# Patient Record
Sex: Female | Born: 1945 | Race: White | Hispanic: No | Marital: Married | Smoking: Never smoker
Health system: Southern US, Community
[De-identification: ages and names within clinical notes are randomized; demographics above are authoritative.]

## PROBLEM LIST (undated history)

## (undated) DIAGNOSIS — K5790 Diverticulosis of intestine, part unspecified, without perforation or abscess without bleeding: Secondary | ICD-10-CM

## (undated) DIAGNOSIS — I10 Essential (primary) hypertension: Secondary | ICD-10-CM

## (undated) DIAGNOSIS — M1990 Unspecified osteoarthritis, unspecified site: Secondary | ICD-10-CM

## (undated) DIAGNOSIS — R011 Cardiac murmur, unspecified: Secondary | ICD-10-CM

## (undated) DIAGNOSIS — K219 Gastro-esophageal reflux disease without esophagitis: Secondary | ICD-10-CM

## (undated) HISTORY — PX: KNEE ARTHROSCOPY: SHX127

## (undated) HISTORY — DX: Gastro-esophageal reflux disease without esophagitis: K21.9

## (undated) HISTORY — DX: Unspecified osteoarthritis, unspecified site: M19.90

## (undated) HISTORY — PX: DILATION AND CURETTAGE OF UTERUS: SHX78

## (undated) HISTORY — DX: Cardiac murmur, unspecified: R01.1

## (undated) HISTORY — DX: Essential (primary) hypertension: I10

## (undated) HISTORY — PX: BILATERAL CARPAL TUNNEL RELEASE: SHX6508

## (undated) HISTORY — PX: TONSILLECTOMY: SUR1361

---

## 2004-04-25 HISTORY — PX: ROTATOR CUFF REPAIR: SHX139

## 2004-09-14 ENCOUNTER — Ambulatory Visit: Payer: Self-pay | Admitting: Specialist

## 2004-11-15 ENCOUNTER — Ambulatory Visit: Payer: Self-pay | Admitting: Orthopedic Surgery

## 2004-12-16 ENCOUNTER — Ambulatory Visit: Payer: Self-pay | Admitting: Internal Medicine

## 2005-04-25 HISTORY — PX: FRACTURE SURGERY: SHX138

## 2005-07-15 ENCOUNTER — Emergency Department: Payer: Self-pay | Admitting: Emergency Medicine

## 2005-07-25 ENCOUNTER — Inpatient Hospital Stay: Payer: Self-pay | Admitting: Specialist

## 2005-07-25 ENCOUNTER — Other Ambulatory Visit: Payer: Self-pay

## 2005-10-30 ENCOUNTER — Emergency Department: Payer: Self-pay | Admitting: Emergency Medicine

## 2005-10-30 ENCOUNTER — Other Ambulatory Visit: Payer: Self-pay

## 2005-12-30 ENCOUNTER — Ambulatory Visit: Payer: Self-pay | Admitting: Specialist

## 2006-01-03 ENCOUNTER — Ambulatory Visit: Payer: Self-pay | Admitting: Internal Medicine

## 2006-02-01 ENCOUNTER — Ambulatory Visit: Payer: Self-pay | Admitting: Internal Medicine

## 2006-04-20 ENCOUNTER — Ambulatory Visit: Payer: Self-pay | Admitting: Specialist

## 2006-07-12 ENCOUNTER — Emergency Department: Payer: Self-pay | Admitting: Emergency Medicine

## 2007-02-06 ENCOUNTER — Ambulatory Visit: Payer: Self-pay | Admitting: Internal Medicine

## 2007-03-31 ENCOUNTER — Emergency Department: Payer: Self-pay | Admitting: Emergency Medicine

## 2007-03-31 ENCOUNTER — Other Ambulatory Visit: Payer: Self-pay

## 2007-11-05 ENCOUNTER — Ambulatory Visit: Payer: Self-pay | Admitting: Gastroenterology

## 2008-02-07 ENCOUNTER — Ambulatory Visit: Payer: Self-pay | Admitting: Internal Medicine

## 2008-09-22 ENCOUNTER — Ambulatory Visit: Payer: Self-pay | Admitting: Specialist

## 2008-09-30 ENCOUNTER — Ambulatory Visit: Payer: Self-pay | Admitting: Specialist

## 2008-10-09 ENCOUNTER — Ambulatory Visit: Payer: Self-pay | Admitting: Specialist

## 2009-02-09 ENCOUNTER — Ambulatory Visit: Payer: Self-pay | Admitting: Internal Medicine

## 2010-02-11 ENCOUNTER — Ambulatory Visit: Payer: Self-pay | Admitting: Internal Medicine

## 2010-02-15 ENCOUNTER — Ambulatory Visit: Payer: Self-pay

## 2010-02-17 ENCOUNTER — Ambulatory Visit: Payer: Self-pay | Admitting: Podiatry

## 2011-03-15 ENCOUNTER — Ambulatory Visit: Payer: Self-pay | Admitting: Internal Medicine

## 2012-03-15 ENCOUNTER — Ambulatory Visit: Payer: Self-pay | Admitting: Internal Medicine

## 2012-12-18 ENCOUNTER — Ambulatory Visit: Payer: Self-pay | Admitting: Gastroenterology

## 2013-03-18 ENCOUNTER — Ambulatory Visit: Payer: Self-pay | Admitting: Internal Medicine

## 2013-08-21 ENCOUNTER — Emergency Department: Payer: Self-pay | Admitting: Emergency Medicine

## 2013-08-21 LAB — BASIC METABOLIC PANEL
ANION GAP: 8 (ref 7–16)
BUN: 15 mg/dL (ref 7–18)
CHLORIDE: 103 mmol/L (ref 98–107)
Calcium, Total: 9.6 mg/dL (ref 8.5–10.1)
Co2: 28 mmol/L (ref 21–32)
Creatinine: 0.65 mg/dL (ref 0.60–1.30)
EGFR (Non-African Amer.): >60
GLUCOSE: 114 mg/dL — AB (ref 65–99)
Osmolality: 279 (ref 275–301)
POTASSIUM: 3.4 mmol/L — AB (ref 3.5–5.1)
Sodium: 139 mmol/L (ref 136–145)

## 2013-08-21 LAB — URINALYSIS, COMPLETE
BLOOD: NEGATIVE
Bacteria: NONE SEEN
Bilirubin,UR: NEGATIVE
Glucose,UR: NEGATIVE mg/dL (ref 0–75)
Hyaline Cast: 3
KETONE: NEGATIVE
Leukocyte Esterase: NEGATIVE
NITRITE: NEGATIVE
Ph: 5 (ref 4.5–8.0)
Protein: NEGATIVE
Specific Gravity: 1.015 (ref 1.003–1.030)

## 2013-08-21 LAB — CBC WITH DIFFERENTIAL/PLATELET
Comment - H1-Com1: NORMAL
Comment - H1-Com2: NORMAL
HCT: 42.2 % (ref 35.0–47.0)
HGB: 14.1 g/dL (ref 12.0–16.0)
LYMPHS PCT: 16 %
MCH: 29.4 pg (ref 26.0–34.0)
MCHC: 33.4 g/dL (ref 32.0–36.0)
MCV: 88 fL (ref 80–100)
MONOS PCT: 6 %
PLATELETS: 210 10*3/uL (ref 150–440)
RBC: 4.8 10*6/uL (ref 3.80–5.20)
RDW: 13.4 % (ref 11.5–14.5)
SEGMENTED NEUTROPHILS: 78 %
WBC: 11 10*3/uL (ref 3.6–11.0)

## 2013-08-21 LAB — D-DIMER(ARMC): D-Dimer: 671 ng/ml

## 2013-08-21 LAB — TROPONIN I: Troponin-I: <0.02 ng/mL

## 2014-01-29 ENCOUNTER — Ambulatory Visit: Payer: Self-pay | Admitting: Internal Medicine

## 2014-03-24 ENCOUNTER — Ambulatory Visit: Payer: Self-pay | Admitting: Internal Medicine

## 2014-08-04 DIAGNOSIS — E538 Deficiency of other specified B group vitamins: Secondary | ICD-10-CM | POA: Insufficient documentation

## 2014-08-13 ENCOUNTER — Ambulatory Visit
Admit: 2014-08-13 | Disposition: A | Discharge status: Home / Self Care | Payer: Self-pay | Attending: General Practice | Admitting: General Practice

## 2014-08-13 ENCOUNTER — Ambulatory Visit: Payer: Self-pay

## 2014-08-13 LAB — URINALYSIS, COMPLETE
BILIRUBIN, UR: NEGATIVE
Blood: NEGATIVE
Glucose,UR: NEGATIVE mg/dL (ref 0–75)
Ketone: NEGATIVE
Leukocyte Esterase: NEGATIVE
Nitrite: NEGATIVE
Ph: 5 (ref 4.5–8.0)
Protein: NEGATIVE
Specific Gravity: 1.017 (ref 1.003–1.030)

## 2014-08-13 LAB — BASIC METABOLIC PANEL
Anion Gap: 9 (ref 7–16)
BUN: 14 mg/dL
CHLORIDE: 99 mmol/L — AB
CO2: 30 mmol/L
Calcium, Total: 9.4 mg/dL
Creatinine: 0.69 mg/dL
GLUCOSE: 110 mg/dL — AB
Potassium: 3.8 mmol/L
SODIUM: 138 mmol/L

## 2014-08-13 LAB — SEDIMENTATION RATE: ERYTHROCYTE SED RATE: 49 mm/h — AB (ref 0–30)

## 2014-08-13 LAB — APTT: ACTIVATED PTT: 32 s (ref 23.6–35.9)

## 2014-08-13 LAB — CBC
HCT: 38 % (ref 35.0–47.0)
HGB: 12.8 g/dL (ref 12.0–16.0)
MCH: 28.8 pg (ref 26.0–34.0)
MCHC: 33.7 g/dL (ref 32.0–36.0)
MCV: 85 fL (ref 80–100)
Platelet: 267 10*3/uL (ref 150–440)
RBC: 4.45 10*6/uL (ref 3.80–5.20)
RDW: 13.7 % (ref 11.5–14.5)
WBC: 8.3 10*3/uL (ref 3.6–11.0)

## 2014-08-13 LAB — MRSA PCR SCREENING

## 2014-08-13 LAB — PROTIME-INR
INR: 0.9
Prothrombin Time: 12.8 secs

## 2014-08-14 LAB — URINE CULTURE

## 2014-08-24 ENCOUNTER — Ambulatory Visit: Payer: Self-pay | Admitting: Orthopedic Surgery

## 2014-08-24 HISTORY — PX: JOINT REPLACEMENT: SHX530

## 2014-08-24 MED ORDER — CLINDAMYCIN PHOSPHATE 900 MG/50ML IV SOLN: 900.0000 mg | INTRAVENOUS | Status: DC

## 2014-08-24 MED ORDER — TRANEXAMIC ACID 1000 MG/10ML IV SOLN
1000.0000 mg | INTRAVENOUS | Status: DC
  Filled 2014-08-24: qty 10

## 2014-08-25 ENCOUNTER — Encounter
Admission: RE | Disposition: A | Discharge status: Home / Self Care | Payer: Commercial Managed Care - HMO | Source: Ambulatory Visit | Attending: Orthopedic Surgery

## 2014-08-25 ENCOUNTER — Inpatient Hospital Stay: Payer: Commercial Managed Care - HMO

## 2014-08-25 ENCOUNTER — Ambulatory Visit: Payer: Self-pay | Admitting: Orthopedic Surgery

## 2014-08-25 ENCOUNTER — Inpatient Hospital Stay: Payer: Commercial Managed Care - HMO | Admitting: Anesthesiology

## 2014-08-25 ENCOUNTER — Encounter: Payer: Self-pay | Admitting: *Deleted

## 2014-08-25 ENCOUNTER — Inpatient Hospital Stay
Admission: RE | Admit: 2014-08-25 | Discharge: 2014-08-28 | DRG: 470 | Disposition: A | Discharge status: Home / Self Care | Payer: Commercial Managed Care - HMO | Source: Ambulatory Visit | Attending: Orthopedic Surgery | Admitting: Orthopedic Surgery

## 2014-08-25 DIAGNOSIS — I10 Essential (primary) hypertension: Secondary | ICD-10-CM | POA: Diagnosis present

## 2014-08-25 DIAGNOSIS — Z48812 Encounter for surgical aftercare following surgery on the circulatory system: Secondary | ICD-10-CM

## 2014-08-25 DIAGNOSIS — R011 Cardiac murmur, unspecified: Secondary | ICD-10-CM | POA: Diagnosis present

## 2014-08-25 DIAGNOSIS — Z888 Allergy status to other drugs, medicaments and biological substances status: Secondary | ICD-10-CM

## 2014-08-25 DIAGNOSIS — Z79899 Other long term (current) drug therapy: Secondary | ICD-10-CM

## 2014-08-25 DIAGNOSIS — E785 Hyperlipidemia, unspecified: Secondary | ICD-10-CM | POA: Diagnosis present

## 2014-08-25 DIAGNOSIS — E010 Iodine-deficiency related diffuse (endemic) goiter: Secondary | ICD-10-CM | POA: Diagnosis present

## 2014-08-25 DIAGNOSIS — Z8711 Personal history of peptic ulcer disease: Secondary | ICD-10-CM | POA: Diagnosis not present

## 2014-08-25 DIAGNOSIS — Z96651 Presence of right artificial knee joint: Secondary | ICD-10-CM

## 2014-08-25 DIAGNOSIS — M1711 Unilateral primary osteoarthritis, right knee: Secondary | ICD-10-CM | POA: Diagnosis present

## 2014-08-25 DIAGNOSIS — J3089 Other allergic rhinitis: Secondary | ICD-10-CM | POA: Diagnosis present

## 2014-08-25 DIAGNOSIS — Z7982 Long term (current) use of aspirin: Secondary | ICD-10-CM | POA: Diagnosis not present

## 2014-08-25 DIAGNOSIS — K219 Gastro-esophageal reflux disease without esophagitis: Secondary | ICD-10-CM | POA: Diagnosis present

## 2014-08-25 DIAGNOSIS — J45909 Unspecified asthma, uncomplicated: Secondary | ICD-10-CM | POA: Diagnosis present

## 2014-08-25 DIAGNOSIS — Z880 Allergy status to penicillin: Secondary | ICD-10-CM | POA: Diagnosis not present

## 2014-08-25 DIAGNOSIS — M179 Osteoarthritis of knee, unspecified: Secondary | ICD-10-CM | POA: Diagnosis present

## 2014-08-25 DIAGNOSIS — Z833 Family history of diabetes mellitus: Secondary | ICD-10-CM

## 2014-08-25 DIAGNOSIS — Z780 Asymptomatic menopausal state: Secondary | ICD-10-CM | POA: Diagnosis not present

## 2014-08-25 DIAGNOSIS — Z96659 Presence of unspecified artificial knee joint: Secondary | ICD-10-CM

## 2014-08-25 DIAGNOSIS — R05 Cough: Secondary | ICD-10-CM | POA: Diagnosis present

## 2014-08-25 DIAGNOSIS — Z8249 Family history of ischemic heart disease and other diseases of the circulatory system: Secondary | ICD-10-CM

## 2014-08-25 HISTORY — PX: TOTAL KNEE ARTHROPLASTY: SHX125

## 2014-08-25 SURGERY — ARTHROPLASTY, KNEE, TOTAL
Anesthesia: Spinal | Laterality: Right

## 2014-08-25 MED ORDER — FERROUS SULFATE 325 (65 FE) MG PO TABS
325.0000 mg | ORAL_TABLET | Freq: Two times a day (BID) | ORAL | Status: DC
  Administered 2014-08-26 – 2014-08-28 (×5): 325 mg via ORAL
  Filled 2014-08-25 (×5): qty 1

## 2014-08-25 MED ORDER — MORPHINE SULFATE 2 MG/ML IJ SOLN: 2.0000 mg | INTRAMUSCULAR | Status: DC | PRN

## 2014-08-25 MED ORDER — TRANEXAMIC ACID 1000 MG/10ML IV SOLN: 1000.0000 mg | Freq: Once | INTRAVENOUS | Status: DC

## 2014-08-25 MED ORDER — TETRACAINE HCL 1 % IJ SOLN
INTRAMUSCULAR | Status: DC | PRN
  Administered 2014-08-25: 12 mg via INTRASPINAL

## 2014-08-25 MED ORDER — MAGNESIUM HYDROXIDE 400 MG/5ML PO SUSP
30.0000 mL | Freq: Every day | ORAL | Status: DC | PRN
  Administered 2014-08-26: 30 mL via ORAL
  Filled 2014-08-25: qty 30

## 2014-08-25 MED ORDER — MAGNESIUM HYDROXIDE 400 MG/5ML PO SUSP: 30.0000 mL | Freq: Every day | ORAL | Status: DC | PRN

## 2014-08-25 MED ORDER — LORATADINE 10 MG PO TABS: 10.0000 mg | ORAL_TABLET | Freq: Every day | ORAL | Status: DC

## 2014-08-25 MED ORDER — PHENOL 1.4 % MT LIQD: 1.0000 | OROMUCOSAL | Status: DC | PRN

## 2014-08-25 MED ORDER — ACETAMINOPHEN 325 MG PO TABS
650.0000 mg | ORAL_TABLET | Freq: Four times a day (QID) | ORAL | Status: DC | PRN
  Administered 2014-08-27: 650 mg via ORAL
  Filled 2014-08-25: qty 2

## 2014-08-25 MED ORDER — HYDROMORPHONE HCL 1 MG/ML IJ SOLN
INTRAMUSCULAR | Status: AC
  Administered 2014-08-25: 0.4 mg
  Filled 2014-08-25: qty 1

## 2014-08-25 MED ORDER — PROPOFOL INFUSION 10 MG/ML OPTIME
INTRAVENOUS | Status: DC | PRN
  Administered 2014-08-25: 100 ug/kg/min via INTRAVENOUS

## 2014-08-25 MED ORDER — ENOXAPARIN SODIUM 30 MG/0.3ML ~~LOC~~ SOLN: 30.0000 mg | Freq: Two times a day (BID) | SUBCUTANEOUS | Status: DC

## 2014-08-25 MED ORDER — SODIUM CHLORIDE 0.9 % IV SOLN: 1000.0000 mg | Freq: Once | INTRAVENOUS | Status: DC

## 2014-08-25 MED ORDER — SENNA 8.6 MG PO TABS
1.0000 | ORAL_TABLET | Freq: Two times a day (BID) | ORAL | Status: DC
  Administered 2014-08-25 – 2014-08-28 (×4): 8.6 mg via ORAL
  Filled 2014-08-25 (×6): qty 1

## 2014-08-25 MED ORDER — CLINDAMYCIN PHOSPHATE 900 MG/50ML IV SOLN: 900.0000 mg | INTRAVENOUS | Status: DC

## 2014-08-25 MED ORDER — SODIUM CHLORIDE 0.9 % IV SOLN
INTRAVENOUS | Status: DC | PRN
  Administered 2014-08-25: 60 mL

## 2014-08-25 MED ORDER — SODIUM CHLORIDE 0.9 % IJ SOLN: INTRAMUSCULAR | Status: DC | PRN

## 2014-08-25 MED ORDER — METOCLOPRAMIDE HCL 5 MG/ML IJ SOLN
10.0000 mg | Freq: Three times a day (TID) | INTRAMUSCULAR | Status: DC
  Administered 2014-08-27: 10 mg via INTRAVENOUS
  Filled 2014-08-25 (×2): qty 2

## 2014-08-25 MED ORDER — METOCLOPRAMIDE HCL 10 MG PO TABS
10.0000 mg | ORAL_TABLET | Freq: Three times a day (TID) | ORAL | Status: DC
  Administered 2014-08-25 – 2014-08-28 (×9): 10 mg via ORAL
  Filled 2014-08-25 (×10): qty 1

## 2014-08-25 MED ORDER — CLINDAMYCIN PHOSPHATE 900 MG/50ML IV SOLN
INTRAVENOUS | Status: AC
  Administered 2014-08-25: 900 mg via INTRAVENOUS
  Filled 2014-08-25: qty 50

## 2014-08-25 MED ORDER — CLINDAMYCIN PHOSPHATE 600 MG/50ML IV SOLN: 600.0000 mg | Freq: Four times a day (QID) | INTRAVENOUS | Status: DC

## 2014-08-25 MED ORDER — HYDROMORPHONE HCL 1 MG/ML IJ SOLN
0.2500 mg | INTRAMUSCULAR | Status: DC | PRN
  Administered 2014-08-25 (×4): 0.25 mg via INTRAVENOUS

## 2014-08-25 MED ORDER — TRAMADOL HCL 50 MG PO TABS
50.0000 mg | ORAL_TABLET | ORAL | Status: DC | PRN
  Administered 2014-08-26 – 2014-08-27 (×2): 50 mg via ORAL
  Filled 2014-08-25 (×2): qty 1

## 2014-08-25 MED ORDER — FENTANYL CITRATE (PF) 100 MCG/2ML IJ SOLN
25.0000 ug | INTRAMUSCULAR | Status: DC | PRN
  Administered 2014-08-25 (×4): 25 ug via INTRAVENOUS

## 2014-08-25 MED ORDER — HYDROMORPHONE HCL 1 MG/ML IJ SOLN
0.5000 mg | INTRAMUSCULAR | Status: DC | PRN
  Administered 2014-08-25: 0.5 mg via INTRAVENOUS

## 2014-08-25 MED ORDER — BUPIVACAINE-EPINEPHRINE 0.25% -1:200000 IJ SOLN
INTRAMUSCULAR | Status: DC | PRN
  Administered 2014-08-25: 30 mL

## 2014-08-25 MED ORDER — ACETAMINOPHEN 650 MG RE SUPP: 650.0000 mg | Freq: Four times a day (QID) | RECTAL | Status: DC | PRN

## 2014-08-25 MED ORDER — FERROUS SULFATE 325 (65 FE) MG PO TABS: 325.0000 mg | ORAL_TABLET | Freq: Two times a day (BID) | ORAL | Status: DC

## 2014-08-25 MED ORDER — SODIUM CHLORIDE 0.9 % IJ SOLN
INTRAMUSCULAR | Status: AC
Start: 2014-08-25 — End: 2014-08-25
  Filled 2014-08-25: qty 3

## 2014-08-25 MED ORDER — ONDANSETRON HCL 4 MG/2ML IJ SOLN: 4.0000 mg | Freq: Once | INTRAMUSCULAR | Status: DC | PRN

## 2014-08-25 MED ORDER — SODIUM CHLORIDE 0.9 % IV SOLN: INTRAVENOUS | Status: DC

## 2014-08-25 MED ORDER — ONDANSETRON HCL 4 MG PO TABS: 4.0000 mg | ORAL_TABLET | Freq: Four times a day (QID) | ORAL | Status: DC | PRN

## 2014-08-25 MED ORDER — BISACODYL 10 MG RE SUPP: 10.0000 mg | Freq: Every day | RECTAL | Status: DC | PRN

## 2014-08-25 MED ORDER — LACTATED RINGERS IV SOLN
INTRAVENOUS | Status: DC
  Administered 2014-08-25 (×2): via INTRAVENOUS

## 2014-08-25 MED ORDER — ACETAMINOPHEN 10 MG/ML IV SOLN
INTRAVENOUS | Status: DC | PRN
  Administered 2014-08-25: 1000 mg via INTRAVENOUS

## 2014-08-25 MED ORDER — ENOXAPARIN SODIUM 30 MG/0.3ML ~~LOC~~ SOLN
30.0000 mg | Freq: Two times a day (BID) | SUBCUTANEOUS | Status: DC
  Administered 2014-08-25 – 2014-08-28 (×6): 30 mg via SUBCUTANEOUS
  Filled 2014-08-25 (×6): qty 0.3

## 2014-08-25 MED ORDER — METOCLOPRAMIDE HCL 5 MG/ML IJ SOLN: 10.0000 mg | Freq: Three times a day (TID) | INTRAMUSCULAR | Status: DC

## 2014-08-25 MED ORDER — FLEET ENEMA 7-19 GM/118ML RE ENEM
1.0000 | ENEMA | Freq: Once | RECTAL | Status: DC | PRN
Start: 2014-08-25 — End: 2014-08-25

## 2014-08-25 MED ORDER — ACETAMINOPHEN 325 MG PO TABS: 650.0000 mg | ORAL_TABLET | Freq: Four times a day (QID) | ORAL | Status: DC | PRN

## 2014-08-25 MED ORDER — ACETAMINOPHEN 10 MG/ML IV SOLN: 1000.0000 mg | Freq: Four times a day (QID) | INTRAVENOUS | Status: DC

## 2014-08-25 MED ORDER — SODIUM CHLORIDE 0.9 % IV SOLN
INTRAVENOUS | Status: DC
  Administered 2014-08-25 – 2014-08-26 (×2): via INTRAVENOUS

## 2014-08-25 MED ORDER — LIDOCAINE HCL (PF) 1 % IJ SOLN
INTRAMUSCULAR | Status: AC
  Filled 2014-08-25: qty 2

## 2014-08-25 MED ORDER — PANTOPRAZOLE SODIUM 40 MG PO TBEC
40.0000 mg | DELAYED_RELEASE_TABLET | Freq: Every day | ORAL | Status: DC
  Administered 2014-08-25 – 2014-08-28 (×4): 40 mg via ORAL
  Filled 2014-08-25 (×4): qty 1

## 2014-08-25 MED ORDER — CALCIUM CITRATE-VITAMIN D 315-200 MG-UNIT PO TABS: 1.0000 | ORAL_TABLET | Freq: Three times a day (TID) | ORAL | Status: DC

## 2014-08-25 MED ORDER — LORATADINE 10 MG PO TABS
10.0000 mg | ORAL_TABLET | Freq: Every day | ORAL | Status: DC
  Administered 2014-08-25 – 2014-08-28 (×4): 10 mg via ORAL
  Filled 2014-08-25 (×4): qty 1

## 2014-08-25 MED ORDER — ALUM & MAG HYDROXIDE-SIMETH 200-200-20 MG/5ML PO SUSP: 30.0000 mL | ORAL | Status: DC | PRN

## 2014-08-25 MED ORDER — MENTHOL 3 MG MT LOZG: 1.0000 | LOZENGE | OROMUCOSAL | Status: DC | PRN

## 2014-08-25 MED ORDER — ACETAMINOPHEN 10 MG/ML IV SOLN
1000.0000 mg | Freq: Four times a day (QID) | INTRAVENOUS | Status: AC
  Administered 2014-08-25 – 2014-08-26 (×4): 1000 mg via INTRAVENOUS
  Filled 2014-08-25 (×6): qty 100

## 2014-08-25 MED ORDER — NEOMYCIN-POLYMYXIN B GU 40-200000 IR SOLN
Status: DC | PRN
  Administered 2014-08-25: 16 mL

## 2014-08-25 MED ORDER — TRANEXAMIC ACID 1000 MG/10ML IV SOLN
1000.0000 mg | INTRAVENOUS | Status: DC | PRN
  Administered 2014-08-25: 1000 mg via INTRAVENOUS

## 2014-08-25 MED ORDER — TRANEXAMIC ACID 1000 MG/10ML IV SOLN
1000.0000 mg | Freq: Once | INTRAVENOUS | Status: AC
  Administered 2014-08-25: 1000 mg via INTRAVENOUS
  Filled 2014-08-25: qty 10

## 2014-08-25 MED ORDER — CHLORHEXIDINE GLUCONATE 4 % EX LIQD
60.0000 mL | Freq: Once | CUTANEOUS | Status: DC
  Filled 2014-08-25: qty 60

## 2014-08-25 MED ORDER — CALCIUM CARBONATE-VITAMIN D 500-200 MG-UNIT PO TABS
1.0000 | ORAL_TABLET | Freq: Three times a day (TID) | ORAL | Status: DC
  Administered 2014-08-25 – 2014-08-28 (×8): 1 via ORAL
  Filled 2014-08-25 (×8): qty 1

## 2014-08-25 MED ORDER — METOCLOPRAMIDE HCL 10 MG PO TABS: 10.0000 mg | ORAL_TABLET | Freq: Three times a day (TID) | ORAL | Status: DC

## 2014-08-25 MED ORDER — CHLORHEXIDINE GLUCONATE 4 % EX LIQD: 60.0000 mL | Freq: Once | CUTANEOUS | Status: DC

## 2014-08-25 MED ORDER — OXYCODONE HCL 5 MG PO TABS
5.0000 mg | ORAL_TABLET | ORAL | Status: DC | PRN
Start: 2014-08-25 — End: 2014-08-25

## 2014-08-25 MED ORDER — ONDANSETRON HCL 4 MG/2ML IJ SOLN
4.0000 mg | Freq: Four times a day (QID) | INTRAMUSCULAR | Status: DC | PRN
  Administered 2014-08-25: 4 mg via INTRAVENOUS
  Filled 2014-08-25: qty 2

## 2014-08-25 MED ORDER — OXYCODONE HCL 5 MG PO TABS
5.0000 mg | ORAL_TABLET | ORAL | Status: DC | PRN
  Administered 2014-08-25 – 2014-08-28 (×7): 5 mg via ORAL
  Filled 2014-08-25 (×6): qty 1
  Filled 2014-08-25: qty 2

## 2014-08-25 MED ORDER — HYDROCHLOROTHIAZIDE 25 MG PO TABS
25.0000 mg | ORAL_TABLET | Freq: Every day | ORAL | Status: DC
Start: 2014-08-25 — End: 2014-08-28
  Administered 2014-08-25 – 2014-08-28 (×4): 25 mg via ORAL
  Filled 2014-08-25 (×4): qty 1

## 2014-08-25 MED ORDER — MECLIZINE HCL 25 MG PO TABS: 25.0000 mg | ORAL_TABLET | Freq: Three times a day (TID) | ORAL | Status: DC | PRN

## 2014-08-25 MED ORDER — TRAMADOL HCL 50 MG PO TABS: 50.0000 mg | ORAL_TABLET | Freq: Four times a day (QID) | ORAL | Status: DC | PRN

## 2014-08-25 MED ORDER — METOPROLOL SUCCINATE ER 25 MG PO TB24
25.0000 mg | ORAL_TABLET | Freq: Every day | ORAL | Status: DC
  Administered 2014-08-25 – 2014-08-28 (×4): 25 mg via ORAL
  Filled 2014-08-25 (×4): qty 1

## 2014-08-25 MED ORDER — ACETAMINOPHEN 10 MG/ML IV SOLN
INTRAVENOUS | Status: DC | PRN
Start: 2014-08-25 — End: 2014-08-25
  Administered 2014-08-25: 1000 mg via INTRAVENOUS

## 2014-08-25 MED ORDER — VITAMIN B-12 500 MCG PO TABS
500.0000 ug | ORAL_TABLET | Freq: Two times a day (BID) | ORAL | Status: DC
  Administered 2014-08-25 – 2014-08-28 (×6): 500 ug via ORAL
  Filled 2014-08-25 (×7): qty 1

## 2014-08-25 MED ORDER — FLAX SEED OIL 1000 MG PO CAPS: 1.0000 | ORAL_CAPSULE | Freq: Three times a day (TID) | ORAL | Status: DC

## 2014-08-25 MED ORDER — ONDANSETRON HCL 4 MG/2ML IJ SOLN: 4.0000 mg | Freq: Four times a day (QID) | INTRAMUSCULAR | Status: DC | PRN

## 2014-08-25 MED ORDER — CLINDAMYCIN PHOSPHATE 900 MG/50ML IV SOLN
900.0000 mg | Freq: Three times a day (TID) | INTRAVENOUS | Status: AC
  Administered 2014-08-25 – 2014-08-26 (×3): 900 mg via INTRAVENOUS
  Filled 2014-08-25 (×5): qty 50

## 2014-08-25 MED ORDER — FLEET ENEMA 7-19 GM/118ML RE ENEM: 1.0000 | ENEMA | Freq: Once | RECTAL | Status: AC | PRN

## 2014-08-25 MED ORDER — SENNA 8.6 MG PO TABS: 1.0000 | ORAL_TABLET | Freq: Two times a day (BID) | ORAL | Status: DC

## 2014-08-25 MED ORDER — PHENOL 1.4 % MT LIQD
1.0000 | OROMUCOSAL | Status: DC | PRN
  Filled 2014-08-25: qty 177

## 2014-08-25 SURGICAL SUPPLY — 63 items
AUTOTRANSFUS HAS 1/8 (MISCELLANEOUS) ×3
BATTERY INSTRU NAVIGATION (MISCELLANEOUS) ×12 IMPLANT
BLADE SAW 1 (BLADE) ×3 IMPLANT
BLADE SAW 1/2 (BLADE) ×3 IMPLANT
BTRY SRG DRVR LF (MISCELLANEOUS) ×4
CANISTER SUCT 1200ML W/VALVE (MISCELLANEOUS) ×3 IMPLANT
CANISTER SUCT 3000ML (MISCELLANEOUS) ×6 IMPLANT
CAP KNEE TOTAL 3 SIGMA ×2 IMPLANT
CATH TRAY 16F METER LATEX (MISCELLANEOUS) ×3 IMPLANT
CEMENT HV SMART SET (Cement) ×6 IMPLANT
COOLER POLAR GLACIER W/PUMP (MISCELLANEOUS) ×3 IMPLANT
DRAPE SHEET LG 3/4 BI-LAMINATE (DRAPES) ×5 IMPLANT
DRSG DERMACEA 8X12 NADH (GAUZE/BANDAGES/DRESSINGS) ×3 IMPLANT
DRSG OPSITE POSTOP 4X10 (GAUZE/BANDAGES/DRESSINGS) ×2 IMPLANT
DRSG OPSITE POSTOP 4X12 (GAUZE/BANDAGES/DRESSINGS) ×3 IMPLANT
DRSG OPSITE POSTOP 4X14 (GAUZE/BANDAGES/DRESSINGS) ×3 IMPLANT
DURAPREP 26ML APPLICATOR (WOUND CARE) ×6 IMPLANT
EX-PIN ORTHOLOCK NAV 4X150 (PIN) ×6 IMPLANT
GLOVE BIOGEL M STRL SZ7.5 (GLOVE) ×6 IMPLANT
GLOVE INDICATOR 8.0 STRL GRN (GLOVE) ×3 IMPLANT
GLOVE SURG 9.0 ORTHO LTXF (GLOVE) ×3 IMPLANT
GLOVE SURG ORTHO 9.0 STRL STRW (GLOVE) ×3 IMPLANT
GOWN STRL REUS W/ TWL LRG LVL3 (GOWN DISPOSABLE) ×1 IMPLANT
GOWN STRL REUS W/TWL 2XL LVL3 (GOWN DISPOSABLE) ×3 IMPLANT
GOWN STRL REUS W/TWL LRG LVL3 (GOWN DISPOSABLE) ×3
GOWN STRL REUS W/TWL XL LVL4 (GOWN DISPOSABLE) ×3 IMPLANT
HANDPIECE SUCTION TUBG SURGILV (MISCELLANEOUS) ×3 IMPLANT
HOLDER FOLEY CATH W/STRAP (MISCELLANEOUS) ×3 IMPLANT
HOOD PEEL AWAY FACE SHEILD DIS (HOOD) ×6 IMPLANT
KNIFE SCULPS 14X20 (INSTRUMENTS) ×3 IMPLANT
NDL SAFETY 18GX1.5 (NEEDLE) ×3 IMPLANT
NDL SPNL 18GX3.5 QUINCKE PK (NEEDLE) ×1 IMPLANT
NDL SPNL 20GX3.5 QUINCKE YW (NEEDLE) ×1 IMPLANT
NEEDLE SPNL 18GX3.5 QUINCKE PK (NEEDLE) ×3 IMPLANT
NEEDLE SPNL 20GX3.5 QUINCKE YW (NEEDLE) ×3 IMPLANT
NS IRRIG 500ML POUR BTL (IV SOLUTION) ×3 IMPLANT
PACK TOTAL KNEE (MISCELLANEOUS) ×3 IMPLANT
PAD CAST CTTN 4X4 STRL (SOFTGOODS) IMPLANT
PAD GROUND ADULT SPLIT (MISCELLANEOUS) ×3 IMPLANT
PAD WRAPON POLAR KNEE (MISCELLANEOUS) ×1 IMPLANT
PADDING CAST COTTON 4X4 STRL (SOFTGOODS) ×3
PIN FIXATION 1/8DIA X 3INL (PIN) ×3 IMPLANT
SOL .9 NS 3000ML IRR  AL (IV SOLUTION) ×2
SOL .9 NS 3000ML IRR AL (IV SOLUTION) ×1
SOL .9 NS 3000ML IRR UROMATIC (IV SOLUTION) ×1 IMPLANT
SOL PREP PVP 2OZ (MISCELLANEOUS) ×3
SOLUTION PREP PVP 2OZ (MISCELLANEOUS) ×1 IMPLANT
SPONGE DRAIN TRACH 4X4 STRL 2S (GAUZE/BANDAGES/DRESSINGS) ×3 IMPLANT
STAPLER SKIN PROX 35W (STAPLE) ×3 IMPLANT
STOCKINETTE BIAS CUT 6 980064 (GAUZE/BANDAGES/DRESSINGS) ×2 IMPLANT
STRAP SAFETY BODY (MISCELLANEOUS) ×3 IMPLANT
SUCTION FRAZIER TIP 10 FR DISP (SUCTIONS) ×3 IMPLANT
SUT VIC AB 0 CT1 36 (SUTURE) ×3 IMPLANT
SUT VIC AB 1 CT1 36 (SUTURE) ×6 IMPLANT
SUT VIC AB 2-0 CT2 27 (SUTURE) ×3 IMPLANT
SYR 20CC LL (SYRINGE) ×3 IMPLANT
SYR 30ML LL (SYRINGE) ×3 IMPLANT
SYR 50ML LL SCALE MARK (SYRINGE) ×3 IMPLANT
SYSTEM AUTOTRANSFUS DUAL TROCR (MISCELLANEOUS) ×1 IMPLANT
TOWEL OR 17X26 4PK STRL BLUE (TOWEL DISPOSABLE) ×3 IMPLANT
TOWER CARTRIDGE SMART MIX (DISPOSABLE) ×3 IMPLANT
WATER STERILE IRR 1000ML POUR (IV SOLUTION) ×3 IMPLANT
WRAPON POLAR PAD KNEE (MISCELLANEOUS) ×3

## 2014-08-25 NOTE — Brief Op Note (Signed)
08/25/2014  3:43 PM  PATIENT:  Nancy York  69 y.o. female  PRE-OPERATIVE DIAGNOSIS:  degenerative arthrosis right knee  POST-OPERATIVE DIAGNOSIS:  degenerative arthrosis right knee  PROCEDURE:  Procedure(s): TOTAL KNEE ARTHROPLASTY (Right)  SURGEON:  Surgeon(s) and Role:    * Donato HeinzJames P Leeba Barbe, MD - Primary  PHYSICIAN ASSISTANT: Van ClinesJon Wolfe, PA   ANESTHESIA:   spinal  EBL:  Total I/O In: 2000 [I.V.:2000] Out: 530 [Urine:430; Blood:100]  BLOOD ADMINISTERED:none  DRAINS: 2 drains to reinfusion system   LOCAL MEDICATIONS USED:  MARCAINE 0.25% with epi 30 ml   Exparel 20 ml in 40 ml of NS     SPECIMEN:  No Specimen  DISPOSITION OF SPECIMEN:  N/A  COUNTS:  YES  TOURNIQUET:   Total Tourniquet Time Documented: Thigh (Right) - 93 minutes Total: Thigh (Right) - 93 minutes   DICTATION: .Reubin Milanragon Dictation  PLAN OF CARE: Admit to inpatient   PATIENT DISPOSITION:  PACU - hemodynamically stable.   Delay start of Pharmacological VTE agent (>24hrs) due to surgical blood loss or risk of bleeding: yes

## 2014-08-25 NOTE — Anesthesia Postprocedure Evaluation (Signed)
  Anesthesia Post-op Note  Patient: Nancy BlakeSandra S York  Procedure(s) Performed: Procedure(s): TOTAL KNEE ARTHROPLASTY (Right)  Anesthesia type:Spinal  Patient location: PACU  Post pain: Pain level controlled  Post assessment: Post-op Vital signs reviewed, Patient's Cardiovascular Status Stable, Respiratory Function Stable, Patent Airway and No signs of Nausea or vomiting  Post vital signs: Reviewed and stable  Last Vitals:  Filed Vitals:   08/25/14 1100  BP: 170/85  Pulse: 78  Temp: 36.9 C  Resp: 20    Level of consciousness: awake, alert  and patient cooperative  Complications: No apparent anesthesia complications

## 2014-08-25 NOTE — Anesthesia Postprocedure Evaluation (Signed)
  Anesthesia Post-op Note  Patient: Nancy York  Procedure(s) Performed: Procedure(s): TOTAL KNEE ARTHROPLASTY (Right)  Anesthesia type:Spinal  Patient location: PACU  Post pain: Pain level controlled  Post assessment: Post-op Vital signs reviewed, Patient's Cardiovascular Status Stable, Respiratory Function Stable, Patent Airway and No signs of Nausea or vomiting  Post vital signs: Reviewed and stable  Last Vitals:  Filed Vitals:   08/25/14 1100  BP: 170/85  Pulse: 78  Temp: 36.9 C  Resp: 20    Level of consciousness: awake, alert  and patient cooperative  Complications: No apparent anesthesia complications  

## 2014-08-25 NOTE — OR Nursing (Signed)
EBL 100ml

## 2014-08-25 NOTE — Transfer of Care (Signed)
Immediate Anesthesia Transfer of Care Note  Patient: Nancy York  Procedure(s) Performed: Procedure(s): TOTAL KNEE ARTHROPLASTY (Right)  Patient Location: PACU  Anesthesia Type:Spinal  Level of Consciousness: sedated  Airway & Oxygen Therapy: Patient Spontanous Breathing and Patient connected to face mask oxygen  Post-op Assessment: Report given to RN and Post -op Vital signs reviewed and stable  Post vital signs: Reviewed and stable  Last Vitals:  Filed Vitals:   08/25/14 1100  BP: 170/85  Pulse: 78  Temp: 36.9 C  Resp: 20    Complications: No apparent anesthesia complications

## 2014-08-25 NOTE — H&P (Signed)
The patient has been re-examined, and the chart reviewed, and there have been no interval changes to the documented history and physical.    The risks, benefits, and alternatives have been discussed at length, and the patient is willing to proceed.   

## 2014-08-25 NOTE — Interval H&P Note (Signed)
History and Physical Interval Note:  08/25/2014 11:54 AM  Nancy York  has presented today for surgery, with the diagnosis of oa  The various methods of treatment have been discussed with the patient and family. After consideration of risks, benefits and other options for treatment, the patient has consented to  Procedure(s): TOTAL KNEE ARTHROPLASTY (Right) as a surgical intervention .  The patient's history has been reviewed, patient examined, no change in status, stable for surgery.  I have reviewed the patient's chart and labs.  Questions were answered to the patient's satisfaction.     HOOTEN,JAMES P

## 2014-08-25 NOTE — Anesthesia Procedure Notes (Addendum)
Spinal Patient location during procedure: pre-op Start time: 08/25/2014 12:20 PM End time: 08/25/2014 12:24 PM Staffing Anesthesiologist: Elijio MilesVAN STAVEREN, Elowyn Raupp F Performed by: anesthesiologist  Preanesthetic Checklist Completed: patient identified, surgical consent, pre-op evaluation, timeout performed, IV checked, risks and benefits discussed and monitors and equipment checked Spinal Block Patient position: sitting Prep: Betadine and site prepped and draped Patient monitoring: heart rate, continuous pulse ox and blood pressure Approach: midline Location: L3-4 Injection technique: single-shot Needle Needle type: Quincke  Needle gauge: 25 G Needle length: 9 cm Assessment Sensory level: T6  Anesthesia Procedure Note  Tourniquet up at 1302 R Thigh @300mmhg 

## 2014-08-25 NOTE — Progress Notes (Signed)
Spoke with DR. Poggi two clindaymycin orders. md order to continue with clindaymycin 900mg  iv and d/c clindamycin 600mg . Sheron NightingaleMary Layaan Mott RN

## 2014-08-25 NOTE — Addendum Note (Signed)
Addendum  created 08/25/14 1607 by Lezlie OctaveGijsbertus F Van Staveren, MD   Modules edited: Anesthesia Attestations, Notes Section   Notes Section:  File: [id number]

## 2014-08-25 NOTE — Progress Notes (Signed)
PHARMACIST - PHYSICIAN ORDER COMMUNICATION  CONCERNING: P&T Medication Policy on Herbal Medications  DESCRIPTION:  This patient's order for: flaxseed oil has been noted.  This product(s) is classified as an "herbal" or natural product. Due to a lack of definitive safety studies or FDA approval, nonstandard manufacturing practices, plus the potential risk of unknown drug-drug interactions while on inpatient medications, the Pharmacy and Therapeutics Committee does not permit the use of "herbal" or natural products of this type within [facility].   ACTION TAKEN: The pharmacy department is unable to verify this order at this time and your patient has been informed of this safety policy. Please reevaluate patient's clinical condition at discharge and address if the herbal or natural product(s) should be resumed at that time.  

## 2014-08-25 NOTE — Anesthesia Preprocedure Evaluation (Addendum)
Anesthesia Evaluation  Patient identified by MRN, date of birth, ID band Patient awake    History of Anesthesia Complications Negative for: history of anesthetic complications  Airway Mallampati: II       Dental no notable dental hx.    Pulmonary neg pulmonary ROS,    Pulmonary exam normal       Cardiovascular hypertension, + CABG     Neuro/Psych negative neurological ROS  negative psych ROS   GI/Hepatic Neg liver ROS, GERD-  ,  Endo/Other  negative endocrine ROS  Renal/GU negative Renal ROS  negative genitourinary   Musculoskeletal negative musculoskeletal ROS (+)   Abdominal Normal abdominal exam  (+)   Peds negative pediatric ROS (+)  Hematology negative hematology ROS (+)   Anesthesia Other Findings   Reproductive/Obstetrics negative OB ROS                         Anesthesia Physical Anesthesia Plan  ASA: II  Anesthesia Plan: Spinal   Post-op Pain Management: MAC Combined w/ Regional for Post-op pain   Induction: Intravenous  Airway Management Planned: Natural Airway  Additional Equipment:   Intra-op Plan:   Post-operative Plan:   Informed Consent: I have reviewed the patients History and Physical, chart, labs and discussed the procedure including the risks, benefits and alternatives for the proposed anesthesia with the patient or authorized representative who has indicated his/her understanding and acceptance.     Plan Discussed with: CRNA and Surgeon  Anesthesia Plan Comments:         Anesthesia Quick Evaluation

## 2014-08-25 NOTE — Op Note (Signed)
DATE OF SURGERY:  08/25/2014  PATIENT NAME:  Nancy York   AGE: 69 y.o.  MRN: [id number]  PRE-OPERATIVE DIAGNOSIS: Degenerative arthrosis of the right knee, primary  POST-OPERATIVE DIAGNOSIS:  Same  PROCEDURE:  Right total knee arthroplasty using computer-assisted navigation  SURGEON:  Jena Gauss. M.D.  ASSISTANT:  Van Clines, PA (present and scrubbed throughout the case, critical for assistance with exposure, retraction, instrumentation, and closure)  ANESTHESIA: Spinal  ESTIMATED BLOOD LOSS: 100 mL  FLUIDS REPLACED: 2300 mL of crystalloid  TOURNIQUET TIME: 93 minutes  DRAINS: 2 medium drains to a reinfusion system  SOFT TISSUE RELEASES: Anterior cruciate ligament, posterior cruciate ligament, deep medial collateral ligament, patellofemoral ligament   IMPLANTS UTILIZED: DePuy PFC Sigma size 3 posterior stabilized femoral component (cemented), size 3 MBT tibial component (cemented), 35 mm 3 peg oval dome patella (cemented), and a 10 mm stabilized rotating platform polyethylene insert.  INDICATIONS FOR SURGERY: Nancy York is a 69 y.o. year old female with a long history of progressive knee pain. X-rays demonstrated severe degenerative changes in tricompartmental fashion. He had not seen any significant improvement despite conservative nonsurgical intervention. After discussion of the risks and benefits of surgical intervention, the patient expressed understanding of the risks benefits and agree with plans for total knee arthroplasty.   The risks, benefits, and alternatives were discussed at length including but not limited to the risks of infection, bleeding, nerve injury, stiffness, blood clots, the need for revision surgery, cardiopulmonary complications, among others, and they were willing to proceed.  PROCEDURE IN DETAIL: The patient was brought into the operating room and, after adequate spinal anesthesia was achieved, a tourniquet was placed on the patient's upper  thigh. The patient's knee and leg were cleaned and prepped with alcohol and DuraPrep and draped in the usual sterile fashion. A "timeout" was performed as per usual protocol. The lower extremity was exsanguinated using an Esmarch, and the tourniquet was inflated to 300 mmHg. An anterior longitudinal incision was made followed by a standard mid vastus approach. The deep fibers of the medial collateral ligament were elevated in a subperiosteal fashion off of the medial flare of the tibia so as to maintain a continuous soft tissue sleeve. The patella was subluxed laterally and the patellofemoral ligament was incised. Inspection of the knee demonstrated severe degenerative changes with full-thickness loss of articular cartilage. Osteophytes were debrided using a rongeur. Anterior and posterior cruciate ligaments were excised. Two 4.0 mm Schanz pins were inserted in the femur and into the tibia for attachment of the array of trackers used for computer-assisted navigation. Hip center was identified using a circumduction technique. Distal landmarks were mapped using the computer. The distal femur and proximal tibia were mapped using the computer. The distal femoral cutting guide was positioned using computer-assisted navigation so as to achieve a 5 distal valgus cut. The femur was sized and it was felt that a size 3 femoral component was appropriate. A size 3 femoral cutting guide was positioned and the anterior cut was performed and verified using the computer. This was followed by completion of the posterior and chamfer cuts. Femoral cutting guide for the central box was then positioned in the center box cut was performed.  Attention was then directed to the proximal tibia. Medial and lateral menisci were excised. The extramedullary tibial cutting guide was positioned using computer-assisted navigation so as to achieve a 0 varus-valgus alignment and 0 posterior slope. The cut was performed and verified using the  computer.  The proximal tibia was sized and it was felt that a size 3 tibial tray was appropriate. Tibial and femoral trials were inserted followed by insertion of a 10 mm polyethylene insert. The knee was felt to be tight in both flexion and extension. The trial components were removed and the cutting guide was repositioned so as to resect an additional 2 mm of bone from the proximal tibia. Tibial and femoral trials were reinserted with the 10 mm polyethylene trial.This allowed for excellent mediolateral soft tissue balancing both in flexion and in full extension. Finally, the patella was cut and repaired so as to accommodate a 35 mm 3 peg oval dome patella. A patellar trial was placed and the knee was placed through a range of motion with excellent patellar tracking appreciated. The femoral trial was removed after debridement of posterior osteophytes. The central post-hole for the tibial component was reamed followed by insertion of a keel punch. Tibial trials were then removed. Cut surfaces of bone were irrigated with copious amounts of normal saline with antibiotic solution using pulsatile lavage and then suctioned dry. Polymethylmethacrylate cement was prepared in the usual fashion using a vacuum mixer. Cement was applied to the cut surface of the proximal tibia as well as along the undersurface of a size 3 MBT tibial component. Tibial component was positioned and impacted into place. Excess cement was removed using Personal assistantreer elevators. Cement was then applied to the cut surfaces of the femur as well as along the posterior flanges of the size 3 femoral component the femoral component was positioned and impacted in place. Excess cement was removed using Personal assistantreer elevators. A 10 mm polyethylene trial was inserted and the knee was brought into full extension with steady axial compression applied. Finally, cement was applied to the backside of a 35 mm 3 peg oval dome patella and the patellar component was positioned and  patellar clamp applied. Excess cement was removed using Personal assistantreer elevators. After adequate curing of the cement, the tourniquet was deflated after a total tourniquet time of 93 minutes. Hemostasis was achieved using electrocautery. The knee was irrigated with copious amounts of normal saline with antibiotic solution using pulsatile lavage and then suctioned dry. 20 mL of 1.3% Exparel in 40 mL of normal saline was injected along the posterior capsule, medial and lateral gutters, and along the arthrotomy site. A 10 mm stabilized rotating platform polyethylene insert was inserted and the knee was placed through a range of motion with excellent mediolateral soft tissue balancing appreciated and excellent patellar tracking noted. 2 medium drains were placed in the wound bed and brought out through separate stab incisions to be attached to a reinfusion system. The medial parapatellar portion of the incision was reapproximated using interrupted sutures of #1 Vicryl. Subcutaneous tissue was then injected with a total of 30 cc of 0.25% Marcaine with epinephrine. Subcutaneous tissue was approximated in layers using first #0 Vicryl followed #2-0 Vicryl. The skin was approximated with skin staples. A sterile dressing was applied.  The patient tolerated the procedure well and was transported to the recovery room in stable condition.    Stefannie Defeo P. Angie FavaHooten, Jr., M.D.

## 2014-08-26 LAB — BASIC METABOLIC PANEL
ANION GAP: 6 (ref 5–15)
BUN: 10 mg/dL (ref 6–20)
CO2: 32 mmol/L (ref 22–32)
Calcium: 8.9 mg/dL (ref 8.9–10.3)
Chloride: 101 mmol/L (ref 101–111)
Creatinine, Ser: 0.65 mg/dL (ref 0.44–1.00)
GFR calc Af Amer: >60 mL/min (ref >60)
GLUCOSE: 122 mg/dL — AB (ref 65–99)
Potassium: 4.3 mmol/L (ref 3.5–5.1)
SODIUM: 139 mmol/L (ref 135–145)

## 2014-08-26 LAB — CBC
HCT: 35 % (ref 35.0–47.0)
Hemoglobin: 11.5 g/dL — ABNORMAL LOW (ref 12.0–16.0)
MCH: 28.1 pg (ref 26.0–34.0)
MCHC: 32.7 g/dL (ref 32.0–36.0)
MCV: 85.9 fL (ref 80.0–100.0)
Platelets: 224 10*3/uL (ref 150–440)
RBC: 4.08 MIL/uL (ref 3.80–5.20)
RDW: 13.5 % (ref 11.5–14.5)
WBC: 8.3 10*3/uL (ref 3.6–11.0)

## 2014-08-26 NOTE — Plan of Care (Signed)
Problem: Phase I Progression Outcomes Goal: Pain controlled with appropriate interventions Outcome: Progressing Pain controled with oral pain medication

## 2014-08-26 NOTE — Progress Notes (Addendum)
   Subjective: 1 Day Post-Op Procedure(s) (LRB): TOTAL KNEE ARTHROPLASTY (Right) Patient reports pain as 1 on 0-10 scale.   Patient is well, and has had no acute complaints or problems We will start therapy today.  Plan is to go Home after hospital stay. no nausea and no vomiting pt denies any cp or sob Objective: Vital signs in last 24 hours: Temp:  [98 F (36.7 C)-99.3 F (37.4 C)] 99.3 F (37.4 C) (05/03 0420) Pulse Rate:  [63-88] 77 (05/03 0420) Resp:  [15-20] 15 (05/03 0420) BP: (124-174)/(56-85) 124/62 mmHg (05/03 0420) SpO2:  [92 %-100 %] 92 % (05/03 0420) Weight:  [92.262 kg (203 lb 6.4 oz)] 92.262 kg (203 lb 6.4 oz) (05/02 2201) Patient denies any chest pains or shortness of breath. Patient slept very well on the night and had no crepitations.  Intake/Output from previous day: 05/02 0701 - 05/03 0700 In: 6846 [P.O.:240; I.V.:6206; IV Piggyback:400] Out: 3150 [Urine:3050; Blood:100] Intake/Output this shift:     Recent Labs  08/26/14 0433  HGB 11.5*    Recent Labs  08/26/14 0433  WBC 8.3  RBC 4.08  HCT 35.0  PLT 224    Recent Labs  08/26/14 0433  NA 139  K 4.3  CL 101  CO2 32  BUN 10  CREATININE 0.65  GLUCOSE 122*  CALCIUM 8.9   No results for input(s): LABPT, INR in the last 72 hours.  EXAM General - Patient is Alert, Appropriate and Oriented Extremity - Neurologically intact Neurovascular intact Sensation intact distally Intact pulses distally Dorsiflexion/Plantar flexion intact Dressing - dressing C/D/I Motor Function - intact, moving foot and toes well on exam. Able to do straight leg raises on her own.  Past Medical History  Diagnosis Date  . Hypertension   . Heart murmur   . GERD (gastroesophageal reflux disease)   . Arthritis     Assessment/Plan: 1 Day Post-Op Procedure(s) (LRB): TOTAL KNEE ARTHROPLASTY (Right) Active Problems:   OA (osteoarthritis) of knee   Total knee replacement status  Estimated body mass index  is 37.19 kg/(m^2) as calculated from the following:   Height as of this encounter: _0  (1.575 m).   Weight as of this encounter: 92.262 kg (203 lb 6.4 oz). Advance diet Up with therapy CBC and met B as well as pro time and INR tomorrow morning DVT Prophylaxis - Lovenox, Foot Pumps and TED hose Weight-Bearing as tolerated to right leg D/C O2 and Pulse OX and try on Room [employer] R. [place] 08/26/2014, 7:03 AM

## 2014-08-26 NOTE — Care Management Note (Signed)
[facility] has accepted this patient for home health. Lovenox $111.77

## 2014-08-26 NOTE — Evaluation (Signed)
Physical Therapy Evaluation Patient Details Name: Nancy York MRN: [id number] DOB: [birth year] Today's Date: 08/26/2014   History of Present Illness  Pt is a 69 y.o. female s/p R TKA 08/25/14 d/t degenerative arthrosis of R knee.  Clinical Impression  Currently pt demonstrates impairments with R LE strength, R knee ROM, and limitations with functional mobility.  Prior to admission, pt was independent without AD.  Pt lives with her husband on main level of 2 story home with 2 steps to enter (with B iron posts).  Currently pt is min assist with supine to sit, sit to/from stand with RW, and ambulation 25 feet with RW.  Pt would benefit from skilled PT to address above noted impairments and functional limitations.  Recommend pt discharge to home with HHPT when medically appropriate.    Follow Up Recommendations Home health PT    Equipment Recommendations  None recommended by PT    Recommendations for Other Services       Precautions / Restrictions Precautions Precautions: Fall Restrictions Weight Bearing Restrictions: Yes RLE Weight Bearing: Weight bearing as tolerated      Mobility  Bed Mobility Overal bed mobility: Needs Assistance Bed Mobility: Supine to Sit     Supine to sit: Min assist;HOB elevated     General bed mobility comments: assist for R LE d/t pain  Transfers Overall transfer level: Needs assistance Equipment used: Rolling walker (2 wheeled) Transfers: Sit to/from [employer] Sit to Stand: Min assist Stand pivot transfers: Min assist       General transfer comment: vc's required for hand and feet placement with sit to/from stand  Ambulation/Gait Ambulation/Gait assistance: Min assist Ambulation Distance (Feet): 25 Feet Assistive device: Rolling walker (2 wheeled) Gait Pattern/deviations: Step-to pattern;Decreased weight shift to right     General Gait Details: mild decreased stance time R LE; decreased R heelstrike  Stairs            Wheelchair Mobility    Modified Rankin (Stroke Patients Only)       Balance                                             Pertinent Vitals/Pain Pain Assessment: 0-10 Pain Score:  (1/10 at rest (beginning and end of session); 8/10 with R knee flexion ROM) Pain Location: R knee Pain Intervention(s): Limited activity within patient's tolerance;Monitored during session;Premedicated before session;Repositioned;Ice applied    Home Living Family/patient expects to be discharged to:: Private residence Living Arrangements: Spouse/significant other Available Help at Discharge: Family;Available 24 hours/day Type of Home: House Home Access: Stairs to enter   [employer] of Steps: 2 with B iron posts to hold onto Home Layout: Two level;Able to live on main level with bedroom/bathroom Home Equipment: Dan Humphreys - 2 wheels;Bedside commode      Prior Function Level of Independence: Independent               Hand Dominance        Extremity/Trunk Assessment   Upper Extremity Assessment: Defer to OT evaluation           Lower Extremity Assessment: RLE deficits/detail RLE Deficits / Details: R hip flexion >3+/5; R knee extension at least 3-/5; R knee flexion at least 3-/5; R DF >4/5; R knee extension 18 degrees short of neutral semi supine; R knee flexion 62 degrees in sitting  Communication   Communication: No difficulties  Cognition Arousal/Alertness: Awake/alert Behavior During Therapy: WFL for tasks assessed/performed Overall Cognitive Status: Within Functional Limits for tasks assessed                      General Comments      Exercises Total Joint Exercises Goniometric ROM: R knee extension 18 degrees short of neutral (semi-supine); R knee flexion 62 degrees (sitting)  Treatment: Performed semi-supine B LE therapeutic exercise x 10 reps:  Ankle pumps (AROM B LE's); quad sets x3 second holds (AROM B LE's); SAQ's  (AAROM R; AROM L); heelslides (AAROM R; AROM L), hip abd/adduction (AROM R; AROM L), and SLR (AROM R; AROM L).  Pt required vc's and tactile cues for correct technique with exercises.      Assessment/Plan    PT Assessment Patient needs continued PT services  PT Diagnosis Difficulty walking;Abnormality of gait   PT Problem List Decreased strength;Decreased range of motion;Decreased mobility  PT Treatment Interventions DME instruction;Gait training;Stair training;Functional mobility training;Therapeutic activities;Therapeutic exercise;Patient/family education;Balance training   PT Goals (Current goals can be found in the Care Plan section) Acute Rehab PT Goals Patient Stated Goal: To go home PT Goal Formulation: With patient Time For Goal Achievement: 09/09/14 Potential to Achieve Goals: Good    Frequency BID   Barriers to discharge        Co-evaluation               End of Session Equipment Utilized During Treatment: Gait belt Activity Tolerance: Patient tolerated treatment well (increased pain briefly with R knee flexion ROM) Patient left: in chair;with call bell/phone within reach;with chair alarm set;with SCD's reapplied;with family/visitor present (B AV1's and polar care in place; B heels elevated via towel rolls) Nurse Communication: Mobility status         Time: [id number] PT Time Calculation (min) (ACUTE ONLY): 44 min   Charges:   PT Evaluation $Initial PT Evaluation Tier I: 1 Procedure PT Treatments $Therapeutic Exercise: 8-22 mins   PT G CodesHendricks Limes:        Lemon Sternberg 08/26/2014, 10:40 AM Hendricks LimesEmily Dejanique Ruehl, PT [phone]

## 2014-08-26 NOTE — Progress Notes (Signed)
Pt remaining alert and oriented. minimal pain this shift control with oral and scheduled pain medication. NO drainage in Autovac or Hemovac this shift. Tolerating po fluids. neuro checks wdl. Dressing remaining dry and intact. Foley patent and draining urine. Iv infusing without difficulty.   Sheron NightingaleMary Nekhi Liwanag RN

## 2014-08-26 NOTE — Care Management Note (Signed)
Case Management Note  Patient Details  Name: Nancy BlakeSandra S Rohrig MRN: [id number] Date of Birth: Oct 03, 1945  Subjective/Objective:                  Right total knee arthroplasty 08/25/14. Patient attempting to reach [facility] on [facility]. Patient was attempting to have "a ham biscuit" brought to her this morning for breakfast. Patient would like to return home with her husband at discharge. She has a front-wheeled walker available at home and has been asked to have husband bring that to hospital for PT to evaluate. PT is currently recommending HHPT.  Action/Plan: Home health referral sent to Lonn GeorgiaBarb Taylor RN with [facility] per patient request. [facility]  [facility] [phone]. Expected Discharge Date:                  Expected Discharge Plan:  Home w Home Health Services  In-House Referral:     Discharge planning Services  CM Consult  Post Acute Care Choice:    Choice offered to:  Patient  DME Arranged:   n/a DME Agency:     HH Arranged:  PT HH Agency:  [facility]  Status of Service:  In process, will continue to follow  Medicare Important Message Given:  Yes Date Medicare IM Given:  08/26/14 Medicare IM give by:  Collie SiadAngela Beautiful Pensyl Date Additional Medicare IM Given:    Additional Medicare Important Message give by:     If discussed at Long Length of Stay Meetings, dates discussed:    Additional Comments:  Collie Siadngela Serenna Deroy, RN 08/26/2014, 10:16 AM

## 2014-08-26 NOTE — Progress Notes (Signed)
Physical Therapy Treatment Patient Details Name: Nancy York MRN: [id number] DOB: 02/16/1946 Today's Date: 08/26/2014    History of Present Illness Pt is a 69 y.o. female s/p R TKA 08/25/14 d/t degenerative arthrosis of R knee.    PT Comments    Pt able to progress ambulation distance to 70 feet with RW CGA this afternoon with 3/10 R knee pain.  Pt progressing well with physical therapy.  Anticipate with continued progress, pt will be able to discharge home with RW, HHPT, and support of her family.  Follow Up Recommendations  Home health PT     Equipment Recommendations  None recommended by PT    Recommendations for Other Services       Precautions / Restrictions Precautions Precautions: Fall Restrictions Weight Bearing Restrictions: Yes RLE Weight Bearing: Weight bearing as tolerated    Mobility  Bed Mobility Overal bed mobility: Needs Assistance Bed Mobility: Sit to Supine     Sit to supine: Supervision    Transfers Overall transfer level: Needs assistance Equipment used: Rolling walker (2 wheeled) Transfers: Sit to/from [employer] Sit to Stand: Min assist Stand pivot transfers: Min guard       General transfer comment: vc's required for hand and feet placement with sit to/from stand  Ambulation/Gait Ambulation/Gait assistance: Min guard Ambulation Distance (Feet): 70 Feet Assistive device: Rolling walker (2 wheeled) Gait Pattern/deviations: Step-to pattern;Decreased weight shift to right     General Gait Details: mild decreased stance time R LE; decreased R heelstrike   Stairs            Wheelchair Mobility    Modified Rankin (Stroke Patients Only)       Balance                                    Cognition Arousal/Alertness: Awake/alert Behavior During Therapy: WFL for tasks assessed/performed Overall Cognitive Status: Within Functional Limits for tasks assessed                       Exercises Performed sitting exercises x 10 reps B LE's:  Heel/toe raises (AROM R; AROM L); LAQ's (AAROM R; AROM L); marching/hip flexion (AROM R; AROM L); and R knee self flexion stretches/ROM in sitting (3x15 seconds).  Pt required vc's and tactile cues for correct technique with exercises.     General Comments        Pertinent Vitals/Pain Pain Assessment: 0-10 Pain Score: 3  Pain Location: R knee Pain Descriptors / Indicators: Sore Pain Intervention(s): Limited activity within patient's tolerance;Monitored during session;Repositioned;Ice applied           Vitals pre-activity: HR 83 bpm; O2 93% on room air. Vitals post-activity: HR 93 bpm; O2 94% on room air   Home Living Family/patient expects to be discharged to:: Private residence Living Arrangements: Spouse/significant other Available Help at Discharge: Family;Available 24 hours/day Type of Home: House Home Access: Stairs to enter   Home Layout: Two level;Able to live on main level with bedroom/bathroom Home Equipment: Dan Humphreys - 2 wheels;Bedside commode      Prior Function Level of Independence: Independent          PT Goals (current goals can now be found in the care plan section) Acute Rehab PT Goals Patient Stated Goal: To go home PT Goal Formulation: With patient Time For Goal Achievement: 09/09/14 Potential to Achieve Goals: Good Additional Goals Additional  Goal #1: Pt's R knee ROM 0 degrees to >90 degrees. Progress towards PT goals: Progressing toward goals    Frequency  BID    PT Plan Current plan remains appropriate    Co-evaluation             End of Session Equipment Utilized During Treatment: Gait belt Activity Tolerance: Patient tolerated treatment well Patient left: with call bell/phone within reach;with SCD's reapplied;in bed;with bed alarm set (B AV1's and polar care in place; B heels elevated via towel rolls.)     Time: 1321-1350 PT Time Calculation (min) (ACUTE ONLY): 29  min  Charges:  $Gait Training: 8-22 mins $Therapeutic Exercise: 8-22 mins                    G CodesHendricks Limes:      Nancy York 08/26/2014, 2:03 PM Hendricks LimesEmily Lavonia Eager, PT [phone]

## 2014-08-26 NOTE — Progress Notes (Signed)
Clinical Social Worker (CSW) received SNF consult. PT is recommending home health. RN Case Manager aware of above. Please reconsult if future social work needs arise. CSW signing off.   Shalay Carder Morgan, LCSWA [phone] 

## 2014-08-27 LAB — CBC WITH DIFFERENTIAL/PLATELET
Basophils Absolute: 0 10*3/uL (ref 0–0.1)
Basophils Relative: 0 %
Eosinophils Absolute: 0.1 10*3/uL (ref 0–0.7)
HEMATOCRIT: 36.2 % (ref 35.0–47.0)
Hemoglobin: 12 g/dL (ref 12.0–16.0)
LYMPHS ABS: 1.3 10*3/uL (ref 1.0–3.6)
MCH: 28.4 pg (ref 26.0–34.0)
MCHC: 33.3 g/dL (ref 32.0–36.0)
MCV: 85.3 fL (ref 80.0–100.0)
MONO ABS: 0.6 10*3/uL (ref 0.2–0.9)
Monocytes Relative: 6 %
Neutro Abs: 7.7 10*3/uL — ABNORMAL HIGH (ref 1.4–6.5)
Neutrophils Relative %: 79 %
PLATELETS: 239 10*3/uL (ref 150–440)
RBC: 4.24 MIL/uL (ref 3.80–5.20)
RDW: 13.7 % (ref 11.5–14.5)
WBC: 9.7 10*3/uL (ref 3.6–11.0)

## 2014-08-27 LAB — BASIC METABOLIC PANEL
Anion gap: 9 (ref 5–15)
BUN: 8 mg/dL (ref 6–20)
CALCIUM: 8.7 mg/dL — AB (ref 8.9–10.3)
CO2: 31 mmol/L (ref 22–32)
CREATININE: 0.64 mg/dL (ref 0.44–1.00)
Chloride: 97 mmol/L — ABNORMAL LOW (ref 101–111)
GFR calc Af Amer: >60 mL/min (ref >60)
GLUCOSE: 161 mg/dL — AB (ref 65–99)
Potassium: 3.6 mmol/L (ref 3.5–5.1)
Sodium: 137 mmol/L (ref 135–145)

## 2014-08-27 LAB — PROTIME-INR
INR: 1.1
Prothrombin Time: 14.4 seconds (ref 11.4–15.0)

## 2014-08-27 NOTE — Progress Notes (Signed)
   Subjective: 2 Days Post-Op Procedure(s) (LRB): TOTAL KNEE ARTHROPLASTY (Right) Patient reports pain as 3 on 0-10 scale.   Patient is well, and has had no acute complaints or problems We will start therapy today.  Plan is to go Home after hospital stay. no nausea and no vomiting Patient denies any chest pains or shortness of breath. Objective: Vital signs in last 24 hours: Temp:  [98.5 F (36.9 C)-100.7 F (38.2 C)] 100.2 F (37.9 C) (05/04 0421) Pulse Rate:  [83-101] 101 (05/04 0421) Resp:  [17-18] 17 (05/04 0421) BP: (148-173)/(52-101) 173/61 mmHg (05/04 0421) SpO2:  [91 %-99 %] 92 % (05/04 0421) well approximated incision  Intake/Output from previous day: 05/03 0701 - 05/04 0700 In: 1753.3 [P.O.:240; I.V.:1213.3; IV Piggyback:300] Out: 4925 [Urine:4925] Intake/Output this shift:     Recent Labs  08/26/14 0433 08/27/14 0425  HGB 11.5* 12.0    Recent Labs  08/26/14 0433 08/27/14 0425  WBC 8.3 9.7  RBC 4.08 4.24  HCT 35.0 36.2  PLT 224 239    Recent Labs  08/26/14 0433 08/27/14 0425  NA 139 137  K 4.3 3.6  CL 101 97*  CO2 32 31  BUN 10 8  CREATININE 0.65 0.64  GLUCOSE 122* 161*  CALCIUM 8.9 8.7*    Recent Labs  08/27/14 0425  INR 1.10    EXAM General - Patient is Alert, Appropriate and Oriented Extremity - Neurologically intact Neurovascular intact Sensation intact distally Intact pulses distally Dorsiflexion/Plantar flexion intact Incision: dressing C/D/I Dressing - dressing C/D/I Motor Function - intact, moving foot and toes well on exam. Patient moving leg well no complaints. Heels are elevated off the bed; nontender to touch.  Past Medical History  Diagnosis Date  . Hypertension   . Heart murmur   . GERD (gastroesophageal reflux disease)   . Arthritis     Assessment/Plan: 2 Days Post-Op Procedure(s) (LRB): TOTAL KNEE ARTHROPLASTY (Right) Active Problems:   OA (osteoarthritis) of knee   Total knee replacement  status  Estimated body mass index is 37.19 kg/(m^2) as calculated from the following:   Height as of this encounter: 5\' 2"  (1.575 m).   Weight as of this encounter: 92.262 kg (203 lb 6.4 oz). Advance diet Up with therapy D/C IV fluids Plan for discharge tomorrow Discharge home with home health  Labs: None for tomorrow DVT Prophylaxis - Lovenox, Foot Pumps and TED hose Weight-Bearing as tolerated to right leg D/C O2 and Pulse OX and try on Room Air  Jon R. [facility] PA [facility] 08/27/2014, 7:22 AM

## 2014-08-27 NOTE — Plan of Care (Signed)
Problem: Consults Goal: Diagnosis- Total Joint Replacement Outcome: Progressing Hemiarthroplasty     

## 2014-08-27 NOTE — Progress Notes (Signed)
Physical Therapy Treatment Patient Details Name: Nancy BlakeSandra S York MRN: [id number] DOB: 03-15-1946 Today's Date: York    History of Present Illness Pt is a 69 y.o. female s/p R TKA 08/25/14 d/t degenerative arthrosis of R knee.    PT Comments    Pt continues to progress well with physical therapy and able to ambulate 200 feet with RW CGA with 2/10 R knee pain.  Anticipate with continued progress, pt will be able to discharge home with HHPT and support of family when medically appropriate.  Plan to assess stairs this afternoon.  Follow Up Recommendations  Home health PT     Equipment Recommendations  None recommended by PT    Recommendations for Other Services       Precautions / Restrictions Precautions Precautions: Fall Restrictions Weight Bearing Restrictions: No RLE Weight Bearing: Weight bearing as tolerated    Mobility  Bed Mobility Overal bed mobility: Needs Assistance Bed Mobility: Supine to Sit     Supine to sit: Supervision        Transfers Overall transfer level: Needs assistance Equipment used: Rolling walker (2 wheeled) Transfers: Sit to/from [employer];Stand Pivot Transfers Sit to Stand: Min guard Stand pivot transfers: Min guard       General transfer comment: vc's required for hand and feet placement with sit to/from stand  Ambulation/Gait Ambulation/Gait assistance: Min guard Ambulation Distance (Feet): 200 Feet Assistive device: Rolling walker (2 wheeled)       General Gait Details: mild decreased stance time R LE; decreased R heelstrike   Stairs            Wheelchair Mobility    Modified Rankin (Stroke Patients Only)       Balance                                    Cognition Arousal/Alertness: Awake/alert Behavior During Therapy: WFL for tasks assessed/performed Overall Cognitive Status: Within Functional Limits for tasks assessed                      Exercises Total Joint Exercises Goniometric  ROM: R knee extension 10 degrees short of neutral (semi-supine); R knee flexion 75 degrees (sitting) Performed semi-supine B LE therapeutic exercise x 15 reps:  Ankle pumps (AROM B LE's); quad sets x3 second holds (AROM B LE's); SAQ's (AROM R; AROM L); heelslides (AAROM R; AROM L), hip abd/adduction (AROM R; AROM L), and SLR (AROM R; AROM L).  Pt required occasional vc's and tactile cues for correct technique with exercises.    General Comments General comments (skin integrity, edema, etc.): Increased blood noted to be coming through pt's dressing proximal to R knee; nursing notified and came to assess pt.      Pertinent Vitals/Pain Pain Assessment: 0-10 Pain Score: 2  Pain Location: R knee Pain Descriptors / Indicators: Sore Pain Intervention(s): Premedicated before session;Limited activity within patient's tolerance;Monitored during session    Home Living                      Prior Function            PT Goals (current goals can now be found in the care plan section) Acute Rehab PT Goals Patient Stated Goal: To go home PT Goal Formulation: With patient Time For Goal Achievement: 09/09/14 Potential to Achieve Goals: Good Progress towards PT goals: Progressing toward goals  Frequency  BID    PT Plan Current plan remains appropriate    Co-evaluation             End of Session Equipment Utilized During Treatment: Gait belt Activity Tolerance: Patient tolerated treatment well Patient left: in chair;Other (comment) (nursing present changing pt's dressing)     Time: [id number] PT Time Calculation (min) (ACUTE ONLY): 32 min  Charges:  $Gait Training: 8-22 mins $Therapeutic Exercise: 8-22 mins                    G CodesHendricks Limes:      Nancy York, 10:27 AM Hendricks LimesEmily Nella Botsford, PT [phone]

## 2014-08-27 NOTE — Progress Notes (Signed)
   08/27/14 1537  PT Visit Information  Last PT Received On 08/27/14  Assistance Needed +1  History of Present Illness Pt is a 69 y.o. female s/p R TKA 08/25/14 d/t degenerative arthrosis of R knee.  PT Time Calculation  PT Start Time (ACUTE ONLY) 1338  PT Stop Time (ACUTE ONLY) 1404  PT Time Calculation (min) (ACUTE ONLY) 26 min  Subjective Data  Subjective Pt reporting just taking a nap in bed.  Patient Stated Goal To go home  Precautions  Precautions Fall  Restrictions  Weight Bearing Restrictions Yes  RLE Weight Bearing WBAT  Pain Assessment  Pain Assessment 0-10  Pain Score 1  Pain Location R knee  Pain Descriptors / Indicators Sore  Pain Intervention(s) Limited activity within patient's tolerance;Monitored during session;Repositioned;Ice applied  Cognition  Arousal/Alertness Awake/alert  Behavior During Therapy WFL for tasks assessed/performed  Overall Cognitive Status Within Functional Limits for tasks assessed  Bed Mobility  Overal bed mobility Needs Assistance  Bed Mobility Supine to Sit  Supine to sit Supervision  Sit to supine Supervision  Transfers  Overall transfer level Needs assistance  Equipment used Rolling walker (2 wheeled)  Sit to Stand Supervision  Stand pivot transfers Supervision  Ambulation/Gait  Ambulation/Gait assistance Supervision  Ambulation Distance (Feet) 240 Feet  Assistive device Rolling walker (2 wheeled)  General Gait Details mild decreased stance time R LE; decreased R heelstrike  Stairs Yes  Stairs assistance Min guard  Stair Management Two rails  Number of Stairs 4  General stair comments initial vc's required for stepping technique  PT - End of Session  Equipment Utilized During Treatment Gait belt  Activity Tolerance Patient tolerated treatment well  Patient left Other (comment);in bed;with call bell/phone within reach;with bed alarm set;with SCD's reapplied (Polar care in place; B heels elevated via towel rolls)  PT -  Assessment/Plan  PT Plan Current plan remains appropriate  PT Frequency (ACUTE ONLY) BID  Follow Up Recommendations Home health PT  PT equipment None recommended by PT  PT Goal Progression  Progress towards PT goals Progressing toward goals  Acute Rehab PT Goals  PT Goal Formulation With patient  Time For Goal Achievement 09/09/14  Potential to Achieve Goals Good  PT General Charges  $$ ACUTE PT VISIT 1 Procedure  PT Treatments  $Gait Training 8-22 mins  $Therapeutic Exercise 8-22 mins  Therapeutic Exercise:  Performed sitting exercises x 15 reps B LE's:  Heel/toe raises (AROM R; AROM L); LAQ's (AROM R; AROM L); marching/hip flexion (AROM R; AROM L); and R knee flexion sitting self ROM exercise 3x15 seconds.  Pt required vc's and tactile cues for correct technique with exercises.  Clinical Impression:  Pt continues to progress well with physical therapy.  Plan for discharge home with HHPT and support of family.   Hendricks LimesEmily Michale Emmerich, PT [phone]

## 2014-08-27 NOTE — Progress Notes (Signed)
Occupational Therapy Treatment Patient Details Name: Nancy BlakeSandra S York MRN: [id number] DOB: [birth year] Today's Date: 08/27/2014    History of present illness Pt is a 69 y.o. female s/p R TKA 08/25/14 d/t degenerative arthrosis of R knee.   OT comments  Pt seen in room while sitting up in chair with husband in room with her.  Reviewed use of assistive devices for ADLs and pt is doing better with flexion of knee and less pain today and may not need any AD when going home.  Will re-assess tomorrow to ensure she can safely complete ADLs with no LOB.  Pt and husband aware of other recommendations like shower chair to prevent falls and increase safety at home.  Follow Up Recommendations       Equipment Recommendations       Recommendations for Other Services      Precautions / Restrictions Precautions Precautions: Fall Restrictions Weight Bearing Restrictions: No RLE Weight Bearing: Weight bearing as tolerated       Mobility Bed Mobility Overal bed mobility: Needs Assistance Bed Mobility: Supine to Sit     Supine to sit: Supervision        Transfers Overall transfer level: Needs assistance Equipment used: Rolling walker (2 wheeled) Transfers: Sit to/from [employer];Stand Pivot Transfers Sit to Stand: Min guard Stand pivot transfers: Min guard       General transfer comment: vc's required for hand and feet placement with sit to/from stand    Balance                                   ADL Overall ADL's : Needs assistance/impaired                     Lower Body Dressing: Minimal assistance;With adaptive equipment                 General ADL Comments:  (Pt seen in room with her husband to review AD for LB dressing and completed with min assist/cues)      Vision                     Perception     Praxis      Cognition   Behavior During Therapy: [facility] HospitalWFL for tasks assessed/performed Overall Cognitive Status: Within Functional Limits for  tasks assessed                       Extremity/Trunk Assessment               Exercises    Shoulder Instructions       General Comments      Pertinent Vitals/ Pain       Pain Assessment: 0-10 Pain Score: 2  Pain Location: R knee Pain Descriptors / Indicators: Aching;Sore Pain Intervention(s): Monitored during session;Premedicated before session;Ice applied  Home Living                                          Prior Functioning/Environment              Frequency       Progress Toward Goals  OT Goals(current goals can now be found in the care plan section)  Progress towards OT goals: Progressing toward goals  Acute Rehab OT Goals  Patient Stated Goal: To go home  Plan Discharge plan remains appropriate    Co-evaluation                 End of Session     Activity Tolerance     Patient Left     Nurse Communication          Time:  -     Charges: OT General Charges $OT Visit: 1 Procedure OT Treatments $Self Care/Home Management : 8-22 mins   Susanne BordersSusan York, OTR/L  York,Nancy 08/27/2014, 1:12 PM

## 2014-08-27 NOTE — Progress Notes (Signed)
Pt is alert and oriented, sleeping between care. VSS. Complaining of minimal pain improved with PO pain medication. Foley and hemovac to be removed in am. Resting quietly at this time. Will continue to monitor.

## 2014-08-28 LAB — CBC
HCT: 35.2 % (ref 35.0–47.0)
HEMOGLOBIN: 11.6 g/dL — AB (ref 12.0–16.0)
MCH: 28.2 pg (ref 26.0–34.0)
MCHC: 33 g/dL (ref 32.0–36.0)
MCV: 85.5 fL (ref 80.0–100.0)
Platelets: 251 10*3/uL (ref 150–440)
RBC: 4.12 MIL/uL (ref 3.80–5.20)
RDW: 13.9 % (ref 11.5–14.5)
WBC: 10 10*3/uL (ref 3.6–11.0)

## 2014-08-28 MED ORDER — LACTULOSE 10 GM/15ML PO SOLN: 20.0000 g | Freq: Two times a day (BID) | ORAL | Status: DC | PRN

## 2014-08-28 MED ORDER — ENOXAPARIN SODIUM 30 MG/0.3ML ~~LOC~~ SOLN: 40.0000 mg | SUBCUTANEOUS | Status: DC

## 2014-08-28 MED ORDER — FERROUS SULFATE 325 (65 FE) MG PO TABS: 325.0000 mg | ORAL_TABLET | Freq: Two times a day (BID) | ORAL | Status: DC

## 2014-08-28 MED ORDER — OXYCODONE HCL 5 MG PO TABS: 5.0000 mg | ORAL_TABLET | ORAL | Status: DC | PRN

## 2014-08-28 MED ORDER — TRAMADOL HCL 50 MG PO TABS: 50.0000 mg | ORAL_TABLET | ORAL | Status: DC | PRN

## 2014-08-28 NOTE — Discharge Summary (Signed)
Physician Discharge Summary  Patient ID: Nancy York MRN: [id number] DOB/AGE: 12/08/1945 69 y.o.  Admit date: 08/25/2014 Discharge date: 08/28/2014  Admission Diagnoses:  oa Total knee arthroplasty   Discharge Diagnoses: Patient Active Problem List   Diagnosis Date Noted  . 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 OA (osteoarthritis) of knee 08/24/2014    Past Medical History  Diagnosis Date  . Hypertension   . Heart murmur   . GERD (gastroesophageal reflux disease)   . Arthritis      Transfusion: Autovac transfusions given the first 6 hours postoperatively   Consultants (if any):    Discharged Condition: Improved  Hospital Course: Nancy York is an 69 y.o. female who was admitted 08/25/2014 with a diagnosis of degenerative arthrosis right knee and went to the operating room on 08/25/2014 and underwent the above named procedures.    Surgeries: Procedure(s): TOTAL KNEE ARTHROPLASTY on 08/25/2014 - 08/26/2014 Anesthesia: Spinal Soft tissue release: Anterior cruciate ligament, posterior cruciate ligament, deep medial collateral ligaments as well as the patellofemoral ligament. Patient tolerated the surgery well. Taken to PACU where she was stabilized and then transferred to the orthopedic floor.  Started on Lovenox 30q 12 hrs. Foot pumps applied bilaterally at 80 mm. Heels elevated on bed with rolled towels. No evidence of DVT. Negative Homan. Physical therapy started on day #1 for gait training and transfer. Upon being discharged patient was ambulating greater than 200 feet. Was able to go up 4 steps. Was independent with bed to chair transfers. OT started day #1 for ADL and assisted devices.  Patient's IV , foley and hemovac was d/c on day #2.  Implants:  Depuy size 3 femur, size 3 tibia, 35 3 pegged oval dome patella with a 10 mm spacer  She was given perioperative antibiotics:  Anti-infectives    Start     Dose/Rate Route Frequency Ordered Stop   08/25/14 2200   clindamycin (CLEOCIN) IVPB 900 mg     900 mg 100 mL/hr over 30 Minutes Intravenous 3 times per day 08/25/14 1910 08/26/14 1829   08/25/14 1915  clindamycin (CLEOCIN) IVPB 600 mg  Status:  Discontinued     600 mg 100 mL/hr over 30 Minutes Intravenous Every 6 hours 08/25/14 1909 08/25/14 2012   08/25/14 1141  clindamycin (CLEOCIN) 900 MG/50ML IVPB    Comments:  STIPETICH, JACQUELIN: cabinet override      08/25/14 1141 08/25/14 1231   08/25/14 1115  clindamycin (CLEOCIN) IVPB 900 mg  Status:  Discontinued     900 mg 100 mL/hr over 30 Minutes Intravenous On call to O.R. 08/25/14 1105 08/25/14 1635   08/25/14 0600  clindamycin (CLEOCIN) IVPB 900 mg  Status:  Discontinued     900 mg 100 mL/hr over 30 Minutes Intravenous On call to O.R. 08/24/14 1609 08/24/14 1709    .  She was given sequential compression devices, early ambulation, and Lovenox for DVT prophylaxis.  She benefited maximally from the hospital stay and there were no complications.    Recent vital signs:  Filed Vitals:   08/28/14 0704  BP: 153/66  Pulse: 90  Temp: 98.8 F (37.1 C)  Resp: 18    Recent laboratory studies:  Lab Results  Component Value Date   HGB 11.6* 08/28/2014   HGB 12.0 08/27/2014   HGB 11.5* 08/26/2014   Lab Results  Component Value Date   WBC 10.0 08/28/2014   PLT 251 08/28/2014   Lab Results  Component Value Date   INR  1.10 08/27/2014   Lab Results  Component Value Date   NA 137 08/27/2014   K 3.6 08/27/2014   CL 97* 08/27/2014   CO2 31 08/27/2014   BUN 8 08/27/2014   CREATININE 0.64 08/27/2014   GLUCOSE 161* 08/27/2014    Discharge Medications:     Medication List    TAKE these medications        aspirin EC 81 MG tablet  Take 81 mg by mouth daily.     calcium citrate-vitamin D 315-200 MG-UNIT per tablet  Commonly known as:  CITRACAL+D  Take 1 tablet by mouth 3 (three) times daily.     cetirizine 10 MG tablet  Commonly known as:  ZYRTEC  Take 10 mg by mouth daily.      CHOLESTOFF PO  Take 2 capsules by mouth 2 (two) times daily.     enoxaparin 30 MG/0.3ML injection  Commonly known as:  LOVENOX  Inject 0.4 mLs (40 mg total) into the skin daily.     ferrous sulfate 325 (65 FE) MG tablet  Take 1 tablet (325 mg total) by mouth 2 (two) times daily with a meal.     FIBER CHOICE FRUITY BITES PO  Take 1 Dose by mouth once.     Flax Seed Oil 1000 MG Caps  Take 1 capsule by mouth 3 (three) times daily.     Ginkgo Biloba 120 MG Caps  Take 120 mg by mouth 2 (two) times daily.     hydrochlorothiazide 25 MG tablet  Commonly known as:  HYDRODIURIL  Take 25 mg by mouth daily.     loratadine 10 MG tablet  Commonly known as:  CLARITIN  Take 10 mg by mouth daily.     meclizine 25 MG tablet  Commonly known as:  ANTIVERT  Take 25 mg by mouth 3 (three) times daily as needed for dizziness.     metoprolol succinate 25 MG 24 hr tablet  Commonly known as:  TOPROL-XL  Take 25 mg by mouth daily.     OSTEO BI-FLEX TRIPLE STRENGTH PO  Take 1,200 mg by mouth 2 (two) times daily.     oxyCODONE 5 MG immediate release tablet  Commonly known as:  Oxy IR/ROXICODONE  Take 1-2 tablets (5-10 mg total) by mouth every 4 (four) hours as needed for breakthrough pain.     pantoprazole 40 MG tablet  Commonly known as:  PROTONIX  Take 40 mg by mouth daily.     traMADol 50 MG tablet  Commonly known as:  ULTRAM  Take 1-2 tablets (50-100 mg total) by mouth every 4 (four) hours as needed for moderate pain.     vitamin B-12 500 MCG tablet  Commonly known as:  CYANOCOBALAMIN  Take 500 mcg by mouth 2 (two) times daily.        Diagnostic Studies: Dg Knee 1-2 Views Right  08/25/2014   CLINICAL DATA:  Knee osteoarthritis. Postop from right knee arthroplasty.  EXAM: RIGHT KNEE - 1-2 VIEW  COMPARISON:  None.  FINDINGS: Right knee arthroplasty is seen with all 3 components in expected position. No evidence of fracture or dislocation. Surgical drains and skin staples are seen in  place.  IMPRESSION: Expected postoperative appearance of total right knee arthroplasty. No evidence of fracture or other complication.   Electronically Signed   By: Myles RosenthalJohn  Stahl M.D.   On: 08/25/2014 17:06    Disposition:       Discharge Instructions    Diet - low sodium  heart healthy    Complete by:  As directed      Increase activity slowly    Complete by:  As directed            Follow-up Information    Call to follow up.   Why:  Patient should already have a scheduled appointment. Please call to verify time       Signed: WOLFE,JON R. 08/28/2014, 7:46 AM

## 2014-08-28 NOTE — Progress Notes (Addendum)
Physical Therapy Treatment Patient Details Name: Charlett BlakeSandra S Weissinger MRN: [id number] DOB: 02/15/1946 Today's Date: 08/28/2014    History of Present Illness Pt is a 69 y.o. female s/p R TKA 08/25/14 d/t degenerative arthrosis of R knee.    PT Comments    Pt con't to make progression towards goals, increased ambulation distance to approx 300' with RW and CGA. Cues to increase heelstrike with good carryover. Instructed in con't follow through of HEP specifically for AAROM knee flexion. Will con't to benefit from skilled PT to increase ROM, strength and balance for improved functional mobility.   Follow Up Recommendations  Home health PT     Equipment Recommendations  None recommended by PT    Recommendations for Other Services       Precautions / Restrictions Precautions Precautions: Fall Restrictions Weight Bearing Restrictions: Yes RLE Weight Bearing: Weight bearing as tolerated    Mobility  Bed Mobility                  Transfers Overall transfer level: Needs assistance Equipment used: Rolling walker (2 wheeled) Transfers: Sit to/from Stand Sit to Stand: Supervision            Ambulation/Gait Ambulation/Gait assistance: Supervision Ambulation Distance (Feet): 300 Feet Assistive device: Rolling walker (2 wheeled)       General Gait Details: mild decreased stance time R LE; decreased R heelstrike   Stairs            Wheelchair Mobility    Modified Rankin (Stroke Patients Only)       Balance                                    Cognition Arousal/Alertness: Awake/alert Behavior During Therapy: WFL for tasks assessed/performed Overall Cognitive Status: Within Functional Limits for tasks assessed                      Exercises Total Joint Exercises Ankle Circles/Pumps: AROM;Other reps (comment) (30) Quad Sets: Both;20 reps Heel Slides: Seated;10 reps;Right Long Arc Quad: AROM;Right;20 reps Knee Flexion: AAROM;5  reps;Seated    General Comments        Pertinent Vitals/Pain Pain Assessment: 0-10 Pain Score: 1  Pain Location: R Knee Pain Descriptors / Indicators: Aching;Heaviness Pain Intervention(s): Limited activity within patient's tolerance    Home Living                      Prior Function            PT Goals (current goals can now be found in the care plan section) Acute Rehab PT Goals PT Goal Formulation: With patient Time For Goal Achievement: 09/09/14 Potential to Achieve Goals: Good Progress towards PT goals: Progressing toward goals    Frequency  BID    PT Plan Current plan remains appropriate    Co-evaluation             End of Session Equipment Utilized During Treatment: Gait belt Activity Tolerance: Patient tolerated treatment well Patient left: in chair;with call bell/phone within reach;with chair alarm set;with family/visitor present     Time: [id number] PT Time Calculation (min) (ACUTE ONLY): 30 min  Charges:  $Gait Training: 8-22 mins $Therapeutic Exercise: 8-22 mins                    G Codes:      Talisa Petrak  08/28/2014, 9:57 AM Silvio Sausedo, PTA

## 2014-08-28 NOTE — Outcomes Assessment (Signed)
Temp of 101.6 which was reduced with PRN tylenol, patient is A+O with no signs of distress.  Pain is controlled with current medications.  Voids without difficulty. av1's and teds in place.  Appears to have slept well.

## 2014-08-28 NOTE — Care Management Note (Signed)
Case Management Note  Patient Details  Name: Nancy York MRN: [id number] Date of Birth: 1946/04/23   Patient discharging home today. Lonn GeorgiaBarb Taylor RN with [facility] notified of patient discharge. No further RNCM needs.   Collie SiadAngela Alton Tremblay, RN 08/28/2014, 8:00 AM

## 2014-08-28 NOTE — Discharge Instructions (Signed)
° °TOTAL KNEE REPLACEMENT POSTOPERATIVE DIRECTIONS ° °Knee Rehabilitation, Guidelines Following Surgery  °Results after knee surgery are often greatly improved when you follow the exercise, range of motion and muscle strengthening exercises prescribed by your doctor. Safety measures are also important to protect the knee from further injury. Any time any of these exercises cause you to have increased pain or swelling in your knee joint, decrease the amount until you are comfortable again and slowly increase them. If you have problems or questions, call your caregiver or physical therapist for advice.  ° °HOME CARE INSTRUCTIONS  °Remove items at home which could result in a fall. This includes throw rugs or furniture in walking pathways.  °· Continue to use polar care unit on the knee for pain and swelling from surgery. You may notice swelling that will progress down to the foot and ankle.  This is normal after surgery.  Elevate the leg when you are not up walking on it.   °· Continue to use the breathing machine which will help keep your temperature down.  It is common for your temperature to cycle up and down following surgery, especially at night when you are not up moving around and exerting yourself.  The breathing machine keeps your lungs expanded and your temperature down. °· Do not place pillow under knee, focus on keeping the knee STRAIGHT while resting ° °DIET °You may resume your previous home diet once your are discharged from the hospital. ° °DRESSING / WOUND CARE / SHOWERING °You may start showering once staples have been removed. Change the surgical dressing as needed. ° °ACTIVITY °Walk with your walker as instructed. °Use walker as long as suggested by your caregivers. °Avoid periods of inactivity such as sitting longer than an hour when not asleep. This helps prevent blood clots.  °You may resume a sexual relationship in one month or when given the OK by your doctor.  °You may return to work once  you are cleared by your doctor.  °Do not drive a car for 6 weeks or until released by you surgeon.  °Do not drive while taking narcotics. ° °WEIGHT BEARING °Weight bearing as tolerated with assist device (walker, cane, etc) as directed, use it as long as suggested by your surgeon or therapist, typically at least 4-6 weeks. ° °POSTOPERATIVE CONSTIPATION PROTOCOL °Constipation - defined medically as fewer than three stools per week and severe constipation as less than one stool per week. ° °One of the most common issues patients have following surgery is constipation.  Even if you have a regular bowel pattern at home, your normal regimen is likely to be disrupted due to multiple reasons following surgery.  Combination of anesthesia, postoperative narcotics, change in appetite and fluid intake all can affect your bowels.  In order to avoid complications following surgery, here are some recommendations in order to help you during your recovery period. ° °Colace (docusate) - Pick up an over-the-counter form of Colace or another stool softener and take twice a day as long as you are requiring postoperative pain medications.  Take with a full glass of water daily.  If you experience loose stools or diarrhea, hold the colace until you stool forms back up.  If your symptoms do not get better within 1 week or if they get worse, check with your doctor. ° °Dulcolax (bisacodyl) - Pick up over-the-counter and take as directed by the product packaging as needed to assist with the movement of your bowels.  Take with a   full glass of water.  Use this product as needed if not relieved by Colace only.  ° °MiraLax (polyethylene glycol) - Pick up over-the-counter to have on hand.  MiraLax is a solution that will increase the amount of water in your bowels to assist with bowel movements.  Take as directed and can mix with a glass of water, juice, soda, coffee, or tea.  Take if you go more than two days without a movement. °Do not use  MiraLax more than once per day. Call your doctor if you are still constipated or irregular after using this medication for 7 days in a row. ° °If you continue to have problems with postoperative constipation, please contact the office for further assistance and recommendations.  If you experience "the worst abdominal pain ever" or develop nausea or vomiting, please contact the office immediatly for further recommendations for treatment. ° °ITCHING ° If you experience itching with your medications, try taking only a single pain pill, or even half a pain pill at a time.  You can also use Benadryl over the counter for itching or also to help with sleep.  ° °TED HOSE STOCKINGS °Wear the elastic stockings on both legs for  6 weeks following surgery during the day but you may remove then at night for sleeping. ° °MEDICATIONS °See your medication summary on the “After Visit Summary” that the nursing staff will review with you prior to discharge.  You may have some home medications which will be placed on hold until you complete the course of blood thinner medication.  It is important for you to complete the blood thinner medication as prescribed by your surgeon.  Continue your approved medications as instructed at time of discharge. ° °PRECAUTIONS °If you experience chest pain or shortness of breath - call 911 immediately for transfer to the hospital emergency department.  °If you develop a fever greater that 101 F, purulent drainage from wound, increased redness or drainage from wound, foul odor from the wound/dressing, or calf pain - CONTACT YOUR SURGEON.   °                                                °FOLLOW-UP APPOINTMENTS °Make sure you keep all of your appointments after your operation with your surgeon and caregivers. You should call the office at the above phone number and make an appointment for approximately two weeks after the date of your surgery or on the date instructed by your surgeon outlined in the  "After Visit Summary". ° ° °RANGE OF MOTION AND STRENGTHENING EXERCISES  °Rehabilitation of the knee is important following a knee injury or an operation. After just a few days of immobilization, the muscles of the thigh which control the knee become weakened and shrink (atrophy). Knee exercises are designed to build up the tone and strength of the thigh muscles and to improve knee motion. Often times heat used for twenty to thirty minutes before working out will loosen up your tissues and help with improving the range of motion but do not use heat for the first two weeks following surgery. These exercises can be done on a training (exercise) mat, on the floor, on a table or on a bed. Use what ever works the best and is most comfortable for you Knee exercises include:  °Leg Lifts - While your knee is   still immobilized in a splint or cast, you can do straight leg raises. Lift the leg to 60 degrees, hold for 3 sec, and slowly lower the leg. Repeat 10-20 times 2-3 times daily. Perform this exercise against resistance later as your knee gets better.  °Quad and Hamstring Sets - Tighten up the muscle on the front of the thigh (Quad) and hold for 5-10 sec. Repeat this 10-20 times hourly. Hamstring sets are done by pushing the foot backward against an object and holding for 5-10 sec. Repeat as with quad sets.  °· Leg Slides: Lying on your back, slowly slide your foot toward your buttocks, bending your knee up off the floor (only go as far as is comfortable). Then slowly slide your foot back down until your leg is flat on the floor again. °· Angel Wings: Lying on your back spread your legs to the side as far apart as you can without causing discomfort.  °A rehabilitation program following serious knee injuries can speed recovery and prevent re-injury in the future due to weakened muscles. Contact your doctor or a physical therapist for more information on knee rehabilitation.  ° °IF YOU ARE TRANSFERRED TO A SKILLED REHAB  FACILITY °If the patient is transferred to a skilled rehab facility following release from the hospital, a list of the current medications will be sent to the facility for the patient to continue.  When discharged from the skilled rehab facility, please have the facility set up the patient's Home Health Physical Therapy prior to being released. Also, the skilled facility will be responsible for providing the patient with their medications at time of release from the facility to include their pain medication, the muscle relaxants, and their blood thinner medication. If the patient is still at the rehab facility at time of the two week follow up appointment, the skilled rehab facility will also need to assist the patient in arranging follow up appointment in our office and any transportation needs. ° °MAKE SURE YOU:  °Understand these instructions.  °Get help right away if you are not doing well or get worse.  ° ° ° ° °

## 2014-08-28 NOTE — Progress Notes (Signed)
   Subjective: 3 Days Post-Op Procedure(s) (LRB): TOTAL KNEE ARTHROPLASTY (Right) Patient reports pain as 1 on 0-10 scale.   Patient is well, and has had no acute complaints or problems We will start therapy today.  Plan is to go Home after hospital stay. no nausea and no vomiting Patient denies any chest pains or shortness of breath. Objective: Vital signs in last 24 hours: Temp:  [98.1 F (36.7 C)-101.6 F (38.7 C)] 98.8 F (37.1 C) (05/05 0704) Pulse Rate:  [83-101] 90 (05/05 0704) Resp:  [18] 18 (05/05 0704) BP: (151-178)/(55-66) 153/66 mmHg (05/05 0704) SpO2:  [94 %-97 %] 96 % (05/05 0704) well approximated incision Heels are non tender and elevated off the bed using rolled towels Intake/Output from previous day: 05/04 0701 - 05/05 0700 In: 1177 [P.O.:240; I.V.:937] Out: -  Intake/Output this shift:     Recent Labs  08/26/14 0433 08/27/14 0425 08/28/14 0454  HGB 11.5* 12.0 11.6*    Recent Labs  08/27/14 0425 08/28/14 0454  WBC 9.7 10.0  RBC 4.24 4.12  HCT 36.2 35.2  PLT 239 251    Recent Labs  08/26/14 0433 08/27/14 0425  NA 139 137  K 4.3 3.6  CL 101 97*  CO2 32 31  BUN 10 8  CREATININE 0.65 0.64  GLUCOSE 122* 161*  CALCIUM 8.9 8.7*    Recent Labs  08/27/14 0425  INR 1.10    EXAM General - Patient is Alert, Appropriate and Oriented Extremity - Neurologically intact ABD soft Neurovascular intact Sensation intact distally Intact pulses distally Dorsiflexion/Plantar flexion intact No evidence of any DVTs to lower extremity. Calves are nontender Dressing - scant drainage Motor Function - intact, moving foot and toes well on exam. Patient has good quad tone. Able to do straight leg raises on  Past Medical History  Diagnosis Date  . Hypertension   . Heart murmur   . GERD (gastroesophageal reflux disease)   . Arthritis     Assessment/Plan: 3 Days Post-Op Procedure(s) (LRB): TOTAL KNEE ARTHROPLASTY (Right) Active Problems:   OA  (osteoarthritis) of knee   Total knee replacement status  Estimated body mass index is 37.19 kg/(m^2) as calculated from the following:   Height as of this encounter: 5\' 2"  (1.575 m).   Weight as of this encounter: 92.262 kg (203 lb 6.4 oz). Up with therapy  Labs: None DVT Prophylaxis - Lovenox, Foot Pumps and TED hose Weight-Bearing as tolerated to right leg D/C O2 and Pulse OX and try on Room [employer]  Emmanuela Ghazi R. [facility] Center[facility] PA [facility] 08/28/2014, 7:22 AM

## 2014-08-28 NOTE — Plan of Care (Signed)
Problem: Consults Goal: Diagnosis- Total Joint Replacement Outcome: Progressing Primary Total Knee     

## 2014-09-01 ENCOUNTER — Encounter: Payer: Self-pay | Admitting: Orthopedic Surgery

## 2015-02-10 ENCOUNTER — Other Ambulatory Visit: Payer: Self-pay | Admitting: Internal Medicine

## 2015-02-10 DIAGNOSIS — Z1231 Encounter for screening mammogram for malignant neoplasm of breast: Secondary | ICD-10-CM

## 2015-03-26 ENCOUNTER — Ambulatory Visit
Admission: RE | Admit: 2015-03-26 | Discharge: 2015-03-26 | Disposition: A | Discharge status: Home / Self Care | Payer: Commercial Managed Care - HMO | Source: Ambulatory Visit | Attending: Internal Medicine | Admitting: Internal Medicine

## 2015-03-26 ENCOUNTER — Other Ambulatory Visit: Payer: Self-pay | Admitting: Internal Medicine

## 2015-03-26 DIAGNOSIS — Z1231 Encounter for screening mammogram for malignant neoplasm of breast: Secondary | ICD-10-CM | POA: Insufficient documentation

## 2015-06-18 ENCOUNTER — Encounter
Admission: RE | Admit: 2015-06-18 | Discharge: 2015-06-18 | Disposition: A | Discharge status: Home / Self Care | Payer: Commercial Managed Care - HMO | Source: Ambulatory Visit | Attending: Orthopedic Surgery | Admitting: Orthopedic Surgery

## 2015-06-18 DIAGNOSIS — Z01810 Encounter for preprocedural cardiovascular examination: Secondary | ICD-10-CM | POA: Insufficient documentation

## 2015-06-18 DIAGNOSIS — I10 Essential (primary) hypertension: Secondary | ICD-10-CM | POA: Insufficient documentation

## 2015-06-18 DIAGNOSIS — Z01812 Encounter for preprocedural laboratory examination: Secondary | ICD-10-CM | POA: Insufficient documentation

## 2015-06-18 HISTORY — DX: Diverticulosis of intestine, part unspecified, without perforation or abscess without bleeding: K57.90

## 2015-06-18 LAB — URINALYSIS COMPLETE WITH MICROSCOPIC (ARMC ONLY)
Bilirubin Urine: NEGATIVE
Glucose, UA: NEGATIVE mg/dL
Hgb urine dipstick: NEGATIVE
KETONES UR: NEGATIVE mg/dL
Leukocytes, UA: NEGATIVE
Nitrite: NEGATIVE
PH: 7 (ref 5.0–8.0)
PROTEIN: NEGATIVE mg/dL
Specific Gravity, Urine: 1.015 (ref 1.005–1.030)

## 2015-06-18 LAB — BASIC METABOLIC PANEL
ANION GAP: 12 (ref 5–15)
BUN: 14 mg/dL (ref 6–20)
CALCIUM: 9.7 mg/dL (ref 8.9–10.3)
CO2: 28 mmol/L (ref 22–32)
Chloride: 102 mmol/L (ref 101–111)
Creatinine, Ser: 0.67 mg/dL (ref 0.44–1.00)
GFR calc non Af Amer: >60 mL/min (ref >60)
Glucose, Bld: 117 mg/dL — ABNORMAL HIGH (ref 65–99)
Potassium: 3.4 mmol/L — ABNORMAL LOW (ref 3.5–5.1)
SODIUM: 142 mmol/L (ref 135–145)

## 2015-06-18 LAB — CBC
HCT: 39.8 % (ref 35.0–47.0)
Hemoglobin: 13.4 g/dL (ref 12.0–16.0)
MCH: 28.6 pg (ref 26.0–34.0)
MCHC: 33.8 g/dL (ref 32.0–36.0)
MCV: 84.7 fL (ref 80.0–100.0)
Platelets: 254 10*3/uL (ref 150–440)
RBC: 4.69 MIL/uL (ref 3.80–5.20)
RDW: 14 % (ref 11.5–14.5)
WBC: 8.2 10*3/uL (ref 3.6–11.0)

## 2015-06-18 LAB — TYPE AND SCREEN
ABO/RH(D): A POS
ANTIBODY SCREEN: NEGATIVE

## 2015-06-18 LAB — PROTIME-INR
INR: 0.99
Prothrombin Time: 13.3 seconds (ref 11.4–15.0)

## 2015-06-18 LAB — SEDIMENTATION RATE: Sed Rate: 29 mm/hr (ref 0–30)

## 2015-06-18 LAB — ABO/RH: ABO/RH(D): A POS

## 2015-06-18 LAB — APTT: APTT: 32 s (ref 24–36)

## 2015-06-18 LAB — SURGICAL PCR SCREEN
MRSA, PCR: POSITIVE — AB
STAPHYLOCOCCUS AUREUS: POSITIVE — AB

## 2015-06-18 NOTE — Patient Instructions (Signed)
  Your procedure is scheduled on:June 24, 2015 (Wednesday) Report to Day Surgery.[facility]) Second Floor To find out your arrival time please call [phone] between 1PM - 3PM on June 23, 2015 (Tuesday).  Remember: Instructions that are not followed completely may result in serious medical risk, up to and including death, or upon the discretion of your surgeon and anesthesiologist your surgery may need to be rescheduled.    __x_ 1. Do not eat food or drink liquids after midnight. No gum chewing or hard candies.     __x__ 2. No Alcohol for 24 hours before or after surgery.   ____ 3. Bring all medications with you on the day of surgery if instructed.    __x__ 4. Notify your doctor if there is any change in your medical condition     (cold, fever, infections).     Do not wear jewelry, make-up, hairpins, clips or nail polish.  Do not wear lotions, powders, or perfumes. You may wear deodorant.  Do not shave 48 hours prior to surgery. Men may shave face and neck.  Do not bring valuables to the hospital.    [facility] is not responsible for any belongings or valuables.               Contacts, dentures or bridgework may not be worn into surgery.  Leave your suitcase in the car. After surgery it may be brought to your room.  For patients admitted to the hospital, discharge time is determined by your                treatment team.   Patients discharged the day of surgery will not be allowed to drive home.   Please read over the following fact sheets that you were given:   MRSA Information and Surgical Site Infection Prevention   ____ Take these medicines the morning of surgery with A SIP OF WATER:    1. Metoprolol  2. Pantoprazole (Pantoprazole at bedtime on February 28)  3.   4.  5.  6.  ____ Fleet Enema (as directed)   _x___ Use CHG Soap as directed  ____ Use inhalers on the day of surgery  ____ Stop metformin 2 days prior to surgery    ____ Take 1/2 of usual  insulin dose the night before surgery and none on the morning of surgery.   __x__ Stop Coumadin/Plavix/aspirin on (Stop Aspirin now)  __x__ Stop Anti-inflammatories on (Stop Meloxicam now, NO NSAIDS) Tylenol ok to take for pain if needed)    _x_ Stop supplements until after surgery.  (Stop Flaxseed, Vitamin B-12, Osteo-Bi-Flex, Ginkgo, and cholestoff now)  ____ Bring C-Pap to the hospital.

## 2015-06-18 NOTE — Pre-Procedure Instructions (Signed)
Positive MRSA results faxed to Dr. Ernest Pine office.

## 2015-06-19 NOTE — H&P (Addendum)
Nancy York, Nancy York DOB: Mar 07, 1946  Progress Notes Encounter Date: 06/11/2015   Nancy York., MD  Orthopedic Surgery   Chief Complaint:     Chief Complaint  Patient presents with  . Hip Pain    Left hip pain, discuss tha    Reason for Visit: The patient is a 70 y.o. female who presents today for reevaluation of her left hip.  She underwent open reduction and internal fixation of a left intertrochanteric femur fracture by Dr. Reita Chard in 2007. She reports progressive left hip and groin pain that is aggravated by weightbearing activity. She has appreciated some decrease in her left hip range of motion. She has been ambulating with a significant limp, but does not currently use any ambulatory aids. She has not appreciated any significant improvement despite use of nonsteroidal anti-inflammatory medications, Tylenol, and activity modification. The left hip pain has progressed to the point that it is significantly interfering with her activities of daily living.  Medications:  CurrentMedications         Current Outpatient Prescriptions  Medication Sig Dispense Refill  . acetaminophen (TYLENOL) 500 mg capsule Take 500 mg by mouth every 4 (four) hours as needed for Fever.    Marland Kitchen aspirin 81 MG EC tablet Take 81 mg by mouth once daily.    . calcium citrate-vitamin D3 (CITRACAL+D) 315-200 mg-unit tablet Take 2 tablets by mouth 2 (two) times daily with meals.    . cyanocobalamin (VITAMIN B12) 1000 MCG tablet Take 1,000 mcg by mouth once daily.    Marland Kitchen FIBER CHOICE, INULIN, ORAL Take 1 tablet by mouth once daily.     . flaxseed 1,000 mg Cap Take 1,000 mg by mouth 3 (three) times daily.     Marland Kitchen ginkgo biloba 40 mg Tab Take 40 mg by mouth once daily.     Marland Kitchen GLUCOSAMINE/D3/BOSWELLIA SERRA (OSTEO BI-FLEX, 5-LOXIN, ORAL) Take 1 caplet by mouth 2 (two) times daily.     . hydroCHLOROthiazide (HYDRODIURIL) 25 MG tablet Take 1 tablet (25 mg total) by mouth once daily. 90 tablet 3   . loratadine (CLARITIN) 5 mg chewable tablet Take 5 mg by mouth once daily.    . meloxicam (MOBIC) 7.5 MG tablet Take 1 tablet (7.5 mg total) by mouth 2 (two) times daily. 60 tablet 0  . metoprolol succinate (TOPROL-XL) 25 MG XL tablet Take 1 tablet (25 mg total) by mouth once daily. 90 tablet 3  . pantoprazole (PROTONIX) 40 MG DR tablet Take 1 tablet (40 mg total) by mouth once daily. 90 tablet 3  . PLANT STANOL ESTER (CHOLEST OFF ORAL) Take by mouth.     No current facility-administered medications for this visit.       Allergies:      Allergies  Allergen Reactions  . Penicillin G Rash  . Statins-Hmg-Coa Reductase Inhibitors Other (See Comments)    "leg cramps"  . Adhesive Tape-Silicones Rash    Past Medical History:      Past Medical History  Diagnosis Date  . Asthma without status asthmaticus   . Chronic cough   . Environmental allergies   . Fibroids     uterine  . GERD (gastroesophageal reflux disease)     history of peptic ulcer disease  . History of migraine headaches   . Hyperlipidemia, unspecified   . Hypertension   . Menopausal syndrome   . Osteoarthritis     with knee pain  . Thyromegaly     Past Surgical History:  Past Surgical History  Procedure Laterality Date  . Tonsillectomy    . Incision tendon sheath for trigger finger    . Knee arthroscopy    . Bunion correction Right   . Rotator cuff surgery    . Endoscopic carpal tunnel release Bilateral   . Right total knee arthroplasty using computer-assisted navigation Right 08/25/2014    Dr.Kionna Brier  . Left hip fracture  2007    Dr Myra Rude    Social History:  SocialHistory   Social History        Social History  . Marital status: Married    Spouse name: Merlyn Albert  . Number of children: 1  . Years of education: 65       Occupational History  . Retired         Social History Main Topics  . Smoking status: Never Smoker  .  Smokeless tobacco: Never Used  . Alcohol use No  . Drug use: No  . Sexual activity: Yes    Partners: Male       Other Topics Concern  . Not on file   Social History Narrative      Family History:      Family History  Problem Relation Age of Onset  . Hypertension Mother   . Diabetes type II Father   . Prostate cancer Father   . Diabetes mellitus Father     Review of Systems: A comprehensive 14 point ROS was performed, reviewed, and the pertinent orthopaedic findings are documented in the HPI.  Exam     Visit Vitals  . BP 138/76  . Ht 157.5 cm ( )  . Wt 84.4 kg (186 lb)  . LMP (LMP Unknown)  . BMI 34.02 kg/m2    General:  Well-developed, well-nourished female seen in no acute distress. Antalgic gait with evidence of slight abductor lurch.  HEENT:  Atraumatic, normocephalic. Pupils are equal and reactive to light. Extraocular motion is intact. Sclera are clear. Oropharynx is clear with moist mucosa.  Neck:  Supple, nontender, and with good ROM. No thyromegaly, adenopathy, JVD, or carotid bruits.  Lungs:  Clear to auscultation bilaterally.  Cardiovascular:  Regular rate and rhythm. Normal S1, S2. No murmur . No appreciable gallops or rubs. Peripheral pulses are palpable. No lower extremity edema. Homan`s test is negative.  Abdomen:  Soft, nontender, nondistended. Bowel sounds are present.  Spine: Good range of motion with both flexion and extension. Negative scoliosis. Sacroiliac joints are nontender. Negative paraspinous tenderness. Negative flip test. Negative straight leg raise. Negative Patrick`s test.  Extremities: Good strength, stability, and range of motion of the upper extremities. Good range of motion of the knees and ankles.  Left Hip: Pelvic tilt:  Slight pelvic tilt Limb lengths:  The left lower extremity is approximately 1/2 inch shorter than the right. Soft tissue swelling: Negative Erythema:   Negative Crepitance:  Negative Tenderness:  Greater trochanter is nontender to palpation. Moderate pain is elicited by axial compression or extremes of rotation. Atrophy:  No atrophy. Good hip flexor and abductor strength. Range of Motion: EXT/FLEX: 10/0/110   ADD/ABD: 20/0/20   IR/ER: 20/0/20  Vascular: Peripheral pulses are palpable. Good capillary refill. No gross pretibial or ankle edema. Homans test is negative.  Neurologic:  Awake, alert, and oriented.  Sensory function is intact to pinprick and light touch.  Motor strength is judged to be 5/5.  Motor coordination is grossly within normal limits.  No apparent clonus. No tremor.   X-rays: I ordered  and interpreted AP pelvis, AP and lateral radiographs of the left hip that were obtained in the office today. A dynamic compression screw is in place with angulation of the lag screw suggestive of hardware failure. There is significant narrowing of the cartilage space with bone-on-bone articulation superiorly. Subchondral sclerosis is noted.   Impression: Degenerative arthrosis of the left hip Retained hardware status post open reduction and internal fixation of a left intertrochanteric femur fracture  Plan:   The findings were discussed in detail with the patient. The patient was given informational material on total hip replacement. Conservative treatment options were reviewed with the patient. We discussed the risks and benefits of surgical intervention. The usual perioperative course was also discussed in detail. The patient expressed understanding of the risks and benefits of surgical intervention and would like to proceed with plans for hardware removal and left total hip arthroplasty.  MEDICAL CLEARANCE: Per anesthesiology ACTIVITIES:  As tolerated. WORK STATUS: Not applicable.  THERAPY: Preoperative physical therapy evaluation. MEDICATIONS:    New Prescriptions   No medications on file   FOLLOW-UP: Return for  postoperative follow-up.  Tationa Stech P. Angie York., M.D.  This note was generated in part with voice recognition software and I apologize for any typographical errors that were not detected and corrected.      Electronically signed by Shari Heritage., MD at 06/14/2015 9:00 PM      Office Visit on 06/11/2015      Department  Name Address Phone Fax  [facility] [address] [place] [address] [phone]  Service Location  Name Address      [facility] MEDICINE [facility] [address] [place] [address]

## 2015-06-20 LAB — URINE CULTURE

## 2015-06-20 NOTE — Pre-Procedure Instructions (Signed)
EKG sent to anesthesia for review

## 2015-06-22 NOTE — Pre-Procedure Instructions (Signed)
EKG REVIEWED

## 2015-06-24 ENCOUNTER — Inpatient Hospital Stay
Admission: RE | Admit: 2015-06-24 | Discharge: 2015-06-26 | DRG: 470 | Disposition: A | Discharge status: Home Health | Payer: Commercial Managed Care - HMO | Source: Ambulatory Visit | Attending: Orthopedic Surgery | Admitting: Orthopedic Surgery

## 2015-06-24 ENCOUNTER — Inpatient Hospital Stay: Payer: Commercial Managed Care - HMO

## 2015-06-24 ENCOUNTER — Encounter
Admission: RE | Disposition: A | Discharge status: Home Health | Payer: Self-pay | Source: Ambulatory Visit | Attending: Orthopedic Surgery

## 2015-06-24 ENCOUNTER — Inpatient Hospital Stay: Payer: Commercial Managed Care - HMO | Admitting: Certified Registered Nurse Anesthetist

## 2015-06-24 ENCOUNTER — Encounter: Payer: Self-pay | Admitting: Orthopedic Surgery

## 2015-06-24 DIAGNOSIS — Z79899 Other long term (current) drug therapy: Secondary | ICD-10-CM | POA: Diagnosis not present

## 2015-06-24 DIAGNOSIS — M19019 Primary osteoarthritis, unspecified shoulder: Secondary | ICD-10-CM | POA: Diagnosis present

## 2015-06-24 DIAGNOSIS — J45909 Unspecified asthma, uncomplicated: Secondary | ICD-10-CM | POA: Diagnosis present

## 2015-06-24 DIAGNOSIS — R011 Cardiac murmur, unspecified: Secondary | ICD-10-CM | POA: Diagnosis present

## 2015-06-24 DIAGNOSIS — I10 Essential (primary) hypertension: Secondary | ICD-10-CM | POA: Diagnosis present

## 2015-06-24 DIAGNOSIS — G43909 Migraine, unspecified, not intractable, without status migrainosus: Secondary | ICD-10-CM | POA: Diagnosis present

## 2015-06-24 DIAGNOSIS — M170 Bilateral primary osteoarthritis of knee: Secondary | ICD-10-CM | POA: Diagnosis present

## 2015-06-24 DIAGNOSIS — K219 Gastro-esophageal reflux disease without esophagitis: Secondary | ICD-10-CM | POA: Diagnosis present

## 2015-06-24 DIAGNOSIS — Z888 Allergy status to other drugs, medicaments and biological substances status: Secondary | ICD-10-CM

## 2015-06-24 DIAGNOSIS — N951 Menopausal and female climacteric states: Secondary | ICD-10-CM | POA: Diagnosis present

## 2015-06-24 DIAGNOSIS — M1612 Unilateral primary osteoarthritis, left hip: Secondary | ICD-10-CM | POA: Diagnosis present

## 2015-06-24 DIAGNOSIS — E785 Hyperlipidemia, unspecified: Secondary | ICD-10-CM | POA: Diagnosis present

## 2015-06-24 DIAGNOSIS — Z880 Allergy status to penicillin: Secondary | ICD-10-CM

## 2015-06-24 DIAGNOSIS — G8929 Other chronic pain: Secondary | ICD-10-CM | POA: Diagnosis present

## 2015-06-24 DIAGNOSIS — Z7982 Long term (current) use of aspirin: Secondary | ICD-10-CM | POA: Diagnosis not present

## 2015-06-24 DIAGNOSIS — Z96649 Presence of unspecified artificial hip joint: Secondary | ICD-10-CM

## 2015-06-24 DIAGNOSIS — Z8711 Personal history of peptic ulcer disease: Secondary | ICD-10-CM

## 2015-06-24 DIAGNOSIS — Z96651 Presence of right artificial knee joint: Secondary | ICD-10-CM | POA: Diagnosis present

## 2015-06-24 HISTORY — PX: HARDWARE REMOVAL: SHX979

## 2015-06-24 HISTORY — PX: TOTAL HIP ARTHROPLASTY: SHX124

## 2015-06-24 SURGERY — ARTHROPLASTY, HIP, TOTAL,POSTERIOR APPROACH
Anesthesia: Spinal | Laterality: Left | Wound class: Clean

## 2015-06-24 MED ORDER — FERROUS SULFATE 325 (65 FE) MG PO TABS
325.0000 mg | ORAL_TABLET | Freq: Two times a day (BID) | ORAL | Status: DC
  Administered 2015-06-25 – 2015-06-26 (×3): 325 mg via ORAL
  Filled 2015-06-24 (×3): qty 1

## 2015-06-24 MED ORDER — BUPIVACAINE IN DEXTROSE 0.75-8.25 % IT SOLN
INTRATHECAL | Status: DC | PRN
  Administered 2015-06-24: 1.6 mL via INTRATHECAL

## 2015-06-24 MED ORDER — LORATADINE 10 MG PO TABS: 10.0000 mg | ORAL_TABLET | Freq: Every day | ORAL | Status: DC

## 2015-06-24 MED ORDER — MORPHINE SULFATE (PF) 2 MG/ML IV SOLN
2.0000 mg | INTRAVENOUS | Status: DC | PRN
  Administered 2015-06-24: 2 mg via INTRAVENOUS
  Filled 2015-06-24: qty 1

## 2015-06-24 MED ORDER — SENNOSIDES-DOCUSATE SODIUM 8.6-50 MG PO TABS
1.0000 | ORAL_TABLET | Freq: Two times a day (BID) | ORAL | Status: DC
  Administered 2015-06-24 – 2015-06-26 (×3): 1 via ORAL
  Filled 2015-06-24 (×3): qty 1

## 2015-06-24 MED ORDER — CLINDAMYCIN PHOSPHATE 900 MG/50ML IV SOLN
900.0000 mg | Freq: Once | INTRAVENOUS | Status: AC
  Administered 2015-06-24: 900 mg via INTRAVENOUS

## 2015-06-24 MED ORDER — ONDANSETRON HCL 4 MG PO TABS: 4.0000 mg | ORAL_TABLET | Freq: Four times a day (QID) | ORAL | Status: DC | PRN

## 2015-06-24 MED ORDER — ONDANSETRON HCL 4 MG/2ML IJ SOLN
4.0000 mg | Freq: Once | INTRAMUSCULAR | Status: AC | PRN
  Administered 2015-06-24: 4 mg via INTRAVENOUS

## 2015-06-24 MED ORDER — METOPROLOL SUCCINATE ER 25 MG PO TB24
25.0000 mg | ORAL_TABLET | Freq: Every day | ORAL | Status: DC
  Administered 2015-06-25 – 2015-06-26 (×2): 25 mg via ORAL
  Filled 2015-06-24 (×2): qty 1

## 2015-06-24 MED ORDER — DIPHENHYDRAMINE HCL 12.5 MG/5ML PO ELIX
12.5000 mg | ORAL_SOLUTION | ORAL | Status: DC | PRN
Start: 2015-06-24 — End: 2015-06-26

## 2015-06-24 MED ORDER — MENTHOL 3 MG MT LOZG
1.0000 | LOZENGE | OROMUCOSAL | Status: DC | PRN
  Filled 2015-06-24: qty 9

## 2015-06-24 MED ORDER — MIDAZOLAM HCL 5 MG/5ML IJ SOLN
INTRAMUSCULAR | Status: DC | PRN
  Administered 2015-06-24 (×2): 1 mg via INTRAVENOUS

## 2015-06-24 MED ORDER — PROPOFOL 500 MG/50ML IV EMUL
INTRAVENOUS | Status: DC | PRN
  Administered 2015-06-24: 50 ug/kg/min via INTRAVENOUS

## 2015-06-24 MED ORDER — ACETAMINOPHEN 325 MG PO TABS: 650.0000 mg | ORAL_TABLET | Freq: Four times a day (QID) | ORAL | Status: DC | PRN

## 2015-06-24 MED ORDER — NEOMYCIN-POLYMYXIN B GU 40-200000 IR SOLN
Status: AC
  Filled 2015-06-24: qty 20

## 2015-06-24 MED ORDER — PHENOL 1.4 % MT LIQD
1.0000 | OROMUCOSAL | Status: DC | PRN
  Filled 2015-06-24: qty 177

## 2015-06-24 MED ORDER — ONDANSETRON HCL 4 MG/2ML IJ SOLN: 4.0000 mg | Freq: Four times a day (QID) | INTRAMUSCULAR | Status: DC | PRN

## 2015-06-24 MED ORDER — EPHEDRINE SULFATE 50 MG/ML IJ SOLN
INTRAMUSCULAR | Status: DC | PRN
  Administered 2015-06-24 (×2): 5 mg via INTRAVENOUS

## 2015-06-24 MED ORDER — NEOMYCIN-POLYMYXIN B GU 40-200000 IR SOLN
Status: DC | PRN
  Administered 2015-06-24: 16 mL

## 2015-06-24 MED ORDER — BISACODYL 10 MG RE SUPP: 10.0000 mg | Freq: Every day | RECTAL | Status: DC | PRN

## 2015-06-24 MED ORDER — INULIN 1.5 G PO CHEW: CHEWABLE_TABLET | Freq: Once | ORAL | Status: DC

## 2015-06-24 MED ORDER — CALCIUM CITRATE-VITAMIN D 500-400 MG-UNIT PO CHEW
0.5000 | CHEWABLE_TABLET | Freq: Three times a day (TID) | ORAL | Status: DC
  Administered 2015-06-24 – 2015-06-26 (×5): 0.5 via ORAL
  Filled 2015-06-24 (×6): qty 1

## 2015-06-24 MED ORDER — SODIUM CHLORIDE 0.9 % IV SOLN
INTRAVENOUS | Status: DC
Start: 2015-06-24 — End: 2015-06-25
  Administered 2015-06-24 – 2015-06-25 (×2): via INTRAVENOUS

## 2015-06-24 MED ORDER — SODIUM CHLORIDE 0.9 % IV SOLN
10000.0000 ug | INTRAVENOUS | Status: DC | PRN
  Administered 2015-06-24: 10 ug/min via INTRAVENOUS

## 2015-06-24 MED ORDER — KETAMINE HCL 50 MG/ML IJ SOLN
INTRAMUSCULAR | Status: DC | PRN
  Administered 2015-06-24: 50 mg via INTRAMUSCULAR

## 2015-06-24 MED ORDER — ACETAMINOPHEN 650 MG RE SUPP: 650.0000 mg | Freq: Four times a day (QID) | RECTAL | Status: DC | PRN

## 2015-06-24 MED ORDER — VITAMIN B-12 1000 MCG PO TABS
1000.0000 ug | ORAL_TABLET | Freq: Every day | ORAL | Status: DC
  Administered 2015-06-25 – 2015-06-26 (×2): 1000 ug via ORAL
  Filled 2015-06-24 (×2): qty 1

## 2015-06-24 MED ORDER — ALUM & MAG HYDROXIDE-SIMETH 200-200-20 MG/5ML PO SUSP: 30.0000 mL | ORAL | Status: DC | PRN

## 2015-06-24 MED ORDER — ONDANSETRON HCL 4 MG/2ML IJ SOLN
INTRAMUSCULAR | Status: AC
  Administered 2015-06-24: 4 mg via INTRAVENOUS
  Filled 2015-06-24: qty 2

## 2015-06-24 MED ORDER — PHENYLEPHRINE HCL 10 MG/ML IJ SOLN
INTRAMUSCULAR | Status: DC | PRN
  Administered 2015-06-24 (×6): 100 ug via INTRAVENOUS

## 2015-06-24 MED ORDER — MECLIZINE HCL 25 MG PO TABS: 25.0000 mg | ORAL_TABLET | Freq: Three times a day (TID) | ORAL | Status: DC | PRN

## 2015-06-24 MED ORDER — CELECOXIB 200 MG PO CAPS
200.0000 mg | ORAL_CAPSULE | Freq: Two times a day (BID) | ORAL | Status: DC
  Administered 2015-06-24 – 2015-06-26 (×4): 200 mg via ORAL
  Filled 2015-06-24 (×4): qty 1

## 2015-06-24 MED ORDER — FLEET ENEMA 7-19 GM/118ML RE ENEM: 1.0000 | ENEMA | Freq: Once | RECTAL | Status: DC | PRN

## 2015-06-24 MED ORDER — LIDOCAINE HCL (CARDIAC) 20 MG/ML IV SOLN
INTRAVENOUS | Status: DC | PRN
  Administered 2015-06-24: 60 mg via INTRAVENOUS

## 2015-06-24 MED ORDER — FENTANYL CITRATE (PF) 100 MCG/2ML IJ SOLN
INTRAMUSCULAR | Status: AC
  Administered 2015-06-24: 25 ug via INTRAVENOUS
  Filled 2015-06-24: qty 2

## 2015-06-24 MED ORDER — ENOXAPARIN SODIUM 30 MG/0.3ML ~~LOC~~ SOLN
30.0000 mg | Freq: Two times a day (BID) | SUBCUTANEOUS | Status: DC
Start: 2015-06-25 — End: 2015-06-26
  Administered 2015-06-25 – 2015-06-26 (×3): 30 mg via SUBCUTANEOUS
  Filled 2015-06-24 (×3): qty 0.3

## 2015-06-24 MED ORDER — FLAX SEED OIL 1000 MG PO CAPS: 1.0000 | ORAL_CAPSULE | Freq: Three times a day (TID) | ORAL | Status: DC

## 2015-06-24 MED ORDER — MAGNESIUM HYDROXIDE 400 MG/5ML PO SUSP: 30.0000 mL | Freq: Every day | ORAL | Status: DC | PRN

## 2015-06-24 MED ORDER — VANCOMYCIN HCL IN DEXTROSE 1-5 GM/200ML-% IV SOLN
INTRAVENOUS | Status: AC
  Administered 2015-06-24: 1000 mg via INTRAVENOUS
  Filled 2015-06-24: qty 200

## 2015-06-24 MED ORDER — ACETAMINOPHEN 10 MG/ML IV SOLN
1000.0000 mg | Freq: Four times a day (QID) | INTRAVENOUS | Status: AC
  Administered 2015-06-24 – 2015-06-25 (×4): 1000 mg via INTRAVENOUS
  Filled 2015-06-24 (×4): qty 100

## 2015-06-24 MED ORDER — PROPOFOL 10 MG/ML IV BOLUS
INTRAVENOUS | Status: DC | PRN
  Administered 2015-06-24 (×2): 20 mg via INTRAVENOUS
  Administered 2015-06-24: 30 mg via INTRAVENOUS
  Administered 2015-06-24: 20 mg via INTRAVENOUS

## 2015-06-24 MED ORDER — TRAMADOL HCL 50 MG PO TABS
50.0000 mg | ORAL_TABLET | ORAL | Status: DC | PRN
  Administered 2015-06-24: 50 mg via ORAL
  Filled 2015-06-24: qty 1

## 2015-06-24 MED ORDER — TRANEXAMIC ACID 1000 MG/10ML IV SOLN
1000.0000 mg | INTRAVENOUS | Status: AC
  Administered 2015-06-24: 1000 mg via INTRAVENOUS
  Filled 2015-06-24: qty 10

## 2015-06-24 MED ORDER — FENTANYL CITRATE (PF) 100 MCG/2ML IJ SOLN
25.0000 ug | INTRAMUSCULAR | Status: DC | PRN
  Administered 2015-06-24 (×4): 25 ug via INTRAVENOUS

## 2015-06-24 MED ORDER — CLINDAMYCIN PHOSPHATE 600 MG/50ML IV SOLN
600.0000 mg | Freq: Four times a day (QID) | INTRAVENOUS | Status: AC
  Administered 2015-06-24 – 2015-06-25 (×4): 600 mg via INTRAVENOUS
  Filled 2015-06-24 (×4): qty 50

## 2015-06-24 MED ORDER — CALCIUM CITRATE-VITAMIN D 315-200 MG-UNIT PO TABS: 1.0000 | ORAL_TABLET | Freq: Three times a day (TID) | ORAL | Status: DC

## 2015-06-24 MED ORDER — OXYCODONE HCL 5 MG PO TABS
5.0000 mg | ORAL_TABLET | ORAL | Status: DC | PRN
  Administered 2015-06-24 – 2015-06-26 (×4): 5 mg via ORAL
  Filled 2015-06-24 (×3): qty 1

## 2015-06-24 MED ORDER — METOCLOPRAMIDE HCL 10 MG PO TABS
10.0000 mg | ORAL_TABLET | Freq: Three times a day (TID) | ORAL | Status: DC
  Administered 2015-06-24 – 2015-06-26 (×6): 10 mg via ORAL
  Filled 2015-06-24 (×7): qty 1

## 2015-06-24 MED ORDER — CLINDAMYCIN PHOSPHATE 900 MG/50ML IV SOLN
INTRAVENOUS | Status: AC
  Filled 2015-06-24: qty 50

## 2015-06-24 MED ORDER — ACETAMINOPHEN 10 MG/ML IV SOLN
INTRAVENOUS | Status: AC
  Filled 2015-06-24: qty 100

## 2015-06-24 MED ORDER — LACTATED RINGERS IV SOLN
INTRAVENOUS | Status: DC
  Administered 2015-06-24: 50 mL/h via INTRAVENOUS
  Administered 2015-06-24: 16:00:00 via INTRAVENOUS

## 2015-06-24 MED ORDER — LORATADINE 10 MG PO TABS
10.0000 mg | ORAL_TABLET | Freq: Every day | ORAL | Status: DC
  Administered 2015-06-25 – 2015-06-26 (×2): 10 mg via ORAL
  Filled 2015-06-24 (×2): qty 1

## 2015-06-24 MED ORDER — VANCOMYCIN HCL IN DEXTROSE 1-5 GM/200ML-% IV SOLN
1000.0000 mg | Freq: Once | INTRAVENOUS | Status: AC
  Administered 2015-06-24: 1000 mg via INTRAVENOUS

## 2015-06-24 MED ORDER — FENTANYL CITRATE (PF) 100 MCG/2ML IJ SOLN
INTRAMUSCULAR | Status: DC | PRN
  Administered 2015-06-24 (×3): 50 ug via INTRAVENOUS

## 2015-06-24 MED ORDER — PLANT STEROLS AND STANOLS 450 MG PO TABS: ORAL_TABLET | Freq: Two times a day (BID) | ORAL | Status: DC

## 2015-06-24 MED ORDER — TRANEXAMIC ACID 1000 MG/10ML IV SOLN
1000.0000 mg | Freq: Once | INTRAVENOUS | Status: AC
  Administered 2015-06-24: 1000 mg via INTRAVENOUS
  Filled 2015-06-24: qty 10

## 2015-06-24 MED ORDER — HYDROCHLOROTHIAZIDE 25 MG PO TABS
25.0000 mg | ORAL_TABLET | Freq: Every day | ORAL | Status: DC
  Administered 2015-06-26: 25 mg via ORAL
  Filled 2015-06-24 (×2): qty 1

## 2015-06-24 SURGICAL SUPPLY — 54 items
BLADE DRUM FLTD (BLADE) ×3 IMPLANT
BLADE SAW 1 (BLADE) ×3 IMPLANT
BLADE SAW 1/2 (BLADE) ×2 IMPLANT
CANISTER SUCT 1200ML W/VALVE (MISCELLANEOUS) ×3 IMPLANT
CANISTER SUCT 3000ML (MISCELLANEOUS) ×6 IMPLANT
CAPT HIP TOTAL 2 ×2 IMPLANT
CARTRIDGE OIL MAESTRO DRILL (MISCELLANEOUS) ×1 IMPLANT
CATH FOL LEG HOLDER (MISCELLANEOUS) ×3 IMPLANT
CATH TRAY METER 16FR LF (MISCELLANEOUS) ×3 IMPLANT
DIFFUSER MAESTRO (MISCELLANEOUS) ×3 IMPLANT
DRAPE INCISE IOBAN 66X60 STRL (DRAPES) ×3 IMPLANT
DRAPE SHEET LG 3/4 BI-LAMINATE (DRAPES) ×3 IMPLANT
DRAPE TABLE BACK 80X90 (DRAPES) ×3 IMPLANT
DRSG DERMACEA 8X12 NADH (GAUZE/BANDAGES/DRESSINGS) ×3 IMPLANT
DRSG OPSITE POSTOP 3X4 (GAUZE/BANDAGES/DRESSINGS) ×3 IMPLANT
DRSG OPSITE POSTOP 4X12 (GAUZE/BANDAGES/DRESSINGS) ×3 IMPLANT
DRSG OPSITE POSTOP 4X14 (GAUZE/BANDAGES/DRESSINGS) ×3 IMPLANT
DRSG TEGADERM 4X4.75 (GAUZE/BANDAGES/DRESSINGS) ×3 IMPLANT
DURAPREP 26ML APPLICATOR (WOUND CARE) ×5 IMPLANT
ELECT BLADE 6.5 EXT (BLADE) ×3 IMPLANT
ELECT CAUTERY BLADE 6.4 (BLADE) ×3 IMPLANT
GLOVE BIOGEL M STRL SZ7.5 (GLOVE) ×3 IMPLANT
GLOVE INDICATOR 8.0 STRL GRN (GLOVE) ×3 IMPLANT
GLOVE SURG 9.0 ORTHO LTXF (GLOVE) ×3 IMPLANT
GLOVE SURG ORTHO 9.0 STRL STRW (GLOVE) ×3 IMPLANT
GOWN STRL REUS W/ TWL LRG LVL3 (GOWN DISPOSABLE) ×2 IMPLANT
GOWN STRL REUS W/TWL 2XL LVL3 (GOWN DISPOSABLE) ×3 IMPLANT
GOWN STRL REUS W/TWL LRG LVL3 (GOWN DISPOSABLE) ×6
HANDPIECE SUCTION TUBG SURGILV (MISCELLANEOUS) ×3 IMPLANT
HEMOVAC 400CC 10FR (MISCELLANEOUS) ×3 IMPLANT
HOOD PEEL AWAY FLYTE STAYCOOL (MISCELLANEOUS) ×6 IMPLANT
KIT RM TURNOVER STRD PROC AR (KITS) ×3 IMPLANT
NDL SAFETY 18GX1.5 (NEEDLE) ×3 IMPLANT
NS IRRIG 1000ML POUR BTL (IV SOLUTION) ×3 IMPLANT
OIL CARTRIDGE MAESTRO DRILL (MISCELLANEOUS) ×3
PACK HIP PROSTHESIS (MISCELLANEOUS) ×3 IMPLANT
PIN STEIN THRED 5/32 (Pin) ×3 IMPLANT
SOL .9 NS 3000ML IRR  AL (IV SOLUTION) ×2
SOL .9 NS 3000ML IRR AL (IV SOLUTION) ×1
SOL .9 NS 3000ML IRR UROMATIC (IV SOLUTION) ×1 IMPLANT
SOL PREP PVP 2OZ (MISCELLANEOUS) ×3
SOLUTION PREP PVP 2OZ (MISCELLANEOUS) ×1 IMPLANT
SPONGE DRAIN TRACH 4X4 STRL 2S (GAUZE/BANDAGES/DRESSINGS) ×3 IMPLANT
STAPLER SKIN PROX 35W (STAPLE) ×3 IMPLANT
SUT ETHIBOND #5 BRAIDED 30INL (SUTURE) ×3 IMPLANT
SUT TICRON 2-0 30IN 311381 (SUTURE) ×2 IMPLANT
SUT VIC AB 0 CT1 36 (SUTURE) ×3 IMPLANT
SUT VIC AB 1 CT1 36 (SUTURE) ×6 IMPLANT
SUT VIC AB 2-0 CT1 27 (SUTURE) ×3
SUT VIC AB 2-0 CT1 TAPERPNT 27 (SUTURE) ×1 IMPLANT
SYR 20CC LL (SYRINGE) ×3 IMPLANT
TAPE ADH 3 LX (MISCELLANEOUS) ×3 IMPLANT
TAPE TRANSPORE STRL 2 31045 (GAUZE/BANDAGES/DRESSINGS) ×3 IMPLANT
WATER STERILE IRR 1000ML POUR (IV SOLUTION) ×6 IMPLANT

## 2015-06-24 NOTE — Op Note (Signed)
OPERATIVE NOTE  DATE OF SURGERY:  06/24/2015  PATIENT NAME:  Nancy York   DOB: 1945-09-06  MRN: [id number]  PRE-OPERATIVE DIAGNOSIS: Degenerative arthrosis of the left hip, primary Retained hardware status post open reduction and internal fixation of a left intertrochanteric femur fracture  POST-OPERATIVE DIAGNOSIS:  Same  PROCEDURE:  Removal of retained hardware from the left hip and conversion to a left total hip arthroplasty  SURGEON:  Jena Gauss. M.D.  ASSISTANT:  Van Clines, PA (present and scrubbed throughout the case, critical for assistance with exposure, retraction, instrumentation, and closure)  ANESTHESIA: epidural and spinal  ESTIMATED BLOOD LOSS: 500 mL  FLUIDS REPLACED: 2200 mL of crystalloid  DRAINS: 2 medium drains to a Hemovac reservoir  IMPLANTS UTILIZED: DePuy 13.5 mm large stature AML femoral stem, 52 mm OD Pinnacle 100 acetabular component, neutral Pinnacle Altrx polyethylene insert, and a 38 mm M-SPEC -2 mm hip ball  INDICATIONS FOR SURGERY: Nancy York is a 70 y.o. year old female with a remote history of open reduction and internal fixation of a left intertrochanteric femur fracture. She has had progressive hip and groin  pain. X-rays demonstrated severe degenerative changes. The patient had not seen any significant improvement despite conservative nonsurgical intervention. After discussion of the risks and benefits of surgical intervention, the patient expressed understanding of the risks benefits and agree with plans for total hip arthroplasty.   The risks, benefits, and alternatives were discussed at length including but not limited to the risks of infection, bleeding, nerve injury, stiffness, blood clots, the need for revision surgery, limb length inequality, dislocation, cardiopulmonary complications, among others, and they were willing to proceed.  PROCEDURE IN DETAIL: The patient was brought into the operating room and, after adequate spinal  and general anesthesia was achieved, the patient was placed in a right lateral decubitus position. Axillary roll was placed and all bony prominences were well-padded. The patient's left hip was cleaned and prepped with alcohol and DuraPrep and draped in the usual sterile fashion. A "timeout" was performed as per usual protocol. A lateral curvilinear incision was made gently curving towards the posterior superior iliac spine. The IT band was incised in line with the skin incision and the fibers of the gluteus maximus were split in line. The fascia of the vastus lateralis was incised revealing a 4 hole dynamic hip compression plate secured to the femur with 4 screws. The screws were removed without difficulty. The large lag screw was broken and the lateral fragment was removed. Sideplate was removed without difficulty. The piriformis tendon was identified, skeletonized, and incised at its insertion to the proximal femur and reflected posteriorly. A T type posterior capsulotomy was performed. Prior to dislocation of the femoral head, a threaded Steinmann pin was inserted through a separate stab incision into the pelvis superior to the acetabulum and bent in the form of a stylus so as to assess limb length and hip offset throughout the procedure. The femoral head was then dislocated posteriorly. Inspection of the femoral head demonstrated severe degenerative changes with full-thickness loss of articular cartilage. The femoral neck cut was performed using an oscillating saw. The remaining medial fragment of the broken lag screw was removed by splitting the femoral head. The anterior capsule was elevated off of the femoral neck using a periosteal elevator. Attention was then directed to the acetabulum. The remnant of the labrum was excised using electrocautery. Inspection of the acetabulum also demonstrated significant degenerative changes. The acetabulum was reamed in sequential  fashion up to a 51 mm diameter. Good  punctate bleeding bone was encountered. A 52 mm Pinnacle 100 acetabular component was positioned and impacted into place. Good scratch fit was appreciated. A neutral polyethylene trial was inserted.  Attention was then directed to the proximal femur. A hole for reaming of the proximal femoral canal was created using a high-speed burr. The femoral canal was reamed in sequential fashion up to a 13.5 mm diameter. This allowed for approximately 6 cm of scratch fit. Serial broaches were inserted up to a 13.5 mm large stature femoral broach. Calcar region was planed and a trial reduction was performed using a 36 mm hip ball with a -2 mm neck length. Good equalization of limb lengths and hip offset was appreciated and excellent stability was noted both anteriorly and posteriorly. Trial components were removed. The acetabular shell was irrigated with copious amounts of normal saline with antibiotic solution and suctioned dry. A neutral Pinnacle Marathon polyethylene insert was positioned and impacted into place. Next, a 13.5 mm large stature AML femoral stem was positioned and impacted into place. Excellent scratch fit was appreciated. A trial reduction was again performed with a 36 mm hip ball with a -2 mm neck length. Again, good equalization of limb lengths was appreciated and excellent stability appreciated both anteriorly and posteriorly. The hip was then dislocated and the trial hip ball was removed. The Morse taper was cleaned and dried. A 36 mm M-SPEC hip ball with a -2 mm neck length was placed on the trunnion and impacted into place. The hip was then reduced and placed through range of motion. Excellent stability was appreciated both anteriorly and posteriorly.  The wound was irrigated with copious amounts of normal saline with antibiotic solution and suctioned dry. Good hemostasis was appreciated. The posterior capsulotomy was repaired using #5 Ethibond. Piriformis tendon was reapproximated to the  undersurface of the gluteus medius tendon using #5 Ethibond. Two medium drains were placed in the wound bed and brought out through separate stab incisions to be attached to a Hemovac reservoir. The IT band was reapproximated using interrupted sutures of #1 Vicryl. Subcutaneous tissue was approximated using first #0 Vicryl followed by #2-0 Vicryl. The skin was closed with skin staples.  The patient tolerated the procedure well and was transported to the recovery room in stable condition.   Jena Gauss., M.D.

## 2015-06-24 NOTE — Brief Op Note (Signed)
06/24/2015  4:28 PM  PATIENT:  Nancy York  70 y.o. female  PRE-OPERATIVE DIAGNOSIS:  Degenerative arthrosis of the left hip; retained hardware status post ORIF of left intertrochanteric femur fracture  POST-OPERATIVE DIAGNOSIS:  Same  PROCEDURE:  Procedure(s): TOTAL HIP ARTHROPLASTY (Left) HARDWARE REMOVAL/ DHS (Left)  SURGEON:  Surgeon(s) and Role:    * Donato Heinz, MD - Primary  ASSISTANTS: Van Clines, PA   ANESTHESIA:   spinal and general  EBL:  Total I/O In: 1000 [I.V.:1000] Out: 700 [Urine:200; Blood:500]  BLOOD ADMINISTERED:none  DRAINS: 2 medium Hemovac drains   LOCAL MEDICATIONS USED:  NONE  SPECIMEN:  Source of Specimen:  Left femoral head  DISPOSITION OF SPECIMEN:  PATHOLOGY  COUNTS:  YES  TOURNIQUET:  * No tourniquets in log *  DICTATION: .Dragon Dictation  PLAN OF CARE: Admit to inpatient   PATIENT DISPOSITION:  PACU - hemodynamically stable.   Delay start of Pharmacological VTE agent (>24hrs) due to surgical blood loss or risk of bleeding: yes

## 2015-06-24 NOTE — Transfer of Care (Signed)
Immediate Anesthesia Transfer of Care Note  Patient: Nancy York  Procedure(s) Performed: Procedure(s): TOTAL HIP ARTHROPLASTY (Left) HARDWARE REMOVAL/ DHS (Left)  Patient Location: PACU  Anesthesia Type:General  Level of Consciousness: sedated  Airway & Oxygen Therapy: Patient Spontanous Breathing and Patient connected to face mask oxygen  Post-op Assessment: Report given to RN and Post -op Vital signs reviewed and stable  Post vital signs: Reviewed and stable  Last Vitals:  Filed Vitals:   06/24/15 1048  BP: 155/76  Pulse: 86  Temp: 36.9 C  Resp: 16    Complications: No apparent anesthesia complications

## 2015-06-24 NOTE — Progress Notes (Signed)
PHARMACIST - PHYSICIAN ORDER COMMUNICATION  CONCERNING: P&T Medication Policy on Herbal Medications  DESCRIPTION:  This patient's order for:  Flax seed oil capsules, plant sterols, and inulin chews  has been noted.  This product(s) is classified as an "herbal" or natural product. Due to a lack of definitive safety studies or FDA approval, nonstandard manufacturing practices, plus the potential risk of unknown drug-drug interactions while on inpatient medications, the Pharmacy and Therapeutics Committee does not permit the use of "herbal" or natural products of this type within [facility].   ACTION TAKEN: The pharmacy department is unable to verify this order at this time and your patient has been informed of this safety policy. Please reevaluate patient's clinical condition at discharge and address if the herbal or natural product(s) should be resumed at that time.  Cindi Carbon, PharmD Clinical Pharmacist 7:18 PM 06/24/15

## 2015-06-24 NOTE — Anesthesia Preprocedure Evaluation (Addendum)
Anesthesia Evaluation  Patient identified by MRN, date of birth, ID band Patient awake    Reviewed: Allergy & Precautions, NPO status , Patient's Chart, lab work & pertinent test results, reviewed documented beta blocker date and time   Airway Mallampati: II  TM Distance: >3 FB     Dental  (+) Chipped, Partial Upper   Pulmonary           Cardiovascular hypertension, Pt. on medications and Pt. on home beta blockers      Neuro/Psych    GI/Hepatic GERD  ,  Endo/Other    Renal/GU      Musculoskeletal  (+) Arthritis ,   Abdominal   Peds  Hematology   Anesthesia Other Findings Hx of PVCs and q wavws in the past. New EKG does not show either of those.  Reproductive/Obstetrics                           Anesthesia Physical Anesthesia Plan  ASA: III  Anesthesia Plan: Spinal   Post-op Pain Management:    Induction:   Airway Management Planned:   Additional Equipment:   Intra-op Plan:   Post-operative Plan:   Informed Consent: I have reviewed the patients History and Physical, chart, labs and discussed the procedure including the risks, benefits and alternatives for the proposed anesthesia with the patient or authorized representative who has indicated his/her understanding and acceptance.     Plan Discussed with: CRNA  Anesthesia Plan Comments:         Anesthesia Quick Evaluation

## 2015-06-24 NOTE — H&P (Signed)
The patient has been re-examined, and the chart reviewed, and there have been no interval changes to the documented history and physical.    The risks, benefits, and alternatives have been discussed at length. The patient expressed understanding of the risks benefits and agreed with plans for surgical intervention.  James P. Hooten, Jr. M.D.    

## 2015-06-24 NOTE — Anesthesia Procedure Notes (Addendum)
Spinal Patient location during procedure: OR Staffing Anesthesiologist: Berdine Addison Performed by: anesthesiologist  Preanesthetic Checklist Completed: patient identified, site marked, surgical consent, pre-op evaluation, timeout performed, IV checked and risks and benefits discussed Spinal Block Patient position: sitting Prep: Betadine Patient monitoring: heart rate, cardiac monitor, continuous pulse ox and blood pressure Approach: midline Location: L3-4 Injection technique: single-shot Needle Needle type: Pencil-Tip  Needle gauge: 25 G Needle length: 9 cm Assessment Sensory level: T10 Additional Notes 1200 Marcaine 12.0 mmg  Date/Time: 06/24/2015 12:10 PM Performed by: Ginger Carne Pre-anesthesia Checklist: Patient identified, Emergency Drugs available, Suction available, Patient being monitored and Timeout performed Patient Re-evaluated:Patient Re-evaluated prior to inductionOxygen Delivery Method: Simple face mask    Procedure Name: LMA Insertion Date/Time: 06/24/2015 2:31 PM Performed by: Ginger Carne Oxygen Delivery Method: Circle system utilized LMA: LMA inserted LMA Size: 3.5 Number of attempts: 1 Placement Confirmation: positive ETCO2 Tube secured with: Tape Dental Injury: Teeth and Oropharynx as per pre-operative assessment

## 2015-06-25 ENCOUNTER — Encounter: Payer: Self-pay | Admitting: Orthopedic Surgery

## 2015-06-25 LAB — BASIC METABOLIC PANEL
Anion gap: 7 (ref 5–15)
BUN: 14 mg/dL (ref 6–20)
CALCIUM: 8.2 mg/dL — AB (ref 8.9–10.3)
CO2: 27 mmol/L (ref 22–32)
CREATININE: 0.64 mg/dL (ref 0.44–1.00)
Chloride: 103 mmol/L (ref 101–111)
GFR calc Af Amer: >60 mL/min (ref >60)
GFR calc non Af Amer: >60 mL/min (ref >60)
GLUCOSE: 115 mg/dL — AB (ref 65–99)
Potassium: 4.1 mmol/L (ref 3.5–5.1)
Sodium: 137 mmol/L (ref 135–145)

## 2015-06-25 LAB — CBC
HEMATOCRIT: 27 % — AB (ref 35.0–47.0)
Hemoglobin: 9.3 g/dL — ABNORMAL LOW (ref 12.0–16.0)
MCH: 29.4 pg (ref 26.0–34.0)
MCHC: 34.4 g/dL (ref 32.0–36.0)
MCV: 85.4 fL (ref 80.0–100.0)
Platelets: 216 10*3/uL (ref 150–440)
RBC: 3.16 MIL/uL — ABNORMAL LOW (ref 3.80–5.20)
RDW: 14.1 % (ref 11.5–14.5)
WBC: 8.6 10*3/uL (ref 3.6–11.0)

## 2015-06-25 MED ORDER — ENOXAPARIN SODIUM 40 MG/0.4ML ~~LOC~~ SOLN: 40.0000 mg | SUBCUTANEOUS | Status: DC

## 2015-06-25 MED ORDER — TRAMADOL HCL 50 MG PO TABS: 50.0000 mg | ORAL_TABLET | ORAL | Status: DC | PRN

## 2015-06-25 MED ORDER — OXYCODONE HCL 5 MG PO TABS: 5.0000 mg | ORAL_TABLET | ORAL | Status: DC | PRN

## 2015-06-25 NOTE — Evaluation (Signed)
Occupational Therapy Evaluation Patient Details Name: Nancy York MRN: [id number] DOB: [birth year] Today's Date: 06/25/2015    History of Present Illness Pt underwent L hip hardware removal and posterior approach THR without reported post-op complications. She had her right knee replaced a few years ago.   Clinical Impression   This patient is a 70 year old female who came to [facility] for a left total hip replacement (posterior approach).  Patient lives with her husband in a story home with 2 steps to enter with bilateral rails.  She had been independent with ADL and functional mobility. She now shows deficits with pain, mobility and activities of daily living and would benefit from Occupational Therapy for ADL/functional mobility training while .staying within hip precautions (posterior approach).      Follow Up Recommendations       Equipment Recommendations       Recommendations for Other Services       Precautions / Restrictions Precautions Precautions: Posterior Hip;Fall Precaution Booklet Issued: Yes (comment) (per PT) Restrictions Weight Bearing Restrictions: Yes LLE Weight Bearing: Weight bearing as tolerated      Mobility Bed Mobility  Transfers            Balance                                  ADL                                         General ADL Comments: Had been independent.  Today practiced techniques for lower body dressing using hip kit to stay within hip precautions (posterior approach). Patient named 2 of 3 precautions. Practiced Donned/doffed socks and pants to knees (drain still in place). Patient needed set up and minimal assist with verbal cues for technique and safety.     Vision     Perception     Praxis      Pertinent Vitals/Pain Pain Assessment: No/denies pain (at rest) Pain Score: 0-No pain Pain Location: L hip; denies pain at rest but increases with activity.      Hand Dominance Right   Extremity/Trunk Assessment Upper Extremity Assessment Upper Extremity Assessment: Overall WFL for tasks assessed   Lower Extremity Assessment Lower Extremity Assessment: Defer to PT evaluation        Communication Communication Communication: No difficulties   Cognition Arousal/Alertness: Awake/alert Behavior During Therapy: WFL for tasks assessed/performed Overall Cognitive Status: Within Functional Limits for tasks assessed                     General Comments       Exercises      Shoulder Instructions      Home Living Family/patient expects to be discharged to:: Private residence Living Arrangements: Spouse/significant other Available Help at Discharge: Family Type of Home: House Home Access: Stairs to enter [employer] of Steps: 2 Entrance Stairs-Rails: Right;Left Home Layout: Two level;Able to live on main level with bedroom/bathroom     Bathroom Shower/Tub: Tub/shower unit   Bathroom Toilet: Handicapped height     Home Equipment: Environmental consultant - 2 wheels;Bedside commode;Cane - single point          Prior Functioning/Environment Level of Independence: Independent        Comments: with out assistive devices  OT Diagnosis: Acute pain   OT Problem List:     OT Treatment/Interventions: Self-care/ADL training    OT Goals(Current goals can be found in the care plan section) Acute Rehab OT Goals Patient Stated Goal: to go home OT Goal Formulation: With patient Time For Goal Achievement: 07/09/15 Potential to Achieve Goals: Good  OT Frequency: Min 1X/week   Barriers to D/C:            Co-evaluation              End of Session Equipment Utilized During Treatment:  (hip kit)  Activity Tolerance:   Patient left: in chair;with call bell/phone within reach;with chair alarm set   Time: 4401-0272 OT Time Calculation (min): 24 min Charges:  OT General Charges $OT Visit: 1 Procedure OT  Evaluation $OT Eval Low Complexity: 1 Procedure OT Treatments $Self Care/Home Management : 8-22 mins G-Codes:    Myrene Galas, MS/OTR/L  06/25/2015, 11:33 AM

## 2015-06-25 NOTE — Progress Notes (Signed)
Patient tolerating po intake well- IV fluids stop per protocol.

## 2015-06-25 NOTE — Anesthesia Postprocedure Evaluation (Signed)
Anesthesia Post Note  Patient: Nancy York  Procedure(s) Performed: Procedure(s) (LRB): TOTAL HIP ARTHROPLASTY (Left) HARDWARE REMOVAL/ DHS (Left)  Patient location during evaluation: Other Anesthesia Type: Spinal Level of consciousness: oriented and awake and alert Pain management: pain level controlled Vital Signs Assessment: post-procedure vital signs reviewed and stable Respiratory status: spontaneous breathing, respiratory function stable and patient connected to nasal cannula oxygen Cardiovascular status: blood pressure returned to baseline and stable Postop Assessment: no headache and no backache Anesthetic complications: no    Last Vitals:  Filed Vitals:   06/24/15 2339 06/25/15 0347  BP: 138/50 142/52  Pulse: 83 86  Temp: 36.5 C 36.8 C  Resp: 18 18    Last Pain:  Filed Vitals:   06/25/15 0742  PainSc: 0-No pain                 Starling Manns

## 2015-06-25 NOTE — Care Management Note (Addendum)
Case Management Note  Patient Details  Name: Nancy York MRN: [id number] Date of Birth: 1946/03/22  Subjective/Objective:                  Met with patient to discuss discharge planning. She plans to return home with her husband and would like to use [facility]. She has a rolling walker and denies any other DME need. She uses [facility] for Rx  [phone].  Action/Plan: List of home health agencies provided. Referral sent to [facility]. Lovenox 9m #14 called in to [facility] for price/authorization if needed. RNCM will continue to follow.   Expected Discharge Date:  06/27/15               Expected Discharge Plan:     In-House Referral:     Discharge planning Services  CM Consult  Post Acute Care Choice:  Home Health Choice offered to:  Patient  DME Arranged:    DME Agency:     HH Arranged:  PT [facility] Status of Service:  In process, will continue to follow  Medicare Important Message Given:    Date Medicare IM Given:    Medicare IM give by:    Date Additional Medicare IM Given:    Additional Medicare Important Message give by:     If discussed at [place] Meet[place], dates discussed:    Additional Comments: Lovenox $111.77.   AMarshell Garfinkel RN 06/25/2015, 9:44 AM

## 2015-06-25 NOTE — Progress Notes (Signed)
Physical Therapy Treatment Patient Details Name: Nancy York MRN: [id number] DOB: [birth year] Today's Date: 06/25/2015    History of Present Illness Pt underwent L hip hardware removal and posterior approach THR without reported post-op complications. PT evaluation performed on POD#1. No reported falls in the last 12 months    PT Comments    Pt demonstrating excellent progress with physical therapy. She transfers with increased ease and stability. Pt able to recall 3/3 hip precautions upon questioning. Pt able to increase ambulation distance and performs full lap around RN station. She is able to progress to partial step-through pattern. Pt completes all seated and supine exercises as instructed with improving L hip flexion strength. Will continue to progress ambulation distance and attempt stair training tomorrow. May be appropriate to discharge POD#2 depending on medical goals and input from MD regarding status. Pt will benefit from skilled PT services to address deficits in strength, balance, and mobility in order to return to full function at home.    Follow Up Recommendations  Home health PT     Equipment Recommendations  None recommended by PT    Recommendations for Other Services       Precautions / Restrictions Precautions Precautions: Posterior Hip;Fall Precaution Booklet Issued: Yes (comment) (per PT) Restrictions Weight Bearing Restrictions: Yes LLE Weight Bearing: Weight bearing as tolerated    Mobility  Bed Mobility Overal bed mobility: Needs Assistance Bed Mobility: Sit to Supine       Sit to supine: Min assist   General bed mobility comments: Pt requires minA+1 to provide assist for LLE when returning to bed. Otherwise pt demonstrates good L hip flexion  Transfers Overall transfer level: Needs assistance Equipment used: Rolling walker (2 wheeled) Transfers: Sit to/from Stand Sit to Stand: Min guard         General transfer comment: Pt demonstrates  improving transfers with good hand placement and increased weight shift to LLE. Stable in standing with UE support  Ambulation/Gait Ambulation/Gait assistance: Min guard Ambulation Distance (Feet): 220 Feet Assistive device: Rolling walker (2 wheeled) Gait Pattern/deviations: Step-to pattern Gait velocity: Decreased but functional for household mobility   General Gait Details: Pt demonstrates step-to gait pattern but with cues is able to increase her step length to partial reciprocal pattern. Pt provided cues for decreased UE support and improved upright posture. Pt able to complete full lap around RN station. Denies DOE   Information systems manager Rankin (Stroke Patients Only)       Balance Overall balance assessment: Needs assistance Sitting-balance support: No upper extremity supported Sitting balance-Leahy Scale: Good     Standing balance support: No upper extremity supported Standing balance-Leahy Scale: Fair                      Cognition Arousal/Alertness: Awake/alert Behavior During Therapy: WFL for tasks assessed/performed Overall Cognitive Status: Within Functional Limits for tasks assessed                      Exercises Total Joint Exercises Ankle Circles/Pumps: Strengthening;Both;10 reps;Supine Quad Sets: Strengthening;Both;10 reps;Supine Towel Squeeze: Strengthening;Both;10 reps;Seated Hip ABduction/ADduction: Strengthening;10 reps;Seated;Both Long Arc Quad: Strengthening;Left;10 reps;Seated Knee Flexion: Strengthening;Left;10 reps;Seated    General Comments        Pertinent Vitals/Pain Pain Assessment: No/denies pain Pain Score: 0-No pain Pain Location: Reports some L hip "soreness" but denies pain at rest. Pain increases with ambulation  to 8/10. Pain Intervention(s): Monitored during session    Home Living Family/patient expects to be discharged to:: Private residence Living Arrangements:  Spouse/significant other Available Help at Discharge: Family Type of Home: House Home Access: Stairs to enter Entrance Stairs-Rails: Right;Left Home Layout: Two level;Able to live on main level with bedroom/bathroom Home Equipment: Dan Humphreys - 2 wheels;Bedside commode;Cane - single point      Prior Function Level of Independence: Independent      Comments: with out assistive devices   PT Goals (current goals can now be found in the care plan section) Acute Rehab PT Goals Patient Stated Goal: to go home PT Goal Formulation: With patient Time For Goal Achievement: 07/09/15 Potential to Achieve Goals: Good Progress towards PT goals: Progressing toward goals    Frequency  BID    PT Plan Current plan remains appropriate    Co-evaluation             End of Session Equipment Utilized During Treatment: Gait belt Activity Tolerance: Patient tolerated treatment well Patient left: with SCD's reapplied;in bed;with bed alarm set;with call bell/phone within reach (Abduction pillow between legs)     Time: 1610-9604 PT Time Calculation (min) (ACUTE ONLY): 28 min  Charges:  $Gait Training: 8-22 mins $Therapeutic Exercise: 8-22 mins                    G Codes:      Sharalyn Ink Huprich PT, DPT   Huprich,Jason 06/25/2015, 2:10 PM

## 2015-06-25 NOTE — Progress Notes (Signed)
Patient's up and ambulating in hallway with PT.  

## 2015-06-25 NOTE — Discharge Instructions (Signed)

## 2015-06-25 NOTE — Evaluation (Signed)
Physical Therapy Evaluation Patient Details Name: Nancy York MRN: [id number] DOB: 07/15/1945 Today's Date: 06/25/2015   History of Present Illness  Pt underwent L hip hardware removal and posterior approach THR without reported post-op complications. PT evaluation performed on POD#1. No reported falls in the last 12 months  Clinical Impression  Pt requires minA for bed mobility but CGA only for transfers and limited ambulation. She is initially dizzy upon sitting and standing. Orthostatic vitals positive for hypotension with change from sitting to standing. Pt able to ambulate to door and back to recliner with minimal increase in pain. Will progress ambulation distance this afternoon toward goal of discharge home with [facility] PT. Pt will benefit from skilled PT services to address deficits in strength, balance, and mobility in order to return to full function at home.     Follow Up Recommendations Home health PT    Equipment Recommendations  None recommended by PT    Recommendations for Other Services       Precautions / Restrictions Precautions Precautions: Posterior Hip;Fall Precaution Booklet Issued: Yes (comment) Restrictions Weight Bearing Restrictions: Yes LLE Weight Bearing: Weight bearing as tolerated      Mobility  Bed Mobility Overal bed mobility: Needs Assistance Bed Mobility: Supine to Sit     Supine to sit: Min assist     General bed mobility comments: Pt requires RUE assist on bedrail. LUE assist from therapist for trunk righting. Cues when exiting bed to R to avoid hip precautions. Pt demonstrates good LLE adduction strength. Able to scoot toward EOB with bedrail but no assist required by therapist  Transfers Overall transfer level: Needs assistance Equipment used: Rolling walker (2 wheeled) Transfers: Sit to/from Stand Sit to Stand: Min guard         General transfer comment: Pt demonstrates safe hand palcement and good speed and sequencing with  transfers. Decreased weight shift to LLE. Mild increase in soreness with transfer. While sitting and with tranfers pt reports feeling a little "whoozy." Orthostatic vitals obtained which were positive for orthostatic hypotension (see vital signs section). RN notified  Ambulation/Gait Ambulation/Gait assistance: Min guard Ambulation Distance (Feet): 30 Feet Assistive device: Rolling walker (2 wheeled) Gait Pattern/deviations: Step-to pattern;Decreased step length - right;Decreased stance time - left;Decreased weight shift to left;Antalgic Gait velocity: Decreased Gait velocity interpretation: <1.8 ft/sec, indicative of risk for recurrent falls General Gait Details: Pt provided cues for proper sequencing with rolling walker. Pt demonstrates good safety and stability with assistive device. Heavy use of UE for support. Pt reports mild increase in "soreness" with ambulation. Dizziness decreases with ambulation  Stairs            Wheelchair Mobility    Modified Rankin (Stroke Patients Only)       Balance Overall balance assessment: Needs assistance Sitting-balance support: No upper extremity supported Sitting balance-Leahy Scale: Good     Standing balance support: No upper extremity supported Standing balance-Leahy Scale: Fair                               Pertinent Vitals/Pain Pain Assessment: 0-10 Pain Score: 0-No pain Pain Location: L hip; denies pain at rest but increases with activity. Reports "soreness" but denies pain Pain Intervention(s): Limited activity within patient's tolerance;Monitored during session;Premedicated before session;Ice applied    Home Living Family/patient expects to be discharged to:: Private residence Living Arrangements: Spouse/significant other Available Help at Discharge: Family Type of Home: House Home Access:  Stairs to enter Entrance Stairs-Rails: Right;Left (Bilateral iron posts to hold onto) Secretary/administrator of Steps:  2 Home Layout: Two level;Able to live on main level with bedroom/bathroom Home Equipment: Dan Humphreys - 2 wheels;Bedside commode;Cane - single point (no grab bars, no wheelchair, no hosp bed, no shower chair)      Prior Function Level of Independence: Independent         Comments: Indpendent full community ambulator without assistive device. Independent with ADL/IADLs     Hand Dominance   Dominant Hand: Right    Extremity/Trunk Assessment   Upper Extremity Assessment: Overall WFL for tasks assessed           Lower Extremity Assessment: LLE deficits/detail   LLE Deficits / Details: RLE grossly WFL. LLE: intact light touch sensation. Full DF/PF. SLR and SAQ without assistance     Communication   Communication: No difficulties  Cognition Arousal/Alertness: Awake/alert Behavior During Therapy: WFL for tasks assessed/performed Overall Cognitive Status: Within Functional Limits for tasks assessed                      General Comments      Exercises Total Joint Exercises Ankle Circles/Pumps: Strengthening;Both;10 reps;Supine Quad Sets: Strengthening;Both;10 reps;Supine Gluteal Sets: Strengthening;Both;10 reps;Supine Short Arc Quad: Strengthening;10 reps;Supine;Left Heel Slides: Strengthening;Left;10 reps;Supine Hip ABduction/ADduction: Strengthening;10 reps;Left;Supine Straight Leg Raises: Strengthening;Left;10 reps;Supine      Assessment/Plan    PT Assessment Patient needs continued PT services  PT Diagnosis Difficulty walking;Abnormality of gait;Generalized weakness;Acute pain   PT Problem List Decreased strength;Decreased range of motion;Decreased balance;Decreased mobility;Decreased knowledge of use of DME;Decreased knowledge of precautions;Pain;Obesity  PT Treatment Interventions DME instruction;Stair training;Gait training;Therapeutic activities;Therapeutic exercise;Balance training;Neuromuscular re-education;Patient/family education;Manual techniques    PT Goals (Current goals can be found in the Care Plan section) Acute Rehab PT Goals Patient Stated Goal: Improve function at home with less pain PT Goal Formulation: With patient Time For Goal Achievement: 07/09/15 Potential to Achieve Goals: Good    Frequency BID   Barriers to discharge   3 steps to enter home    Co-evaluation               End of Session Equipment Utilized During Treatment: Gait belt Activity Tolerance: Patient tolerated treatment well Patient left: with call bell/phone within reach;in chair;with chair alarm set;with nursing/sitter in room;with SCD's reapplied (Ice pack on hip, abduction pillow between legs) Nurse Communication: Mobility status;Other (comment) (Orthostasis)         Time: 1610-9604 PT Time Calculation (min) (ACUTE ONLY): 40 min   Charges:   PT Evaluation $PT Eval Low Complexity: 1 Procedure PT Treatments $Therapeutic Exercise: 8-22 mins   PT G Codes:       Sharalyn Ink Huprich PT, DPT   Huprich,Jason 06/25/2015, 9:52 AM

## 2015-06-25 NOTE — Progress Notes (Signed)
Clinical Social Worker (CSW) received SNF consult. PT is recommending home health. RN Case Manager is aware of above. Please reconsult if future social work needs arise. CSW signing off.   Ladina Shutters Morgan, LCSW [phone] 

## 2015-06-25 NOTE — Progress Notes (Signed)
   Subjective: 1 Day Post-Op Procedure(s) (LRB): TOTAL HIP ARTHROPLASTY (Left) HARDWARE REMOVAL/ DHS (Left) Patient reports pain as mild.   Patient is well, and has had no acute complaints or problems We will start therapy today.  Plan is to go Home after hospital stay. no nausea or vomiting Patient denies any chest pains or shortness of breath. Slept well during the night  Objective: Vital signs in last 24 hours: Temp:  [96.2 F (35.7 C)-98.8 F (37.1 C)] 98.3 F (36.8 C) (03/02 0347) Pulse Rate:  [73-86] 86 (03/02 0347) Resp:  [10-18] 18 (03/02 0347) BP: (118-155)/(50-97) 142/52 mmHg (03/02 0347) SpO2:  [95 %-100 %] 98 % (03/02 0347) Weight:  [83.462 kg (184 lb)] 83.462 kg (184 lb) (03/01 1048) well approximated incision Heels are non tender and elevated off the bed using rolled towels Intake/Output from previous day: 03/01 0701 - 03/02 0700 In: 4391.7 [P.O.:720; I.V.:3371.7; IV Piggyback:300] Out: 1305 [Urine:675; Drains:130; Blood:500] Intake/Output this shift:     Recent Labs  06/25/15 0613  HGB 9.3*    Recent Labs  06/25/15 0613  WBC 8.6  RBC 3.16*  HCT 27.0*  PLT 216    Recent Labs  06/25/15 0613  NA 137  K 4.1  CL 103  CO2 27  BUN 14  CREATININE 0.64  GLUCOSE 115*  CALCIUM 8.2*   No results for input(s): LABPT, INR in the last 72 hours.  EXAM General - Patient is Alert, Appropriate and Oriented Extremity - Neurologically intact Neurovascular intact Sensation intact distally Intact pulses distally Dressing - dressing C/D/I Motor Function - intact, moving foot and toes well on exam.    Past Medical History  Diagnosis Date  . Hypertension   . GERD (gastroesophageal reflux disease)   . Diverticulosis   . Heart murmur     no doctor follows her for this  . Arthritis     joints, shoulder and knees    Assessment/Plan: 1 Day Post-Op Procedure(s) (LRB): TOTAL HIP ARTHROPLASTY (Left) HARDWARE REMOVAL/ DHS (Left) Active Problems:  S/P total hip arthroplasty  Estimated body mass index is 33.65 kg/(m^2) as calculated from the following:   Height as of this encounter:  (1.575 m).   Weight as of this encounter: 83.462 kg (184 lb). Advance diet Up with therapy D/C IV fluids Plan for discharge tomorrow Discharge home with home health  Labs: reviewed DVT Prophylaxis - Lovenox, Foot Pumps and TED hose Weight-Bearing as tolerated to left leg D/C O2 and Pulse OX and try on Room Air Begin working on a bowel movement Labs in am  [employer]. [facility] PA [facility] 06/25/2015, 7:03 AM

## 2015-06-26 LAB — SURGICAL PATHOLOGY

## 2015-06-26 LAB — BASIC METABOLIC PANEL
Anion gap: 7 (ref 5–15)
BUN: 10 mg/dL (ref 6–20)
CO2: 27 mmol/L (ref 22–32)
CREATININE: 0.62 mg/dL (ref 0.44–1.00)
Calcium: 8.2 mg/dL — ABNORMAL LOW (ref 8.9–10.3)
Chloride: 107 mmol/L (ref 101–111)
Glucose, Bld: 119 mg/dL — ABNORMAL HIGH (ref 65–99)
Potassium: 3.9 mmol/L (ref 3.5–5.1)
SODIUM: 141 mmol/L (ref 135–145)

## 2015-06-26 LAB — CBC
HCT: 24.9 % — ABNORMAL LOW (ref 35.0–47.0)
Hemoglobin: 8.6 g/dL — ABNORMAL LOW (ref 12.0–16.0)
MCH: 29.2 pg (ref 26.0–34.0)
MCHC: 34.3 g/dL (ref 32.0–36.0)
MCV: 85.2 fL (ref 80.0–100.0)
PLATELETS: 197 10*3/uL (ref 150–440)
RBC: 2.93 MIL/uL — AB (ref 3.80–5.20)
RDW: 14.1 % (ref 11.5–14.5)
WBC: 8.3 10*3/uL (ref 3.6–11.0)

## 2015-06-26 NOTE — Discharge Summary (Signed)
Physician Discharge Summary  Patient ID: Nancy York MRN: [id number] DOB/AGE: June 16, 1945 70 y.o.  Admit date: 06/24/2015 Discharge date: 06/26/2015  Admission Diagnoses:  CHRONIC HIP PAIN   Discharge Diagnoses: Patient Active Problem List   Diagnosis Date Noted  . 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 OA (osteoarthritis) of knee 08/24/2014    Past Medical History  Diagnosis Date  . Hypertension   . GERD (gastroesophageal reflux disease)   . Diverticulosis   . Heart murmur     no doctor follows her for this  . Arthritis     joints, shoulder and knees     Transfusion: No transfusions given doing this admission   Consultants (if any):   case management for home health assistance  Discharged Condition: Improved  Hospital Course: Nancy York is an 70 y.o. female who was admitted 06/24/2015 with a diagnosis of degenerative arthrosis left hip and went to the operating room on 06/24/2015 and underwent the above named procedures.    Surgeries:Procedure(s): TOTAL HIP ARTHROPLASTY HARDWARE REMOVAL/ [facility] on 06/24/2015  PRE-OPERATIVE DIAGNOSIS: Degenerative arthrosis of the left hip, primary Retained hardware status post open reduction and internal fixation of a left intertrochanteric femur fracture  POST-OPERATIVE DIAGNOSIS: Same  PROCEDURE: Removal of retained hardware from the left hip and conversion to a left total hip arthroplasty  SURGEON: Jena Gauss. M.D.  ASSISTANT: Van Clines, PA (present and scrubbed throughout the case, critical for assistance with exposure, retraction, instrumentation, and closure)  ANESTHESIA: epidural and spinal  ESTIMATED BLOOD LOSS: 500 mL  FLUIDS REPLACED: 2200 mL of crystalloid  DRAINS: 2 medium drains to a Hemovac reservoir  IMPLANTS UTILIZED: DePuy 13.5 mm large stature AML femoral stem, 52 mm OD Pinnacle 100 acetabular component, neutral Pinnacle Altrx polyethylene insert, and  a 38 mm M-SPEC -2 mm hip ball  INDICATIONS FOR SURGERY: MANDEE York is a 70 y.o. year old female with a remote history of open reduction and internal fixation of a left intertrochanteric femur fracture. She has had progressive hip and groin pain. X-rays demonstrated severe degenerative changes. The patient had not seen any significant improvement despite conservative nonsurgical intervention. After discussion of the risks and benefits of surgical intervention, the patient expressed understanding of the risks benefits and agree with plans for total hip arthroplasty.   The risks, benefits, and alternatives were discussed at length including but not limited to the risks of infection, bleeding, nerve injury, stiffness, blood clots, the need for revision surgery, limb length inequality, dislocation, cardiopulmonary complications, among others, and they were willing to proceed. for revision surgery, cardiopulmonary complications, among others, and they were willing to proceed. Patient tolerated the surgery well. No complications .Patient was taken to PACU where she was stabilized and then transferred to the orthopedic floor.  Patient started on Lovenox 30 q 12 hrs. Foot pumps applied bilaterally at 80 mm hg. Heels elevated off bed with rolled towels. No evidence of DVT. Calves non tender. Negative Homan. Physical therapy started on day #1 for gait training and transfer with OT starting on  day #1 for ADL and assisted devices. Patient has done well with therapy. Ambulated greater than 200 feet upon being discharged. Was able to go up and down 4 steps safely and independently  Patient's IV and Foley were discontinued on day #1 with the Hemovac being discharged on day #2. Dressing was also changed prior to patient being discharged.   She was given perioperative  antibiotics:  Anti-infectives    Start     Dose/Rate Route Frequency Ordered Stop   06/24/15 1745  clindamycin (CLEOCIN) IVPB 600 mg     600  mg 100 mL/hr over 30 Minutes Intravenous Every 6 hours 06/24/15 1736 06/25/15 1117   06/24/15 1014  clindamycin (CLEOCIN) 900 MG/50ML IVPB    Comments:  Buck MamSanchez, Becky Sue: cabinet override      06/24/15 1014 06/24/15 2214   06/24/15 0230  vancomycin (VANCOCIN) IVPB 1000 mg/200 mL premix     1,000 mg 200 mL/hr over 60 Minutes Intravenous  Once 06/24/15 0221 06/24/15 1211   06/24/15 0230  clindamycin (CLEOCIN) IVPB 900 mg     900 mg 100 mL/hr over 30 Minutes Intravenous  Once 06/24/15 0221 06/24/15 1216    .  She was fitted with AV 1 compression foot pump devices, instructed on heel pumps, early ambulation, and TED stockings bilaterally for DVT prophylaxis.  She benefited maximally from the hospital stay and there were no complications.    Recent vital signs:  Filed Vitals:   06/25/15 2113 06/26/15 0328  BP: 149/51 127/58  Pulse: 105 98  Temp: 99 F (37.2 C) 99.8 F (37.7 C)  Resp: 18 17    Recent laboratory studies:  Lab Results  Component Value Date   HGB 8.6* 06/26/2015   HGB 9.3* 06/25/2015   HGB 13.4 06/18/2015   Lab Results  Component Value Date   WBC 8.3 06/26/2015   PLT 197 06/26/2015   Lab Results  Component Value Date   INR 0.99 06/18/2015   Lab Results  Component Value Date   NA 137 06/25/2015   K 4.1 06/25/2015   CL 103 06/25/2015   CO2 27 06/25/2015   BUN 14 06/25/2015   CREATININE 0.64 06/25/2015   GLUCOSE 115* 06/25/2015    Discharge Medications:     Medication List    TAKE these medications        aspirin EC 81 MG tablet  Take 81 mg by mouth daily.     calcium citrate-vitamin D 315-200 MG-UNIT tablet  Commonly known as:  CITRACAL+D  Take 1 tablet by mouth 3 (three) times daily.     cetirizine 10 MG tablet  Commonly known as:  ZYRTEC  Take 10 mg by mouth daily. Reported on 06/24/2015     CHOLESTOFF PO  Take 2 capsules by mouth 2 (two) times daily.     enoxaparin 40 MG/0.4ML injection  Commonly known as:  LOVENOX  Inject 0.4 mLs  (40 mg total) into the skin daily.     FIBER CHOICE FRUITY BITES PO  Take 1 Dose by mouth once.     Flax Seed Oil 1000 MG Caps  Take 1 capsule by mouth 3 (three) times daily.     Ginkgo Biloba 120 MG Caps  Take 120 mg by mouth daily.     hydrochlorothiazide 25 MG tablet  Commonly known as:  HYDRODIURIL  Take 25 mg by mouth daily.     loratadine 10 MG tablet  Commonly known as:  CLARITIN  Take 10 mg by mouth daily.     meclizine 25 MG tablet  Commonly known as:  ANTIVERT  Take 25 mg by mouth 3 (three) times daily as needed for dizziness.     meloxicam 7.5 MG tablet  Commonly known as:  MOBIC  Take 7.5 mg by mouth 2 (two) times daily.     metoprolol succinate 25 MG 24 hr tablet  Commonly  known as:  TOPROL-XL  Take 25 mg by mouth daily.     OSTEO BI-FLEX TRIPLE STRENGTH PO  Take 1,200 mg by mouth 2 (two) times daily.     oxyCODONE 5 MG immediate release tablet  Commonly known as:  Oxy IR/ROXICODONE  Take 1-2 tablets (5-10 mg total) by mouth every 4 (four) hours as needed for severe pain.     pantoprazole 40 MG tablet  Commonly known as:  PROTONIX  Take 40 mg by mouth daily.     traMADol 50 MG tablet  Commonly known as:  ULTRAM  Take 1-2 tablets (50-100 mg total) by mouth every 4 (four) hours as needed for moderate pain.     vitamin B-12 500 MCG tablet  Commonly known as:  CYANOCOBALAMIN  Take 1,000 mcg by mouth daily.        Diagnostic Studies: Dg Hip Unilat W Or W/o Pelvis 2-3 Views Left  06/24/2015  CLINICAL DATA:  70 year old female for postoperative evaluation of hardware placement in the left hip. EXAM: DG HIP (WITH OR WITHOUT PELVIS) 2-3V LEFT COMPARISON:  No priors. FINDINGS: Postoperative changes of left hip arthroplasty noted. The femoral and acetabular components of the prosthesis appear well seated, without definite periprosthetic fracture. Femoral head is located in the prosthetic acetabulum. There are tracts horizontally oriented in the proximal femoral  diaphysis, presumably related to removed hardware. Gas is present within the joint space and in the overlying soft tissues lateral to the joint space, and there is a surgical drain in place with overlying skin staples. IMPRESSION: 1. Expected postoperative appearance of the left hip following left hip arthroplasty, as above. Electronically Signed   By: Trudie Reed M.D.   On: 06/24/2015 17:40    Disposition: 01-Home or Self Care      Discharge Instructions    Diet - low sodium heart healthy    Complete by:  As directed      Increase activity slowly    Complete by:  As directed            Follow-up Information    Follow up with Donato Heinz, MD On 08/06/2015.   Specialty:  Orthopedic Surgery   Why:  at 2:15pm   Contact information:   [address] [facility] [place] [address] [phone]        Signed: Tera Partridge. 06/26/2015, 7:16 AM

## 2015-06-26 NOTE — Progress Notes (Signed)
Physical Therapy Treatment Patient Details Name: Nancy York MRN: [id number] DOB: 1945/11/02 Today's Date: 06/26/2015    History of Present Illness Pt underwent L hip hardware removal and posterior approach THR without reported post-op complications. PT evaluation performed on POD#1. No reported falls in the last 12 months    PT Comments    Pt demonstrates excellent strength and stability with transfers and ambulation. She is able to complete stair training without evidence for safety concerns or instability. Pt is again able to complete a full lap around RN station again with improving step length and overall speed. Pt has met all PT barriers to discharge and is safe to discharge home when medically appropriate. Pt educated about hip precautions again including safety with car transfers. Issued additional HEP for standing exercises. Pt will benefit from skilled PT services to address deficits in strength, balance, and mobility in order to return to full function at home.    Follow Up Recommendations  Home health PT     Equipment Recommendations  None recommended by PT    Recommendations for Other Services       Precautions / Restrictions Precautions Precautions: Posterior Hip;Fall Precaution Booklet Issued: Yes (comment) (per PT) Restrictions Weight Bearing Restrictions: Yes LLE Weight Bearing: Weight bearing as tolerated    Mobility  Bed Mobility               General bed mobility comments: Received and left in recliner  Transfers Overall transfer level: Needs assistance Equipment used: Rolling walker (2 wheeled) Transfers: Sit to/from Stand Sit to Stand: Supervision         General transfer comment: Pt demonstrates good strength and safe sequencign with sit to stand transfer to/from recliner as well as BSC. Good weight acceptance to LLE. No signs for instability. Spent additional time educating patient   Ambulation/Gait Ambulation/Gait assistance: Min  guard Ambulation Distance (Feet): 275 Feet Assistive device: Rolling walker (2 wheeled) Gait Pattern/deviations: Step-through pattern Gait velocity: Decreased but functional for household mobility   General Gait Details: Pt able to progress to step-through pattern with cues. Cues provided for decreased UE reliance and 5-6 steps practiced with R HHA and no walker to assess WB through LLE. Pt with good weight acceptance and stability with L SLS. Pt denies DOE and no signs of respiratory distress. Gait speed improves throughout distance and overall is improving since yesterday   Stairs Stairs: Yes Stairs assistance: Min guard Stair Management: Two rails Number of Stairs: 4 General stair comments: Pt educated on proper sequencing on stairs. Ascend/descend 4 stairs with bilateral handrails. Pt with step-to pattern demonstrating excellent sequencing and stability. Repeated stair training with L unilateral railing to simulate home conditions. Pt again able to ascend/descend safely without concerns for imbalance.  Wheelchair Mobility    Modified Rankin (Stroke Patients Only)       Balance Overall balance assessment: Needs assistance Sitting-balance support: No upper extremity supported Sitting balance-Leahy Scale: Good     Standing balance support: No upper extremity supported Standing balance-Leahy Scale: Fair                      Cognition Arousal/Alertness: Awake/alert Behavior During Therapy: WFL for tasks assessed/performed Overall Cognitive Status: Within Functional Limits for tasks assessed                      Exercises Total Joint Exercises Towel Squeeze: Strengthening;Both;Seated;15 reps Heel Slides: Strengthening;15 reps;Seated;Left Hip ABduction/ADduction: Strengthening;Seated;Both;15 reps Long Arc  Quad: Strengthening;Seated;Left;15 reps Knee Flexion: Strengthening;Left;Seated;15 reps Other Exercises Other Exercises: Standing marches x 10, standing  mini squats x 10, standing SLR x 10, standing hip abduction x 10, standing heel raises x 10;    General Comments        Pertinent Vitals/Pain Pain Assessment: No/denies pain Pain Score: 0-No pain Pain Location: Pt reports increase in L hip pain to 4/10 with ambulation. Immediately diminishes once back in recliner Pain Intervention(s): Monitored during session;Premedicated before session;Limited activity within patient's tolerance    Home Living                      Prior Function            PT Goals (current goals can now be found in the care plan section) Acute Rehab PT Goals Patient Stated Goal: to go home PT Goal Formulation: With patient Time For Goal Achievement: 07/09/15 Potential to Achieve Goals: Good Progress towards PT goals: Progressing toward goals    Frequency  BID    PT Plan Current plan remains appropriate    Co-evaluation             End of Session Equipment Utilized During Treatment: Gait belt Activity Tolerance: Patient tolerated treatment well Patient left: in chair;with family/visitor present (with family awaiting RN to discharge. )     Time: 1962-2297 PT Time Calculation (min) (ACUTE ONLY): 38 min  Charges:  $Gait Training: 8-22 mins $Therapeutic Exercise: 8-22 mins $Therapeutic Activity: 8-22 mins                    G Codes:      Lyndel Safe Huprich PT, DPT   Huprich,Jason 06/26/2015, 10:50 AM

## 2015-06-26 NOTE — Progress Notes (Signed)
[facility] Family here to take her home. Belongings packed, IV's removed. VSS at time of discharge

## 2015-06-26 NOTE — Progress Notes (Signed)
   Subjective: 2 Days Post-Op Procedure(s) (LRB): TOTAL HIP ARTHROPLASTY (Left) HARDWARE REMOVAL/ [facility] (Left) Patient reports pain as 0 on 0-10 scale.   Patient is well, and has had no acute complaints or problems Continue with physical therapy today.  Plan is to go Home after hospital stay. no nausea and no vomiting Patient denies any chest pains or shortness of breath. Objective: Vital signs in last 24 hours: Temp:  [97.6 F (36.4 C)-99.8 F (37.7 C)] 99.8 F (37.7 C) (03/03 0328) Pulse Rate:  [83-105] 98 (03/03 0328) Resp:  [17-18] 17 (03/03 0328) BP: (124-149)/(50-58) 127/58 mmHg (03/03 0328) SpO2:  [94 %-99 %] 96 % (03/03 0328) well approximated incision Heels are non tender and elevated off the bed using rolled towels Intake/Output from previous day: 03/02 0701 - 03/03 0700 In: 740 [I.V.:740] Out: 956 [Urine:925; Drains:30; Stool:1] Intake/Output this shift:     Recent Labs  06/25/15 0613 06/26/15 0557  HGB 9.3* 8.6*    Recent Labs  06/25/15 0613 06/26/15 0557  WBC 8.6 8.3  RBC 3.16* 2.93*  HCT 27.0* 24.9*  PLT 216 197    Recent Labs  06/25/15 0613  NA 137  K 4.1  CL 103  CO2 27  BUN 14  CREATININE 0.64  GLUCOSE 115*  CALCIUM 8.2*   No results for input(s): LABPT, INR in the last 72 hours.  EXAM General - Patient is Alert, Appropriate and Oriented Extremity - Neurologically intact Neurovascular intact Sensation intact distally Intact pulses distally Dorsiflexion/Plantar flexion intact Dressing - dressing C/D/I Motor Function - intact, moving foot and toes well on exam.    Past Medical History  Diagnosis Date  . Hypertension   . GERD (gastroesophageal reflux disease)   . Diverticulosis   . Heart murmur     no doctor follows her for this  . Arthritis     joints, shoulder and knees    Assessment/Plan: 2 Days Post-Op Procedure(s) (LRB): TOTAL HIP ARTHROPLASTY (Left) HARDWARE REMOVAL/ DHS (Left) Active Problems:   S/P total hip  arthroplasty  Estimated body mass index is 33.65 kg/(m^2) as calculated from the following:   Height as of this encounter: 5\' 2"  (1.575 m).   Weight as of this encounter: 83.462 kg (184 lb). Up with therapy Discharge home with home health today after patient does steps.  Labs: Were reviewed DVT Prophylaxis - Lovenox, Foot Pumps and TED hose Weight-Bearing as tolerated to left leg Hemovac was discontinued today. Change dressing prior to being discharged  Jon R[facility] PA [facility] 06/26/2015, 7:14 AM

## 2015-06-26 NOTE — Care Management Important Message (Signed)
Important Message  Patient Details  Name: Nancy York MRN: [id number] Date of Birth: [birth year]   Medicare Important Message Given:  Yes    Collie SiadAngela Hadiya Spoerl, RN 06/26/2015, 9:46 AM

## 2015-06-26 NOTE — Care Management (Signed)
Patient now wants to use [facility]. I have notified Barbara CowerJason with [facility]. [facility]. No further RNCM needs. Lovenox price shared with patient- she agrees.

## 2015-07-16 ENCOUNTER — Emergency Department
Admission: EM | Admit: 2015-07-16 | Discharge: 2015-07-16 | Disposition: A | Discharge status: Home / Self Care | Payer: Commercial Managed Care - HMO | Source: Home / Self Care | Attending: Emergency Medicine | Admitting: Emergency Medicine

## 2015-07-16 ENCOUNTER — Emergency Department: Payer: Commercial Managed Care - HMO

## 2015-07-16 DIAGNOSIS — Z791 Long term (current) use of non-steroidal anti-inflammatories (NSAID): Secondary | ICD-10-CM

## 2015-07-16 DIAGNOSIS — Z7982 Long term (current) use of aspirin: Secondary | ICD-10-CM | POA: Insufficient documentation

## 2015-07-16 DIAGNOSIS — L299 Pruritus, unspecified: Secondary | ICD-10-CM | POA: Insufficient documentation

## 2015-07-16 DIAGNOSIS — Z792 Long term (current) use of antibiotics: Secondary | ICD-10-CM

## 2015-07-16 DIAGNOSIS — I10 Essential (primary) hypertension: Secondary | ICD-10-CM

## 2015-07-16 DIAGNOSIS — R110 Nausea: Secondary | ICD-10-CM

## 2015-07-16 DIAGNOSIS — Z880 Allergy status to penicillin: Secondary | ICD-10-CM | POA: Insufficient documentation

## 2015-07-16 DIAGNOSIS — Z7901 Long term (current) use of anticoagulants: Secondary | ICD-10-CM | POA: Insufficient documentation

## 2015-07-16 DIAGNOSIS — R1084 Generalized abdominal pain: Secondary | ICD-10-CM

## 2015-07-16 DIAGNOSIS — Z79899 Other long term (current) drug therapy: Secondary | ICD-10-CM

## 2015-07-16 DIAGNOSIS — E871 Hypo-osmolality and hyponatremia: Secondary | ICD-10-CM | POA: Diagnosis not present

## 2015-07-16 LAB — URINALYSIS COMPLETE WITH MICROSCOPIC (ARMC ONLY)
Bilirubin Urine: NEGATIVE
GLUCOSE, UA: NEGATIVE mg/dL
HGB URINE DIPSTICK: NEGATIVE
Ketones, ur: NEGATIVE mg/dL
Leukocytes, UA: NEGATIVE
NITRITE: NEGATIVE
PH: 7 (ref 5.0–8.0)
PROTEIN: NEGATIVE mg/dL
Specific Gravity, Urine: 1.015 (ref 1.005–1.030)

## 2015-07-16 LAB — CBC WITH DIFFERENTIAL/PLATELET
BASOS ABS: 0.1 10*3/uL (ref 0–0.1)
BASOS PCT: 1 %
EOS ABS: 0.3 10*3/uL (ref 0–0.7)
Eosinophils Relative: 4 %
HCT: 31.1 % — ABNORMAL LOW (ref 35.0–47.0)
HEMOGLOBIN: 10.3 g/dL — AB (ref 12.0–16.0)
Lymphocytes Relative: 40 %
Lymphs Abs: 2.6 10*3/uL (ref 1.0–3.6)
MCH: 27.4 pg (ref 26.0–34.0)
MCHC: 33 g/dL (ref 32.0–36.0)
MCV: 82.9 fL (ref 80.0–100.0)
Monocytes Absolute: 0.6 10*3/uL (ref 0.2–0.9)
Monocytes Relative: 9 %
NEUTROS ABS: 3 10*3/uL (ref 1.4–6.5)
NEUTROS PCT: 46 %
Platelets: 391 10*3/uL (ref 150–440)
RBC: 3.75 MIL/uL — ABNORMAL LOW (ref 3.80–5.20)
RDW: 16.1 % — AB (ref 11.5–14.5)
WBC: 6.6 10*3/uL (ref 3.6–11.0)

## 2015-07-16 LAB — COMPREHENSIVE METABOLIC PANEL
ALBUMIN: 3.9 g/dL (ref 3.5–5.0)
ALK PHOS: 71 U/L (ref 38–126)
ALT: 12 U/L — ABNORMAL LOW (ref 14–54)
ANION GAP: 13 (ref 5–15)
AST: 27 U/L (ref 15–41)
BILIRUBIN TOTAL: 0.3 mg/dL (ref 0.3–1.2)
BUN: 18 mg/dL (ref 6–20)
CALCIUM: 9.4 mg/dL (ref 8.9–10.3)
CO2: 22 mmol/L (ref 22–32)
Chloride: 101 mmol/L (ref 101–111)
Creatinine, Ser: 1.03 mg/dL — ABNORMAL HIGH (ref 0.44–1.00)
GFR calc non Af Amer: 54 mL/min — ABNORMAL LOW (ref >60)
Glucose, Bld: 116 mg/dL — ABNORMAL HIGH (ref 65–99)
POTASSIUM: 3.9 mmol/L (ref 3.5–5.1)
SODIUM: 136 mmol/L (ref 135–145)
TOTAL PROTEIN: 7.3 g/dL (ref 6.5–8.1)

## 2015-07-16 LAB — TROPONIN I

## 2015-07-16 LAB — LIPASE, BLOOD: Lipase: 36 U/L (ref 11–51)

## 2015-07-16 MED ORDER — DIPHENHYDRAMINE HCL 50 MG/ML IJ SOLN
INTRAMUSCULAR | Status: AC
  Administered 2015-07-16: 12.5 mg via INTRAVENOUS
  Filled 2015-07-16: qty 1

## 2015-07-16 MED ORDER — DIPHENHYDRAMINE HCL 50 MG/ML IJ SOLN
12.5000 mg | Freq: Once | INTRAMUSCULAR | Status: AC
  Administered 2015-07-16: 12.5 mg via INTRAVENOUS

## 2015-07-16 MED ORDER — LACTULOSE 10 GM/15ML PO SOLN: 20.0000 g | Freq: Every day | ORAL | Status: DC | PRN

## 2015-07-16 MED ORDER — DIAZEPAM 5 MG/ML IJ SOLN
1.0000 mg | Freq: Once | INTRAMUSCULAR | Status: AC
  Administered 2015-07-16: 1 mg via INTRAVENOUS

## 2015-07-16 MED ORDER — DIAZEPAM 5 MG/ML IJ SOLN
INTRAMUSCULAR | Status: AC
  Filled 2015-07-16: qty 2

## 2015-07-16 MED ORDER — MORPHINE SULFATE (PF) 2 MG/ML IV SOLN
INTRAVENOUS | Status: AC
  Administered 2015-07-16: 2 mg via INTRAVENOUS
  Filled 2015-07-16: qty 1

## 2015-07-16 MED ORDER — HALOPERIDOL LACTATE 5 MG/ML IJ SOLN
2.5000 mg | Freq: Once | INTRAMUSCULAR | Status: AC
  Administered 2015-07-16: 2.5 mg via INTRAVENOUS
  Filled 2015-07-16: qty 1

## 2015-07-16 MED ORDER — DICYCLOMINE HCL 20 MG PO TABS: 20.0000 mg | ORAL_TABLET | Freq: Four times a day (QID) | ORAL | Status: DC | PRN

## 2015-07-16 MED ORDER — SODIUM CHLORIDE 0.9 % IV BOLUS (SEPSIS)
1000.0000 mL | Freq: Once | INTRAVENOUS | Status: AC
  Administered 2015-07-16: 1000 mL via INTRAVENOUS

## 2015-07-16 MED ORDER — DIPHENHYDRAMINE HCL 50 MG/ML IJ SOLN: 12.5000 mg | Freq: Once | INTRAMUSCULAR | Status: DC

## 2015-07-16 MED ORDER — MORPHINE SULFATE (PF) 2 MG/ML IV SOLN
2.0000 mg | Freq: Once | INTRAVENOUS | Status: AC
  Administered 2015-07-16: 2 mg via INTRAVENOUS

## 2015-07-16 MED ORDER — HYDROMORPHONE HCL 1 MG/ML IJ SOLN: 0.5000 mg | Freq: Once | INTRAMUSCULAR | Status: DC

## 2015-07-16 MED ORDER — IOPAMIDOL (ISOVUE-300) INJECTION 61%
80.0000 mL | Freq: Once | INTRAVENOUS | Status: AC | PRN
  Administered 2015-07-16: 80 mL via INTRAVENOUS

## 2015-07-16 NOTE — ED Notes (Signed)
MD at bedside. 

## 2015-07-16 NOTE — Discharge Instructions (Signed)
1. You may take laxatives as needed (lactulose). 2. Take Bentyl as needed for abdominal discomfort (#20). 3. Return to the ER for worsening symptoms, persistent vomiting, difficulty breathing, tongue or facial swelling, or other concerns.  Abdominal Pain, Adult Many things can cause abdominal pain. Usually, abdominal pain is not caused by a disease and will improve without treatment. It can often be observed and treated at home. Your health care provider will do a physical exam and possibly order blood tests and X-rays to help determine the seriousness of your pain. However, in many cases, more time must pass before a clear cause of the pain can be found. Before that point, your health care provider may not know if you need more testing or further treatment. HOME CARE INSTRUCTIONS Monitor your abdominal pain for any changes. The following actions may help to alleviate any discomfort you are experiencing:  Only take over-the-counter or prescription medicines as directed by your health care provider.  Do not take laxatives unless directed to do so by your health care provider.  Try a clear liquid diet (broth, tea, or water) as directed by your health care provider. Slowly move to a bland diet as tolerated. SEEK MEDICAL CARE IF:  You have unexplained abdominal pain.  You have abdominal pain associated with nausea or diarrhea.  You have pain when you urinate or have a bowel movement.  You experience abdominal pain that wakes you in the night.  You have abdominal pain that is worsened or improved by eating food.  You have abdominal pain that is worsened with eating fatty foods.  You have a fever. SEEK IMMEDIATE MEDICAL CARE IF:  Your pain does not go away within 2 hours.  You keep throwing up (vomiting).  Your pain is felt only in portions of the abdomen, such as the right side or the left lower portion of the abdomen.  You pass bloody or black tarry stools. MAKE SURE  YOU:  Understand these instructions.  Will watch your condition.  Will get help right away if you are not doing well or get worse.   This information is not intended to replace advice given to you by your health care provider. Make sure you discuss any questions you have with your health care provider.   Document Released: 01/19/2005 Document Revised: 12/31/2014 Document Reviewed: 12/19/2012 Elsevier Interactive Patient Education 2016 [employer] Inc[employer]  Pruritus Pruritus is an itching feeling. There are many different conditions and factors that can make your skin itchy. Dry skin is one of the most common causes of itching. Most cases of itching do not require medical attention. Itchy skin can turn into a rash.  HOME CARE INSTRUCTIONS  Watch your pruritus for any changes. Take these steps to help with your condition:  Skin Care  Moisturize your skin as needed. A moisturizer that contains petroleum jelly is best for keeping moisture in your skin.  Take or apply medicines only as directed by your health care provider. This may include:  Corticosteroid cream.  Anti-itch lotions.  Oral anti-histamines.  Apply cool compresses to the affected areas.  Try taking a bath with:  Epsom salts. Follow the instructions on the packaging. You can get these at your local pharmacy or grocery store.  Baking soda. Pour a small amount into the bath as directed by your health care provider.  Colloidal oatmeal. Follow the instructions on the packaging. You can get this at your local pharmacy or grocery store.  Try applying baking soda paste to  your skin. Stir water into baking soda until it reaches a paste-like consistency.   Do not scratch your skin.  Avoid hot showers or baths, which can make itching worse. A cold shower may help with itching as long as you use a moisturizer after.  Avoid scented soaps, detergents, and perfumes. Use gentle soaps, detergents, perfumes, and other cosmetic  products. General Instructions  Avoid wearing tight clothes.  Keep a journal to help track what causes your itch. Write down:  What you eat.  What cosmetic products you use.  What you drink.  What you wear. This includes jewelry.  Use a humidifier. This keeps the air moist, which helps to prevent dry skin. SEEK MEDICAL CARE IF:  The itching does not go away after several days.  You sweat at night.  You have weight loss.  You are unusually thirsty.  You urinate more than normal.  You are more tired than normal.  You have abdominal pain.  Your skin tingles.  You feel weak.  Your skin or the whites of your eyes look yellow (jaundice).  Your skin feels numb.   This information is not intended to replace advice given to you by your health care provider. Make sure you discuss any questions you have with your health care provider.   Document Released: 12/22/2010 Document Revised: 08/26/2014 Document Reviewed: 04/07/2014 Elsevier Interactive Patient Education [employer] Else[employer] Inc.

## 2015-07-16 NOTE — ED Provider Notes (Signed)
[facility] Emergency Department Provider Note  ____________________________________________  Time seen: Approximately 1:44 AM  I have reviewed the triage vital signs and the nursing notes.   HISTORY  Chief Complaint Abdominal Pain    HPI SHAKIARA LUKIC is a 70 y.o. female who presents to the ED from home with a chief complaint of generalized pruritis and abdominal pain.Patient is on her second course of Septra for a wound cellulitis s/p left THR earlier this month. Onset of itching yesterday associated with generalized abdominal pain. She took a probiotic at the recommendation of her pharmacist which she states is not help her symptoms. Symptoms associated by nausea, no vomiting or diarrhea. Denies fever, chills, chest pain, shortness of breath. States mild dysuria yesterday but none today. Denies constipation; last BM yesterday. Denies recent travel or trauma.   Past Medical History  Diagnosis Date  . Hypertension   . GERD (gastroesophageal reflux disease)   . Diverticulosis   . Heart murmur     no doctor follows her for this  . Arthritis     joints, shoulder and knees    Patient Active Problem List   Diagnosis Date Noted  . 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 OA (osteoarthritis) of knee 08/24/2014    Past Surgical History  Procedure Laterality Date  . Tonsillectomy    . Dilation and curettage of uterus    . Rotator cuff repair Right 2006    Dr. Gerrit Heck   . Knee arthroscopy Left   . Bilateral carpal tunnel release Bilateral   . 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 Reita Chard  . Fracture surgery Left 2007    fractured left  hip  . Joint replacement Right 08/2014    total knee replacement  . Total hip arthroplasty Left 06/24/2015    Procedure: TOTAL HIP ARTHROPLASTY;  Surgeon: Donato Heinz, MD;  Location: [facility] ORS;  Service: Orthopedics;  Laterality: Left;  . Hardware removal Left  06/24/2015    Procedure: HARDWARE REMOVAL/ DHS;  Surgeon: Donato Heinz, MD;  Location: [facility] ORS;  Service: Orthopedics;  Laterality: Left;    Current Outpatient Rx  Name  Route  Sig  Dispense  Refill  . sulfamethoxazole-trimethoprim (BACTRIM DS,SEPTRA DS) 800-160 MG tablet   Oral   Take 1 tablet by mouth 2 (two) times daily.         Marland Kitchen aspirin EC 81 MG tablet   Oral   Take 81 mg by mouth daily.         . calcium citrate-vitamin D (CITRACAL+D) 315-200 MG-UNIT per tablet   Oral   Take 1 tablet by mouth 3 (three) times daily.          . cetirizine (ZYRTEC) 10 MG tablet   Oral   Take 10 mg by mouth daily. Reported on 06/24/2015         . enoxaparin (LOVENOX) 40 MG/0.4ML injection   Subcutaneous   Inject 0.4 mLs (40 mg total) into the skin daily.   14 Syringe   0   . Flaxseed, Linseed, (FLAX SEED OIL) 1000 MG CAPS   Oral   Take 1 capsule by mouth 3 (three) times daily.         . Ginkgo Biloba 120 MG CAPS   Oral   Take 120 mg by mouth daily.          . hydrochlorothiazide (HYDRODIURIL) 25 MG tablet   Oral  Take 25 mg by mouth daily.         . Inulin (FIBER CHOICE FRUITY BITES PO)   Oral   Take 1 Dose by mouth once.         . loratadine (CLARITIN) 10 MG tablet   Oral   Take 10 mg by mouth daily.         . meclizine (ANTIVERT) 25 MG tablet   Oral   Take 25 mg by mouth 3 (three) times daily as needed for dizziness.         . meloxicam (MOBIC) 7.5 MG tablet   Oral   Take 7.5 mg by mouth 2 (two) times daily.         . metoprolol succinate (TOPROL-XL) 25 MG 24 hr tablet   Oral   Take 25 mg by mouth daily.         . Misc Natural Products (OSTEO BI-FLEX TRIPLE STRENGTH PO)   Oral   Take 1,200 mg by mouth 2 (two) times daily.         Marland Kitchen oxyCODONE (OXY IR/ROXICODONE) 5 MG immediate release tablet   Oral   Take 1-2 tablets (5-10 mg total) by mouth every 4 (four) hours as needed for severe pain.   80 tablet   0   . pantoprazole (PROTONIX) 40  MG tablet   Oral   Take 40 mg by mouth daily.         . Plant Sterols and Stanols (CHOLESTOFF PO)   Oral   Take 2 capsules by mouth 2 (two) times daily.         . traMADol (ULTRAM) 50 MG tablet   Oral   Take 1-2 tablets (50-100 mg total) by mouth every 4 (four) hours as needed for moderate pain.   60 tablet   1   . vitamin B-12 (CYANOCOBALAMIN) 500 MCG tablet   Oral   Take 1,000 mcg by mouth daily.            Allergies Statins; Penicillins; and Tape  Family History  Problem Relation Age of Onset  . Breast cancer Neg Hx   . Diabetes Father     Social History Social History  Substance Use Topics  . Smoking status: Never Smoker   . Smokeless tobacco: Never Used     Comment: no passive smoke in household  . Alcohol Use: No    Review of Systems  Constitutional: No fever/chills. Eyes: No visual changes. ENT: No sore throat. Cardiovascular: Denies chest pain. Respiratory: Denies shortness of breath. Gastrointestinal: Positive for abdominal pain.  Positive for nausea, no vomiting.  No diarrhea.  No constipation. Genitourinary: Negative for dysuria. Musculoskeletal: Negative for back pain. Skin: Positive for generalized pruritis. Negative for rash. Neurological: Negative for headaches, focal weakness or numbness.  10-point ROS otherwise negative.  ____________________________________________   PHYSICAL EXAM:  VITAL SIGNS: ED Triage Vitals  Enc Vitals Group     BP 07/16/15 0056 127/100 mmHg     Pulse Rate 07/16/15 0056 104     Resp 07/16/15 0056 18     Temp 07/16/15 0056 98 F (36.7 C)     Temp Source 07/16/15 0056 Oral     SpO2 07/16/15 0056 100 %     Weight 07/16/15 0056 182 lb (82.555 kg)     Height 07/16/15 0056 5\' 2"  (1.575 m)     Head Cir --      Peak Flow --      Pain Score 07/16/15  0057 10     Pain Loc --      Pain Edu? --      Excl. in GC? --     Constitutional: Alert and oriented. Well appearing and in mild acute distress. Appears  restless and actively scratching. Eyes: Conjunctivae are normal. PERRL. EOMI. Head: Atraumatic. Nose: No congestion/rhinnorhea. Mouth/Throat: Mucous membranes are moist.  Oropharynx non-erythematous. Neck: No stridor.   Cardiovascular: Normal rate, regular rhythm. Grossly normal heart sounds.  Good peripheral circulation. Respiratory: Normal respiratory effort.  No retractions. Lungs CTAB. Gastrointestinal: Soft and mildly tender to palpation diffusely without rebound or guarding. No distention. No abdominal bruits. No CVA tenderness. Musculoskeletal: Left hip incision without active erythema or warmth. No lower extremity tenderness nor edema.  No joint effusions. Neurologic:  Normal speech and language. No gross focal neurologic deficits are appreciated. No gait instability. Skin:  Skin is warm, dry and intact. No rash noted. No urticaria. Psychiatric: Mood and affect are normal. Speech and behavior are normal.  ____________________________________________   LABS (all labs ordered are listed, but only abnormal results are displayed)  Labs Reviewed  CBC WITH DIFFERENTIAL/PLATELET - Abnormal; Notable for the following:    RBC 3.75 (*)    Hemoglobin 10.3 (*)    HCT 31.1 (*)    RDW 16.1 (*)    All other components within normal limits  COMPREHENSIVE METABOLIC PANEL - Abnormal; Notable for the following:    Glucose, Bld 116 (*)    Creatinine, Ser 1.03 (*)    ALT 12 (*)    GFR calc non Af Amer 54 (*)    All other components within normal limits  URINALYSIS COMPLETEWITH MICROSCOPIC ([facility] ONLY) - Abnormal; Notable for the following:    Color, Urine YELLOW (*)    APPearance HAZY (*)    Bacteria, UA RARE (*)    Squamous Epithelial / LPF 6-30 (*)    All other components within normal limits  LIPASE, BLOOD  TROPONIN I   ____________________________________________  EKG  ED ECG REPORT I, Shanay Woolman J, the attending physician, personally viewed and interpreted this ECG.   Date:  07/16/2015  EKG Time: 0108  Rate: 97  Rhythm: normal EKG, normal sinus rhythm  Axis: Normal  Intervals:none  ST&T Change: Nonspecific  ____________________________________________  RADIOLOGY  CT abdomen and pelvis with contrast interpreted per Dr. Gwenyth Bender: No acute intra-abdominal pelvic pathology. ____________________________________________   PROCEDURES  Procedure(s) performed: None  Critical Care performed: No  ____________________________________________   INITIAL IMPRESSION / ASSESSMENT AND PLAN / ED COURSE  Pertinent labs & imaging results that were available during my care of the patient were reviewed by me and considered in my medical decision making (see chart for details).  70 year old female who presents with generalized pruritis and abdominal pain. Will initiate screening lab work, IV analgesia and Benadryl for itching. No urticaria noted on exam; will hold off on steroids for now. Obtain CT scan to evaluate for intra-abdominal pathology.  ----------------------------------------- 3:00 AM on 07/16/2015 -----------------------------------------  No effect after 2 doses of IV morphine, Benadryl and Valium. Patient is anxious and agitated. Will try another dose of Valium for muscle relaxation as well as anxiolytic.   ----------------------------------------- 3:54 AM on 07/16/2015 -----------------------------------------  Updated patient and family of negative CT imaging results. Patient continues to complain of severe itching and abdominal pain. Discussed with hospitalist for admission; recommends trying IV haldol for intractable pain/nausea/pruritis.  ----------------------------------------- 5:10 AM on 07/16/2015 -----------------------------------------  Patient is much improved. She is sitting up, smiling,  sipping on ginger ale. States itching and pain has greatly improved. States if CT was negative for intra-abdominal pathology then she desires to go  home. Will write prescriptions for Bentyl and lactulose. She states she has antiemetic at home. She will follow up with her PCP this week. We discussed that I do not think she is having an allergic reaction to her antibiotics since she has been taking it for 2 rounds without adverse effect. There is no angioedema nor urticaria on exam tonight. Strict return precautions given. Patient verbalizes understanding and agrees with plan of care. ____________________________________________   FINAL CLINICAL IMPRESSION(S) / ED DIAGNOSES  Final diagnoses:  Generalized abdominal pain  Intractable abdominal pain  Pruritus      Irean HongJade J Shauni Henner, MD 07/16/15 [phone]

## 2015-07-16 NOTE — ED Notes (Addendum)
Patient ambulatory to triage with steady gait, without difficulty, appears very anxious, scratching; pt c/o generalized abd pain accomp by nausea;  Currently taking antibiotics for cellulitis for recent THR; st took 2 benadryl at 930pm for itching; pt taken to room 25 via w/c and placed on card monitor

## 2015-07-16 NOTE — ED Notes (Signed)
Patient continues to call out for her nurse. This RN went in to assist with her needs and she is very histrionic, moaning, rolling around on stretcher stating that she is hurting and that the  Benadryl and valium are not working. Notified primary RN as well as MD.

## 2015-07-18 ENCOUNTER — Inpatient Hospital Stay
Admission: EM | Admit: 2015-07-18 | Discharge: 2015-07-21 | DRG: 641 | Disposition: A | Discharge status: Home / Self Care | Payer: Commercial Managed Care - HMO | Attending: Internal Medicine | Admitting: Internal Medicine

## 2015-07-18 ENCOUNTER — Encounter: Payer: Self-pay | Admitting: Emergency Medicine

## 2015-07-18 DIAGNOSIS — Z7982 Long term (current) use of aspirin: Secondary | ICD-10-CM

## 2015-07-18 DIAGNOSIS — Z96642 Presence of left artificial hip joint: Secondary | ICD-10-CM | POA: Diagnosis present

## 2015-07-18 DIAGNOSIS — J309 Allergic rhinitis, unspecified: Secondary | ICD-10-CM | POA: Diagnosis present

## 2015-07-18 DIAGNOSIS — R531 Weakness: Secondary | ICD-10-CM

## 2015-07-18 DIAGNOSIS — Z888 Allergy status to other drugs, medicaments and biological substances status: Secondary | ICD-10-CM

## 2015-07-18 DIAGNOSIS — E860 Dehydration: Secondary | ICD-10-CM | POA: Diagnosis present

## 2015-07-18 DIAGNOSIS — E871 Hypo-osmolality and hyponatremia: Principal | ICD-10-CM | POA: Diagnosis present

## 2015-07-18 DIAGNOSIS — R011 Cardiac murmur, unspecified: Secondary | ICD-10-CM | POA: Diagnosis present

## 2015-07-18 DIAGNOSIS — Z9889 Other specified postprocedural states: Secondary | ICD-10-CM

## 2015-07-18 DIAGNOSIS — I10 Essential (primary) hypertension: Secondary | ICD-10-CM | POA: Diagnosis present

## 2015-07-18 DIAGNOSIS — Z96651 Presence of right artificial knee joint: Secondary | ICD-10-CM | POA: Diagnosis present

## 2015-07-18 DIAGNOSIS — R0981 Nasal congestion: Secondary | ICD-10-CM

## 2015-07-18 DIAGNOSIS — K219 Gastro-esophageal reflux disease without esophagitis: Secondary | ICD-10-CM | POA: Diagnosis present

## 2015-07-18 DIAGNOSIS — R591 Generalized enlarged lymph nodes: Secondary | ICD-10-CM

## 2015-07-18 DIAGNOSIS — D649 Anemia, unspecified: Secondary | ICD-10-CM | POA: Diagnosis present

## 2015-07-18 DIAGNOSIS — M1990 Unspecified osteoarthritis, unspecified site: Secondary | ICD-10-CM | POA: Diagnosis present

## 2015-07-18 DIAGNOSIS — Z880 Allergy status to penicillin: Secondary | ICD-10-CM

## 2015-07-18 DIAGNOSIS — Z79899 Other long term (current) drug therapy: Secondary | ICD-10-CM

## 2015-07-18 DIAGNOSIS — Z833 Family history of diabetes mellitus: Secondary | ICD-10-CM

## 2015-07-18 DIAGNOSIS — E861 Hypovolemia: Secondary | ICD-10-CM | POA: Diagnosis present

## 2015-07-18 DIAGNOSIS — F329 Major depressive disorder, single episode, unspecified: Secondary | ICD-10-CM | POA: Diagnosis present

## 2015-07-18 LAB — COMPREHENSIVE METABOLIC PANEL
ALBUMIN: 3.6 g/dL (ref 3.5–5.0)
ALT: 14 U/L (ref 14–54)
AST: 26 U/L (ref 15–41)
Alkaline Phosphatase: 55 U/L (ref 38–126)
Anion gap: 11 (ref 5–15)
BUN: 18 mg/dL (ref 6–20)
CHLORIDE: 87 mmol/L — AB (ref 101–111)
CO2: 23 mmol/L (ref 22–32)
Calcium: 8.5 mg/dL — ABNORMAL LOW (ref 8.9–10.3)
Creatinine, Ser: 0.78 mg/dL (ref 0.44–1.00)
GFR calc Af Amer: >60 mL/min (ref >60)
GFR calc non Af Amer: >60 mL/min (ref >60)
GLUCOSE: 111 mg/dL — AB (ref 65–99)
POTASSIUM: 3.6 mmol/L (ref 3.5–5.1)
Sodium: 121 mmol/L — ABNORMAL LOW (ref 135–145)
Total Bilirubin: 0.2 mg/dL — ABNORMAL LOW (ref 0.3–1.2)
Total Protein: 6.9 g/dL (ref 6.5–8.1)

## 2015-07-18 LAB — CBC
HEMATOCRIT: 27.9 % — AB (ref 35.0–47.0)
Hemoglobin: 9.5 g/dL — ABNORMAL LOW (ref 12.0–16.0)
MCH: 27.3 pg (ref 26.0–34.0)
MCHC: 34 g/dL (ref 32.0–36.0)
MCV: 80.4 fL (ref 80.0–100.0)
Platelets: 328 10*3/uL (ref 150–440)
RBC: 3.47 MIL/uL — ABNORMAL LOW (ref 3.80–5.20)
RDW: 16 % — AB (ref 11.5–14.5)
WBC: 8.1 10*3/uL (ref 3.6–11.0)

## 2015-07-18 LAB — URINALYSIS COMPLETE WITH MICROSCOPIC (ARMC ONLY)
Bilirubin Urine: NEGATIVE
Glucose, UA: NEGATIVE mg/dL
Hgb urine dipstick: NEGATIVE
Ketones, ur: NEGATIVE mg/dL
Leukocytes, UA: NEGATIVE
Nitrite: NEGATIVE
PROTEIN: 30 mg/dL — AB
SPECIFIC GRAVITY, URINE: 1.02 (ref 1.005–1.030)
pH: 6 (ref 5.0–8.0)

## 2015-07-18 LAB — BASIC METABOLIC PANEL
ANION GAP: 9 (ref 5–15)
BUN: 19 mg/dL (ref 6–20)
CO2: 22 mmol/L (ref 22–32)
Calcium: 8.3 mg/dL — ABNORMAL LOW (ref 8.9–10.3)
Chloride: 90 mmol/L — ABNORMAL LOW (ref 101–111)
Creatinine, Ser: 0.76 mg/dL (ref 0.44–1.00)
GFR calc Af Amer: >60 mL/min (ref >60)
Glucose, Bld: 113 mg/dL — ABNORMAL HIGH (ref 65–99)
POTASSIUM: 3.6 mmol/L (ref 3.5–5.1)
SODIUM: 121 mmol/L — AB (ref 135–145)

## 2015-07-18 MED ORDER — SODIUM CHLORIDE 0.9 % IV BOLUS (SEPSIS)
500.0000 mL | Freq: Once | INTRAVENOUS | Status: AC
  Administered 2015-07-18: 1000 mL via INTRAVENOUS

## 2015-07-18 MED ORDER — METOPROLOL SUCCINATE ER 25 MG PO TB24
25.0000 mg | ORAL_TABLET | Freq: Every day | ORAL | Status: DC
  Administered 2015-07-19 – 2015-07-21 (×3): 25 mg via ORAL
  Filled 2015-07-18 (×4): qty 1

## 2015-07-18 MED ORDER — SODIUM CHLORIDE 0.9 % IV SOLN
INTRAVENOUS | Status: AC
  Administered 2015-07-18: 23:00:00 via INTRAVENOUS

## 2015-07-18 MED ORDER — ACETAMINOPHEN 325 MG PO TABS: 650.0000 mg | ORAL_TABLET | Freq: Four times a day (QID) | ORAL | Status: DC | PRN

## 2015-07-18 MED ORDER — OXYCODONE HCL 5 MG PO TABS: 5.0000 mg | ORAL_TABLET | ORAL | Status: DC | PRN

## 2015-07-18 MED ORDER — ONDANSETRON HCL 4 MG PO TABS: 4.0000 mg | ORAL_TABLET | Freq: Four times a day (QID) | ORAL | Status: DC | PRN

## 2015-07-18 MED ORDER — ACETAMINOPHEN 650 MG RE SUPP: 650.0000 mg | Freq: Four times a day (QID) | RECTAL | Status: DC | PRN

## 2015-07-18 MED ORDER — PANTOPRAZOLE SODIUM 40 MG PO TBEC
40.0000 mg | DELAYED_RELEASE_TABLET | Freq: Every day | ORAL | Status: DC
  Administered 2015-07-19 – 2015-07-21 (×3): 40 mg via ORAL
  Filled 2015-07-18 (×3): qty 1

## 2015-07-18 MED ORDER — ONDANSETRON HCL 4 MG/2ML IJ SOLN
4.0000 mg | Freq: Four times a day (QID) | INTRAMUSCULAR | Status: DC | PRN
  Administered 2015-07-19: 4 mg via INTRAVENOUS
  Filled 2015-07-18: qty 2

## 2015-07-18 MED ORDER — ENOXAPARIN SODIUM 40 MG/0.4ML ~~LOC~~ SOLN
40.0000 mg | SUBCUTANEOUS | Status: DC
Start: 2015-07-18 — End: 2015-07-20
  Administered 2015-07-18 – 2015-07-19 (×2): 40 mg via SUBCUTANEOUS
  Filled 2015-07-18 (×2): qty 0.4

## 2015-07-18 MED ORDER — ASPIRIN EC 81 MG PO TBEC
81.0000 mg | DELAYED_RELEASE_TABLET | Freq: Every day | ORAL | Status: DC
  Administered 2015-07-19 – 2015-07-21 (×3): 81 mg via ORAL
  Filled 2015-07-18 (×3): qty 1

## 2015-07-18 NOTE — ED Notes (Signed)
Patient arrives from home with husband with complaint of N/V since this morning with weakness and dizziness. Patient states "the dizziness isnt like my vertigo"

## 2015-07-18 NOTE — ED Provider Notes (Signed)
[facility] Emergency Department Provider Note  Time seen: 8:14 PM  I have reviewed the triage vital signs and the nursing notes.   HISTORY  Chief Complaint Nausea; Emesis; Weakness; and Dizziness    HPI Nancy York is a 70 y.o. female with a past medical history of hypertension, gastric reflux, recent hip replacement. Patient presents the emergency department with generalized weakness, fatigue and somnolence. Patient states for the past 48 hours she has been extremely weak, difficulty walking or getting up. Has been extremely nauseated had 2 episodes of vomiting, one episode of diarrhea. States the weakness has worsened significantly today so she came to the department for evaluation. Patient was seen in the emergency department 3 days ago for abdominal pain with a largely negative workup including blood work and CT scan. Patient was prescribed lactulose and Bentyl she states she took 1 or 2 doses of each and the abdominal pain was gone so she no longer take any of these medications. Asians does state she recently completed a course of Bactrim due to a mild cellulitis around her left hip incision. Denies fever, cough, congestion. Denies abdominal pain today. Denies chest pain.     Past Medical History  Diagnosis Date  . Hypertension   . GERD (gastroesophageal reflux disease)   . Diverticulosis   . Heart murmur     no doctor follows her for this  . Arthritis     joints, shoulder and knees    Patient Active Problem List   Diagnosis Date Noted  . 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 OA (osteoarthritis) of knee 08/24/2014    Past Surgical History  Procedure Laterality Date  . Tonsillectomy    . Dilation and curettage of uterus    . Rotator cuff repair Right 2006    Dr. Gerrit Heck   . Knee arthroscopy Left   . Bilateral carpal tunnel release Bilateral   . 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  Reita Chard  . Fracture surgery Left 2007    fractured left  hip  . Joint replacement Right 08/2014    total knee replacement  . Total hip arthroplasty Left 06/24/2015    Procedure: TOTAL HIP ARTHROPLASTY;  Surgeon: Donato Heinz, MD;  Location: [facility] ORS;  Service: Orthopedics;  Laterality: Left;  . Hardware removal Left 06/24/2015    Procedure: HARDWARE REMOVAL/ DHS;  Surgeon: Donato Heinz, MD;  Location: [facility] ORS;  Service: Orthopedics;  Laterality: Left;    Current Outpatient Rx  Name  Route  Sig  Dispense  Refill  . aspirin EC 81 MG tablet   Oral   Take 81 mg by mouth daily.         . calcium citrate-vitamin D (CITRACAL+D) 315-200 MG-UNIT per tablet   Oral   Take 1 tablet by mouth 3 (three) times daily.          . Flaxseed, Linseed, (FLAX SEED OIL) 1000 MG CAPS   Oral   Take 1 capsule by mouth 3 (three) times daily.         . Ginkgo Biloba 120 MG CAPS   Oral   Take 120 mg by mouth daily.          . hydrochlorothiazide (HYDRODIURIL) 25 MG tablet   Oral   Take 25 mg by mouth daily.         . Inulin (FIBER CHOICE FRUITY BITES PO)   Oral  Take 1 Dose by mouth once.         . loratadine (CLARITIN) 10 MG tablet   Oral   Take 10 mg by mouth daily.         . metoprolol succinate (TOPROL-XL) 25 MG 24 hr tablet   Oral   Take 25 mg by mouth daily.         Marland Kitchen. oxyCODONE (OXY IR/ROXICODONE) 5 MG immediate release tablet   Oral   Take 1-2 tablets (5-10 mg total) by mouth every 4 (four) hours as needed for severe pain.   80 tablet   0   . pantoprazole (PROTONIX) 40 MG tablet   Oral   Take 40 mg by mouth daily.         . Plant Sterols and Stanols (CHOLESTOFF PO)   Oral   Take 2 capsules by mouth 2 (two) times daily.         . traMADol (ULTRAM) 50 MG tablet   Oral   Take 1-2 tablets (50-100 mg total) by mouth every 4 (four) hours as needed for moderate pain.   60 tablet   1   . vitamin B-12 (CYANOCOBALAMIN) 500 MCG tablet   Oral   Take 1,000 mcg  by mouth daily.          Marland Kitchen. dicyclomine (BENTYL) 20 MG tablet   Oral   Take 1 tablet (20 mg total) by mouth every 6 (six) hours as needed.   20 tablet   0   . enoxaparin (LOVENOX) 40 MG/0.4ML injection   Subcutaneous   Inject 0.4 mLs (40 mg total) into the skin daily. Patient not taking: Reported on 07/16/2015   14 Syringe   0   . lactulose (CHRONULAC) 10 GM/15ML solution   Oral   Take 30 mLs (20 g total) by mouth daily as needed for mild constipation.   120 mL   0   . meclizine (ANTIVERT) 25 MG tablet   Oral   Take 25 mg by mouth 3 (three) times daily as needed for dizziness.         . sulfamethoxazole-trimethoprim (BACTRIM DS,SEPTRA DS) 800-160 MG tablet   Oral   Take 1 tablet by mouth 2 (two) times daily.           Allergies Statins; Penicillins; and Tape  Family History  Problem Relation Age of Onset  . Breast cancer Neg Hx   . Diabetes Father     Social History Social History  Substance Use Topics  . Smoking status: Never Smoker   . Smokeless tobacco: Never Used     Comment: no passive smoke in household  . Alcohol Use: No    Review of Systems Constitutional: Negative for fever. Cardiovascular: Negative for chest pain. Respiratory: Negative for shortness of breath. Gastrointestinal: Abdominal pain 3 days ago which is gone. 2 episodes of vomiting, one episode of diarrhea today. Genitourinary: Negative for dysuria. Neurological: Negative for . Positive for generalized weakness. 10-point ROS otherwise negative.  ____________________________________________   PHYSICAL EXAM:  VITAL SIGNS: ED Triage Vitals  Enc Vitals Group     BP 07/18/15 1611 122/48 mmHg     Pulse Rate 07/18/15 1611 69     Resp 07/18/15 1611 18     Temp 07/18/15 1611 98 F (36.7 C)     Temp Source 07/18/15 1611 Oral     SpO2 07/18/15 1611 97 %     Weight 07/18/15 1611 182 lb (82.555 kg)  Height 07/18/15 1611  (1.575 m)     Head Cir --      Peak Flow --       Pain Score 07/18/15 1612 0     Pain Loc --      Pain Edu? --      Excl. in GC? --     Constitutional: Alert and oriented. Well appearing and in no distress. Eyes: Normal exam ENT   Head: Normocephalic and atraumatic   Mouth/Throat: Mucous membranes are moist. Cardiovascular: Normal rate, regular rhythm. No murmur Respiratory: Normal respiratory effort without tachypnea nor retractions. Breath sounds are clear Gastrointestinal: Soft and nontender. No distention.  Musculoskeletal: Nontender with normal range of motion in all extremities.  Neurologic:  Normal speech and language. No gross focal neurologic deficits Psychiatric: Mood and affect are normal. Speech and behavior are normal.  ____________________________________________   INITIAL IMPRESSION / ASSESSMENT AND PLAN / ED COURSE  Pertinent labs & imaging results that were available during my care of the patient were reviewed by me and considered in my medical decision making (see chart for details).  Patient's labs have resulted showing a sodium of 121 which fits a clinical picture of generalized weakness due to hyponatremia. Patient had a proximally 15 point drop in her sodium over 48 hours. Possibly medication induced. 2 episodes of vomiting one episode of diarrhea I would not believe would be sufficient to drop her sodium to such a degree. We will recheck a BMP to insure accuracy. We'll start on IV fluids and admit to the hospital. Patient is agreeable to this plan.  ____________________________________________   FINAL CLINICAL IMPRESSION(S) / ED DIAGNOSES  Hyponatremia Generalized weakness   Minna Antis, MD 07/18/15 2018

## 2015-07-18 NOTE — H&P (Signed)
[facility] - [place] [facility]   PATIENT NAME: Nancy York    MR#:  [id number]  DATE OF BIRTH:  07-Jan-1946  DATE OF ADMISSION:  07/18/2015  PRIMARY CARE PHYSICIAN: Marguarite Arbour, MD   REQUESTING/REFERRING PHYSICIAN: Paduchowski, MD  CHIEF COMPLAINT:   Chief Complaint  Patient presents with  . Nausea  . Emesis  . Weakness  . Dizziness    HISTORY OF PRESENT ILLNESS:  Luther Newhouse  is a 70 y.o. female who presents with increasing weakness for the past 2 days. Patient states that she was here in the ED couple of days ago with abdominal discomfort. Workup was largely negative, so she was sent out with a prescription for Bentyl and lactulose she took is a couple of times, had a short bout of a couple of diarrheic stools, and had an episode of vomiting this morning. She notes that her weakness has been getting worse since her visit to the ED here. Today she was standing in her kitchen with her walker and was unable to do so, and nearly fell. Her husband brought her to the ED for further evaluation. She recently had a total left hip replacement, but had been doing okay status post surgery prior to the past couple of days. Here in the ED her workup was largely negative except for some electrolyte disturbances, specifically sodium of 121 and a low chloride as well. Hospitalists were called for admission.  PAST MEDICAL HISTORY:   Past Medical History  Diagnosis Date  . Hypertension   . GERD (gastroesophageal reflux disease)   . Diverticulosis   . Heart murmur     no doctor follows her for this  . Arthritis     joints, shoulder and knees    PAST SURGICAL HISTORY:   Past Surgical History  Procedure Laterality Date  . Tonsillectomy    . Dilation and curettage of uterus    . Rotator cuff repair Right 2006    Dr. Gerrit Heck   . Knee arthroscopy Left   . Bilateral carpal tunnel release Bilateral   . 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 Reita Chard  . Fracture surgery Left 2007    fractured left  hip  . Joint replacement Right 08/2014    total knee replacement  . Total hip arthroplasty Left 06/24/2015    Procedure: TOTAL HIP ARTHROPLASTY;  Surgeon: Donato Heinz, MD;  Location: [facility] ORS;  Service: Orthopedics;  Laterality: Left;  . Hardware removal Left 06/24/2015    Procedure: HARDWARE REMOVAL/ DHS;  Surgeon: Donato Heinz, MD;  Location: [facility] ORS;  Service: Orthopedics;  Laterality: Left;    SOCIAL HISTORY:   Social History  Substance Use Topics  . Smoking status: Never Smoker   . Smokeless tobacco: Never Used     Comment: no passive smoke in household  . Alcohol Use: No    FAMILY HISTORY:   Family History  Problem Relation Age of Onset  . Breast cancer Neg Hx   . Diabetes Father     DRUG ALLERGIES:   Allergies  Allergen Reactions  . Statins Other (See Comments)    "leg cramps"  . Penicillins Rash  . Tape Rash    Plastic tape should be better    MEDICATIONS AT HOME:   Prior to Admission medications   Medication Sig Start Date End Date Taking? Authorizing Provider  aspirin EC 81 MG tablet Take 81 mg by mouth daily.   Yes Historical  Provider, MD  calcium citrate-vitamin D (CITRACAL+D) 315-200 MG-UNIT per tablet Take 1 tablet by mouth 3 (three) times daily.    Yes Historical Provider, MD  Flaxseed, Linseed, (FLAX SEED OIL) 1000 MG CAPS Take 1 capsule by mouth 3 (three) times daily.   Yes Historical Provider, MD  Ginkgo Biloba 120 MG CAPS Take 120 mg by mouth daily.    Yes Historical Provider, MD  hydrochlorothiazide (HYDRODIURIL) 25 MG tablet Take 25 mg by mouth daily.   Yes Historical Provider, MD  Inulin (FIBER CHOICE FRUITY BITES PO) Take 1 Dose by mouth once.   Yes Historical Provider, MD  loratadine (CLARITIN) 10 MG tablet Take 10 mg by mouth daily.   Yes Historical Provider, MD  metoprolol succinate (TOPROL-XL) 25 MG 24 hr tablet Take 25 mg by mouth daily.   Yes Historical Provider, MD  oxyCODONE (OXY  IR/ROXICODONE) 5 MG immediate release tablet Take 1-2 tablets (5-10 mg total) by mouth every 4 (four) hours as needed for severe pain. 06/25/15  Yes Tera Partridge, PA  pantoprazole (PROTONIX) 40 MG tablet Take 40 mg by mouth daily.   Yes Historical Provider, MD  Plant Sterols and Stanols (CHOLESTOFF PO) Take 2 capsules by mouth 2 (two) times daily.   Yes Historical Provider, MD  traMADol (ULTRAM) 50 MG tablet Take 1-2 tablets (50-100 mg total) by mouth every 4 (four) hours as needed for moderate pain. 06/25/15  Yes Tera Partridge, PA  vitamin B-12 (CYANOCOBALAMIN) 500 MCG tablet Take 1,000 mcg by mouth daily.    Yes Historical Provider, MD  meclizine (ANTIVERT) 25 MG tablet Take 25 mg by mouth 3 (three) times daily as needed for dizziness.    Historical Provider, MD    REVIEW OF SYSTEMS:  Review of Systems  Constitutional: Negative for fever, chills, weight loss and malaise/fatigue.  HENT: Negative for ear pain, hearing loss and tinnitus.   Eyes: Negative for blurred vision, double vision, pain and redness.  Respiratory: Negative for cough, hemoptysis and shortness of breath.   Cardiovascular: Negative for chest pain, palpitations, orthopnea and leg swelling.  Gastrointestinal: Positive for nausea, vomiting and diarrhea. Negative for abdominal pain and constipation.  Genitourinary: Negative for dysuria, frequency and hematuria.  Musculoskeletal: Negative for back pain, joint pain and neck pain.  Skin:       No acne, rash, or lesions  Neurological: Positive for weakness. Negative for dizziness, tremors and focal weakness.  Endo/Heme/Allergies: Negative for polydipsia. Does not bruise/bleed easily.  Psychiatric/Behavioral: Negative for depression. The patient is not nervous/anxious and does not have insomnia.      VITAL SIGNS:   Filed Vitals:   07/18/15 1611 07/18/15 1947 07/18/15 2000 07/18/15 2030  BP: 122/48 129/53 121/68 136/65  Pulse: 69 70 72 72  Temp: 98 F (36.7 C) 97.5 F (36.4 C)     TempSrc: Oral Oral    Resp: 18 16 14 17   Height: 5\' 2"  (1.575 m)     Weight: 82.555 kg (182 lb)     SpO2: 97% 98% 97% 93%   Wt Readings from Last 3 Encounters:  07/18/15 82.555 kg (182 lb)  07/16/15 82.555 kg (182 lb)  06/24/15 83.462 kg (184 lb)    PHYSICAL EXAMINATION:  Physical Exam  Vitals reviewed. Constitutional: She is oriented to person, place, and time. She appears well-developed and well-nourished. No distress.  HENT:  Head: Normocephalic and atraumatic.  Dry mucous membranes  Eyes: Conjunctivae and EOM are normal. Pupils are equal, round, and reactive  to light. No scleral icterus.  Neck: Normal range of motion. Neck supple. No JVD present. No thyromegaly present.  Cardiovascular: Normal rate, regular rhythm and intact distal pulses.  Exam reveals no gallop and no friction rub.   No murmur heard. Respiratory: Effort normal and breath sounds normal. No respiratory distress. She has no wheezes. She has no rales.  GI: Soft. Bowel sounds are normal. She exhibits no distension. There is no tenderness.  Musculoskeletal: Normal range of motion. She exhibits no edema.  No arthritis, no gout  Lymphadenopathy:    She has no cervical adenopathy.  Neurological: She is alert and oriented to person, place, and time. No cranial nerve deficit.  No dysarthria, no aphasia  Skin: Skin is warm and dry. No rash noted. No erythema.  Left hip incision closed, dry, intact, healing well without signs or symptoms of infection.  Psychiatric: She has a normal mood and affect. Her behavior is normal. Judgment and thought content normal.    LABORATORY PANEL:   CBC  Recent Labs Lab 07/18/15 1615  WBC 8.1  HGB 9.5*  HCT 27.9*  PLT 328   ------------------------------------------------------------------------------------------------------------------  Chemistries   Recent Labs Lab 07/18/15 1615 07/18/15 2038  NA 121* 121*  K 3.6 3.6  CL 87* 90*  CO2 23 22  GLUCOSE 111* 113*   BUN 18 19  CREATININE 0.78 0.76  CALCIUM 8.5* 8.3*  AST 26  --   ALT 14  --   ALKPHOS 55  --   BILITOT 0.2*  --    ------------------------------------------------------------------------------------------------------------------  Cardiac Enzymes  Recent Labs Lab 07/16/15 0114  TROPONINI <0.03   ------------------------------------------------------------------------------------------------------------------  RADIOLOGY:  No results found.  EKG:   Orders placed or performed during the hospital encounter of 07/18/15  . EKG 12-Lead  . EKG 12-Lead  . EKG 12-Lead  . EKG 12-Lead    IMPRESSION AND PLAN:  Principal Problem:   Hyponatremia - acute in onset over the past couple of days. Coincides with her weakness, and is the likely culprit. Patient is also profoundly dehydrated. We will start initially by rehydrating her with normal saline. We will monitor her sodium for expected improvement. If it does not come back to a normal range of hydration, and will see a further with hyponatremia workup. Active Problems:   Generalized weakness - likely due to her acute hyponatremia. Treat as above, get PT consult for evaluation of the morning.   Dehydration - hydrate with normal saline tonight, monitor as above.   S/P total hip arthroplasty - healing well, not likely contributing to her acute weakness   HTN (hypertension) - patient states that she has been on hydrochlorothiazide for a long time, we will hold this as I suspect it may also have contributed to her hyponatremia along with her diarrhea, vomiting, and lactulose use. Continue her other antihypertensives   GERD (gastroesophageal reflux disease) - home dose PPI  All the records are reviewed and case discussed with ED provider. Management plans discussed with the patient and/or family.  DVT PROPHYLAXIS: SubQ lovenox  GI PROPHYLAXIS: PPI  ADMISSION STATUS: Observation  CODE STATUS: Full Code Status History    Date Active  Date Inactive Code Status Order ID Comments User Context   08/25/2014  7:10 PM 08/28/2014  3:08 PM Full Code [id number]  Donato HeinzJames P Hooten, MD Inpatient   08/25/2014  7:10 PM 08/25/2014  7:10 PM Full Code [id number]  Donato HeinzJames P Hooten, MD Inpatient    Advance Directive Documentation  Most Recent Value   Type of Advance Directive  Living will   Pre-existing out of facility DNR order (yellow form or pink MOST form)     "MOST" Form in Place?        TOTAL TIME TAKING CARE OF THIS PATIENT: 40 minutes.    Mavi Un FIELDING 07/18/2015, 9:00 PM  [facility]  Office  [phone]  CC: Primary care physician; Marguarite Arbour, MD

## 2015-07-19 DIAGNOSIS — D649 Anemia, unspecified: Secondary | ICD-10-CM | POA: Diagnosis present

## 2015-07-19 DIAGNOSIS — R202 Paresthesia of skin: Secondary | ICD-10-CM | POA: Diagnosis not present

## 2015-07-19 DIAGNOSIS — K219 Gastro-esophageal reflux disease without esophagitis: Secondary | ICD-10-CM | POA: Diagnosis present

## 2015-07-19 DIAGNOSIS — Z7982 Long term (current) use of aspirin: Secondary | ICD-10-CM | POA: Diagnosis not present

## 2015-07-19 DIAGNOSIS — M1990 Unspecified osteoarthritis, unspecified site: Secondary | ICD-10-CM | POA: Diagnosis present

## 2015-07-19 DIAGNOSIS — E860 Dehydration: Secondary | ICD-10-CM | POA: Diagnosis present

## 2015-07-19 DIAGNOSIS — Z96651 Presence of right artificial knee joint: Secondary | ICD-10-CM | POA: Diagnosis present

## 2015-07-19 DIAGNOSIS — Z9889 Other specified postprocedural states: Secondary | ICD-10-CM | POA: Diagnosis not present

## 2015-07-19 DIAGNOSIS — Z880 Allergy status to penicillin: Secondary | ICD-10-CM | POA: Diagnosis not present

## 2015-07-19 DIAGNOSIS — E871 Hypo-osmolality and hyponatremia: Secondary | ICD-10-CM | POA: Diagnosis present

## 2015-07-19 DIAGNOSIS — F329 Major depressive disorder, single episode, unspecified: Secondary | ICD-10-CM | POA: Diagnosis present

## 2015-07-19 DIAGNOSIS — R011 Cardiac murmur, unspecified: Secondary | ICD-10-CM | POA: Diagnosis present

## 2015-07-19 DIAGNOSIS — I10 Essential (primary) hypertension: Secondary | ICD-10-CM | POA: Diagnosis present

## 2015-07-19 DIAGNOSIS — J309 Allergic rhinitis, unspecified: Secondary | ICD-10-CM | POA: Diagnosis present

## 2015-07-19 DIAGNOSIS — Z79899 Other long term (current) drug therapy: Secondary | ICD-10-CM | POA: Diagnosis not present

## 2015-07-19 DIAGNOSIS — Z96642 Presence of left artificial hip joint: Secondary | ICD-10-CM | POA: Diagnosis present

## 2015-07-19 DIAGNOSIS — Z833 Family history of diabetes mellitus: Secondary | ICD-10-CM | POA: Diagnosis not present

## 2015-07-19 DIAGNOSIS — Z888 Allergy status to other drugs, medicaments and biological substances status: Secondary | ICD-10-CM | POA: Diagnosis not present

## 2015-07-19 DIAGNOSIS — E861 Hypovolemia: Secondary | ICD-10-CM | POA: Diagnosis present

## 2015-07-19 LAB — BASIC METABOLIC PANEL
Anion gap: 8 (ref 5–15)
BUN: 15 mg/dL (ref 6–20)
CALCIUM: 7.9 mg/dL — AB (ref 8.9–10.3)
CHLORIDE: 93 mmol/L — AB (ref 101–111)
CO2: 23 mmol/L (ref 22–32)
CREATININE: 0.68 mg/dL (ref 0.44–1.00)
GFR calc Af Amer: >60 mL/min (ref >60)
GFR calc non Af Amer: >60 mL/min (ref >60)
GLUCOSE: 97 mg/dL (ref 65–99)
POTASSIUM: 3.7 mmol/L (ref 3.5–5.1)
Sodium: 124 mmol/L — ABNORMAL LOW (ref 135–145)

## 2015-07-19 LAB — CBC
HCT: 26.3 % — ABNORMAL LOW (ref 35.0–47.0)
Hemoglobin: 8.9 g/dL — ABNORMAL LOW (ref 12.0–16.0)
MCH: 27.3 pg (ref 26.0–34.0)
MCHC: 33.9 g/dL (ref 32.0–36.0)
MCV: 80.4 fL (ref 80.0–100.0)
PLATELETS: 254 10*3/uL (ref 150–440)
RBC: 3.27 MIL/uL — AB (ref 3.80–5.20)
RDW: 15.5 % — ABNORMAL HIGH (ref 11.5–14.5)
WBC: 6.1 10*3/uL (ref 3.6–11.0)

## 2015-07-19 LAB — OSMOLALITY: Osmolality: 263 mOsm/kg — ABNORMAL LOW (ref 275–295)

## 2015-07-19 LAB — SODIUM, URINE, RANDOM: Sodium, Ur: 94 mmol/L

## 2015-07-19 LAB — URIC ACID: Uric Acid, Serum: 4.9 mg/dL (ref 2.3–6.6)

## 2015-07-19 LAB — OSMOLALITY, URINE: OSMOLALITY UR: 676 mosm/kg (ref 300–900)

## 2015-07-19 LAB — TSH: TSH: 1.281 u[IU]/mL (ref 0.350–4.500)

## 2015-07-19 LAB — MAGNESIUM: MAGNESIUM: 2 mg/dL (ref 1.7–2.4)

## 2015-07-19 LAB — CORTISOL: CORTISOL PLASMA: 13.9 ug/dL

## 2015-07-19 LAB — PHOSPHORUS: Phosphorus: 3.7 mg/dL (ref 2.5–4.6)

## 2015-07-19 MED ORDER — SODIUM CHLORIDE 0.9 % IV SOLN
INTRAVENOUS | Status: DC
  Administered 2015-07-19 – 2015-07-21 (×4): via INTRAVENOUS

## 2015-07-19 NOTE — Consult Note (Signed)
[facility] CONSULT NOTE    Date: 07/19/2015                  Patient Name:  Nancy York  MRN: [id number]  DOB: February 10, 1946  Age / Sex: 70 y.o., female         PCP: Marguarite Arbour, MD                 Service Requesting Consult: Dr. Amado Coe                 Reason for Consult: hyponatremia            History of Present Illness: Patient is a 70 y.o. female with a PMHx of Hypertension, GERD, diverticulosis, osteoarthritis, left total hip replacement, who was admitted to [facility] on 07/18/2015 for evaluation of nausea, vomiting, weakness, and dizziness. The patient had recent left total hip replacement. She progressed well after the hip replacement and transitioned to home with home physical therapy. On 06/26/2015 her serum sodium was 141. She was placed on Bactrim for suspected wound infection thereafter. On 07/16/2015 patient presented to the emergency department with abdominal pain. She was subsequently sent home. When she returned on 07/18/2015 she presented with a serum sodium of 121. She was having nausea, vomiting, and some loose stools at home. In addition she was taking hydrochlorothiazide. With IV fluid hydration serum sodium is now up to 124. She still having some nausea this morning along with some mild dizziness.   Medications: Outpatient medications: Prescriptions prior to admission  Medication Sig Dispense Refill Last Dose  . aspirin EC 81 MG tablet Take 81 mg by mouth daily.   07/18/2015 at Unknown time  . calcium citrate-vitamin D (CITRACAL+D) 315-200 MG-UNIT per tablet Take 1 tablet by mouth 3 (three) times daily.    07/18/2015 at Unknown time  . Flaxseed, Linseed, (FLAX SEED OIL) 1000 MG CAPS Take 1 capsule by mouth 3 (three) times daily.   07/18/2015 at Unknown time  . Ginkgo Biloba 120 MG CAPS Take 120 mg by mouth daily.    07/18/2015 at Unknown time  . hydrochlorothiazide (HYDRODIURIL) 25 MG tablet Take 25 mg by mouth daily.   07/18/2015 at Unknown time   . Inulin (FIBER CHOICE FRUITY BITES PO) Take 1 Dose by mouth once.   07/18/2015 at Unknown time  . loratadine (CLARITIN) 10 MG tablet Take 10 mg by mouth daily.   07/15/2015 at Unknown time  . meclizine (ANTIVERT) 25 MG tablet Take 25 mg by mouth 3 (three) times daily as needed for dizziness.   PRN at PRN  . metoprolol succinate (TOPROL-XL) 25 MG 24 hr tablet Take 25 mg by mouth daily.   07/18/2015 at Unknown time  . oxyCODONE (OXY IR/ROXICODONE) 5 MG immediate release tablet Take 1-2 tablets (5-10 mg total) by mouth every 4 (four) hours as needed for severe pain. 80 tablet 0 07/18/2015 at Unknown time  . pantoprazole (PROTONIX) 40 MG tablet Take 40 mg by mouth daily.   07/18/2015 at Unknown time  . Plant Sterols and Stanols (CHOLESTOFF PO) Take 2 capsules by mouth 2 (two) times daily.   07/18/2015 at Unknown time  . traMADol (ULTRAM) 50 MG tablet Take 1-2 tablets (50-100 mg total) by mouth every 4 (four) hours as needed for moderate pain. 60 tablet 1 07/18/2015 at Unknown time  . vitamin B-12 (CYANOCOBALAMIN) 500 MCG tablet Take 1,000 mcg by mouth daily.    07/18/2015 at Unknown time  Current medications: Current Facility-Administered Medications  Medication Dose Route Frequency Provider Last Rate Last Dose  . 0.9 %  sodium chloride infusion   Intravenous Continuous Ramonita Lab, MD 100 mL/hr at 07/19/15 1012    . acetaminophen (TYLENOL) tablet 650 mg  650 mg Oral Q6H PRN Oralia Manis, MD       Or  . acetaminophen (TYLENOL) suppository 650 mg  650 mg Rectal Q6H PRN Oralia Manis, MD      . aspirin EC tablet 81 mg  81 mg Oral Daily Oralia Manis, MD   81 mg at 07/19/15 1011  . enoxaparin (LOVENOX) injection 40 mg  40 mg Subcutaneous Q24H Oralia Manis, MD   40 mg at 07/18/15 2320  . metoprolol succinate (TOPROL-XL) 24 hr tablet 25 mg  25 mg Oral Daily Oralia Manis, MD   25 mg at 07/19/15 1011  . ondansetron (ZOFRAN) tablet 4 mg  4 mg Oral Q6H PRN Oralia Manis, MD       Or  . ondansetron [facility])  injection 4 mg  4 mg Intravenous Q6H PRN Oralia Manis, MD   4 mg at 07/19/15 [phone]  . oxyCODONE (Oxy IR/ROXICODONE) immediate release tablet 5-10 mg  5-10 mg Oral Q4H PRN Oralia Manis, MD      . pantoprazole (PROTONIX) EC tablet 40 mg  40 mg Oral Daily Oralia Manis, MD   40 mg at 07/19/15 1011      Allergies: Allergies  Allergen Reactions  . Statins Other (See Comments)    "leg cramps"  . Penicillins Rash  . Tape Rash    Plastic tape should be better      Past Medical History: Past Medical History  Diagnosis Date  . Hypertension   . GERD (gastroesophageal reflux disease)   . Diverticulosis   . Heart murmur     no doctor follows her for this  . Arthritis     joints, shoulder and knees     Past Surgical History: Past Surgical History  Procedure Laterality Date  . Tonsillectomy    . Dilation and curettage of uterus    . Rotator cuff repair Right 2006    Dr. Gerrit Heck   . Knee arthroscopy Left   . Bilateral carpal tunnel release Bilateral   . 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 Reita Chard  . Fracture surgery Left 2007    fractured left  hip  . Joint replacement Right 08/2014    total knee replacement  . Total hip arthroplasty Left 06/24/2015    Procedure: TOTAL HIP ARTHROPLASTY;  Surgeon: Donato Heinz, MD;  Location: [facility] ORS;  Service: Orthopedics;  Laterality: Left;  . Hardware removal Left 06/24/2015    Procedure: HARDWARE REMOVAL/ DHS;  Surgeon: Donato Heinz, MD;  Location: [facility] ORS;  Service: Orthopedics;  Laterality: Left;     Family History: Family History  Problem Relation Age of Onset  . Breast cancer Neg Hx   . Diabetes Father      Social History: Social History   Social History  . Marital Status: Married    Spouse Name: N/A  . Number of Children: N/A  . Years of Education: N/A   Occupational History  . Not on file.   Social History Main Topics  . Smoking status: Never Smoker   . Smokeless tobacco: Never Used     Comment: no  passive smoke in household  . Alcohol Use: No  . Drug Use: No  . Sexual Activity: Not  Currently   Other Topics Concern  . Not on file   Social History Narrative     Review of Systems: Review of Systems  Constitutional: Positive for malaise/fatigue. Negative for fever, chills and weight loss.  HENT: Negative for hearing loss and tinnitus.   Eyes: Negative for blurred vision and double vision.  Respiratory: Negative for cough, hemoptysis and sputum production.   Cardiovascular: Negative for chest pain, palpitations and orthopnea.  Gastrointestinal: Positive for nausea, vomiting and abdominal pain.  Genitourinary: Negative for dysuria, urgency and frequency.  Musculoskeletal: Positive for joint pain. Negative for back pain and neck pain.  Skin: Negative for itching and rash.  Neurological: Positive for dizziness. Negative for focal weakness, loss of consciousness and headaches.  Endo/Heme/Allergies: Negative for polydipsia. Does not bruise/bleed easily.  Psychiatric/Behavioral: Negative for depression. The patient is not nervous/anxious.      Vital Signs: Blood pressure 127/51, pulse 75, temperature 98.2 F (36.8 C), temperature source Oral, resp. rate 18, height 5\' 2"  (1.575 m), weight 83.825 kg (184 lb 12.8 oz), SpO2 98 %.  Weight trends: Filed Weights   07/18/15 1611 07/18/15 2206  Weight: 82.555 kg (182 lb) 83.825 kg (184 lb 12.8 oz)    Physical Exam: General: NAD, resting in bed  Head: Normocephalic, atraumatic.  Eyes: Anicteric, EOMI  Nose: Mucous membranes moist, not inflammed, nonerythematous.  Throat: Oropharynx nonerythematous, no exudate appreciated.   Neck: Supple, trachea midline.  Lungs:  Normal respiratory effort. Clear to auscultation BL without crackles or wheezes.  Heart: RRR. S1 and S2 normal without gallop, murmur, or rubs.  Abdomen:  BS normoactive. Soft, Nondistended, non-tender.  No masses or organomegaly.  Extremities: No pretibial edema.   Neurologic: A&O X3, Motor strength is 5/5 in the all 4 extremities  Skin: No visible rashes, scars.    Lab results: Basic Metabolic Panel:  Recent Labs Lab 07/18/15 1615 07/18/15 2038 07/19/15 0550  NA 121* 121* 124*  K 3.6 3.6 3.7  CL 87* 90* 93*  CO2 23 22 23   GLUCOSE 111* 113* 97  BUN 18 19 15   CREATININE 0.78 0.76 0.68  CALCIUM 8.5* 8.3* 7.9*  MG  --  2.0  --   PHOS  --  3.7  --     Liver Function Tests:  Recent Labs Lab 07/16/15 0114 07/18/15 1615  AST 27 26  ALT 12* 14  ALKPHOS 71 55  BILITOT 0.3 0.2*  PROT 7.3 6.9  ALBUMIN 3.9 3.6    Recent Labs Lab 07/16/15 0114  LIPASE 36   No results for input(s): AMMONIA in the last 168 hours.  CBC:  Recent Labs Lab 07/16/15 0114 07/18/15 1615 07/19/15 0550  WBC 6.6 8.1 6.1  NEUTROABS 3.0  --   --   HGB 10.3* 9.5* 8.9*  HCT 31.1* 27.9* 26.3*  MCV 82.9 80.4 80.4  PLT 391 328 254    Cardiac Enzymes:  Recent Labs Lab 07/16/15 0114  TROPONINI <0.03    BNP: Invalid input(s): POCBNP  CBG: No results for input(s): GLUCAP in the last 168 hours.  Microbiology: Results for orders placed or performed during the hospital encounter of 06/18/15  Surgical pcr screen     Status: Abnormal   Collection Time: 06/18/15  1:55 PM  Result Value Ref Range Status   MRSA, PCR POSITIVE (A) NEGATIVE Final    Comment: CRITICAL RESULT CALLED TO, READ BACK BY AND VERIFIED WITH: GAIL GRAVES AT 1704  ON 06/18/15 RWW    Staphylococcus aureus POSITIVE (A)  NEGATIVE Final    Comment:        The Xpert SA Assay (FDA approved for NASAL specimens in patients over 68 years of age), is one component of a comprehensive surveillance program.  Test performance has been validated by [facility] for patients greater than or equal to 73 year old. It is not intended to diagnose infection nor to guide or monitor treatment.   Urine culture     Status: None   Collection Time: 06/18/15  1:55 PM  Result Value Ref Range Status    Specimen Description URINE, RANDOM  Final   Special Requests NONE  Final   Culture INSIGNIFICANT GROWTH  Final   Report Status 06/20/2015 FINAL  Final    Coagulation Studies: No results for input(s): LABPROT, INR in the last 72 hours.  Urinalysis:  Recent Labs  07/18/15 1918  COLORURINE YELLOW*  LABSPEC 1.020  PHURINE 6.0  GLUCOSEU NEGATIVE  HGBUR NEGATIVE  BILIRUBINUR NEGATIVE  KETONESUR NEGATIVE  PROTEINUR 30*  NITRITE NEGATIVE  LEUKOCYTESUR NEGATIVE      Imaging:  No results found.   Assessment & Plan: Pt is a 70 y.o. female with a PMHx of Hypertension, GERD, diverticulosis, osteoarthritis, left total hip replacement, who was admitted to [facility] on 07/18/2015 for evaluation of nausea, vomiting, weakness, and dizziness.   1.  Hyponatremia, hypovolemic in nature due to nausea, vomiting, diarrhea, and use of hydrochlorothiazide. 2.  Hypertension. 3. Anemia unspecified.  Plan: The patient presents with hyponatremia.  This appears to be hypovolemic in nature. Patient had nausea, vomiting, diarrhea, and use of hydrochlorothiazide at home. Serum sodium has improved to 124 this a.m. Continue IV fluid hydration with 0.9 normal saline. In addition check serum osmolality, urine osmolality, urine sodium, TSH, SPEP, and UPEP. Continue to monitor serum sodium daily. Avoid hydrochlorothiazide at this time.  We will also check iron studies to more fully evaluate the underlying anemia. Thanks for consultation.

## 2015-07-19 NOTE — Evaluation (Signed)
Physical Therapy Evaluation Patient Details Name: Nancy York MRN: [id number] DOB: 1946/03/18 Today's Date: 07/19/2015   History of Present Illness  Pt underwent L hip hardware removal and posterior approach THR earlier this month.  She was doing well with HHPT but has been very weak the last few days and was found to have low sodium.  Clinical Impression  Pt does well with ambulation showing good speed and confidence, she is able to perform bed mobility w/o assist and other than generally feeling weak she is generally recovering well from total hip 3 weeks ago.     Follow Up Recommendations Home health PT    Equipment Recommendations       Recommendations for Other Services       Precautions / Restrictions Precautions Precautions: Fall (isolation) Restrictions Weight Bearing Restrictions: No      Mobility  Bed Mobility Overal bed mobility: Independent             General bed mobility comments: Pt able to get to EOB w/o use of rails, good confidence  Transfers Overall transfer level: Independent Equipment used: Rolling walker (2 wheeled)             General transfer comment: Pt does not rely heavily on UEs to get up and is able to maintain balance w/o AD  Ambulation/Gait Ambulation/Gait assistance: Supervision Ambulation Distance (Feet): 70 Feet Assistive device: Rolling walker (2 wheeled)       General Gait Details: Pt does not wish to go out into the hallway (though she likely could have) and does report feeling generally very weak  Stairs            Wheelchair Mobility    Modified Rankin (Stroke Patients Only)       Balance Overall balance assessment: Independent                                           Pertinent Vitals/Pain Pain Assessment: No/denies pain    Home Living Family/patient expects to be discharged to:: Private residence Living Arrangements: Spouse/significant other Available Help at Discharge:  Family Type of Home: House Home Access: Stairs to enter Entrance Stairs-Rails: Doctor, general practice of Steps: 2 Home Layout: Two level;Able to live on main level with bedroom/bathroom Home Equipment: Dan Humphreys - 2 wheels;Bedside commode;Cane - single point      Prior Function Level of Independence: Independent         Comments: Pt has been able to do some ambulation with cane during hip replacement rehab.     Hand Dominance   Dominant Hand: Right    Extremity/Trunk Assessment   Upper Extremity Assessment: Overall WFL for tasks assessed           Lower Extremity Assessment: Overall WFL for tasks assessed (able to do SLRs and resisted acts with L LE)         Communication   Communication: No difficulties  Cognition Arousal/Alertness: Awake/alert Behavior During Therapy: WFL for tasks assessed/performed Overall Cognitive Status: Within Functional Limits for tasks assessed                      General Comments      Exercises General Exercises - Lower Extremity Heel Slides: Strengthening;5 reps Hip ABduction/ADduction: Strengthening;5 reps Straight Leg Raises: AROM;10 reps      Assessment/Plan    PT Assessment Patient needs  continued PT services  PT Diagnosis Generalized weakness   PT Problem List Decreased strength;Decreased activity tolerance  PT Treatment Interventions Gait training;Stair training;Therapeutic exercise;Therapeutic activities;Neuromuscular re-education;Patient/family education   PT Goals (Current goals can be found in the Care Plan section) Acute Rehab PT Goals Patient Stated Goal: "Get my strength back" PT Goal Formulation: With patient Time For Goal Achievement: 08/01/15 Potential to Achieve Goals: Good    Frequency Min 2X/week   Barriers to discharge        Co-evaluation               End of Session Equipment Utilized During Treatment: Gait belt Activity Tolerance: Patient tolerated treatment  well;Patient limited by fatigue Patient left: in bed;with call bell/phone within reach;with family/visitor present           Time: [id number] PT Time Calculation (min) (ACUTE ONLY): 22 min   Charges:   PT Evaluation $PT Eval Low Complexity: 1 Procedure     PT G Codes:       Loran SentersGalen Burwell Bethel, PT, DPT [id number]  Malachi ProGalen R Cornelious Diven 07/19/2015, 2:52 PM

## 2015-07-19 NOTE — Progress Notes (Signed)
[facility] [place] at [facility]   PATIENT NAME: Nancy SchaumannSandra York    MR#:  [id number]  DATE OF BIRTH:  29-Aug-1945  SUBJECTIVE:  CHIEF COMPLAINT:  Patient is feeling little better. Nauseous but no vomiting. Daughter at bedside.  REVIEW OF SYSTEMS:  CONSTITUTIONAL: No fever, fatigue or weakness.  EYES: No blurred or double vision.  EARS, NOSE, AND THROAT: No tinnitus or ear pain.  RESPIRATORY: No cough, shortness of breath, wheezing or hemoptysis.  CARDIOVASCULAR: No chest pain, orthopnea, edema.  GASTROINTESTINAL: No nausea, vomiting, diarrhea or abdominal pain.  GENITOURINARY: No dysuria, hematuria.  ENDOCRINE: No polyuria, nocturia,  HEMATOLOGY: No anemia, easy bruising or bleeding SKIN: No rash or lesion. MUSCULOSKELETAL: No joint pain or arthritis.   NEUROLOGIC: No tingling, numbness, weakness.  PSYCHIATRY: No anxiety or depression.   DRUG ALLERGIES:   Allergies  Allergen Reactions  . Statins Other (See Comments)    "leg cramps"  . Penicillins Rash  . Tape Rash    Plastic tape should be better    VITALS:  Blood pressure 124/51, pulse 74, temperature 98.2 F (36.8 C), temperature source Oral, resp. rate 17, height 5\' 2"  (1.575 m), weight 83.825 kg (184 lb 12.8 oz), SpO2 98 %.  PHYSICAL EXAMINATION:  GENERAL:  70 y.o.-year-old patient lying in the bed with no acute distress.  EYES: Pupils equal, round, reactive to light and accommodation. No scleral icterus. Extraocular muscles intact.  HEENT: Head atraumatic, normocephalic. Oropharynx and nasopharynx clear.  NECK:  Supple, no jugular venous distention. No thyroid enlargement, no tenderness.  LUNGS: Normal breath sounds bilaterally, no wheezing, rales,rhonchi or crepitation. No use of accessory muscles of respiration.  CARDIOVASCULAR: S1, S2 normal. No murmurs, rubs, or gallops.  ABDOMEN: Soft, nontender, nondistended. Bowel sounds present. No organomegaly or mass.  EXTREMITIES: No pedal  edema, cyanosis, or clubbing.  NEUROLOGIC: Cranial nerves II through XII are intact. Muscle strength 5/5 in all extremities. Sensation intact. Gait not checked.  PSYCHIATRIC: The patient is alert and oriented x 3.  SKIN: No obvious rash, lesion, or ulcer.    LABORATORY PANEL:   CBC  Recent Labs Lab 07/19/15 0550  WBC 6.1  HGB 8.9*  HCT 26.3*  PLT 254   ------------------------------------------------------------------------------------------------------------------  Chemistries   Recent Labs Lab 07/18/15 1615 07/18/15 2038 07/19/15 0550  NA 121* 121* 124*  K 3.6 3.6 3.7  CL 87* 90* 93*  CO2 23 22 23   GLUCOSE 111* 113* 97  BUN 18 19 15   CREATININE 0.78 0.76 0.68  CALCIUM 8.5* 8.3* 7.9*  MG  --  2.0  --   AST 26  --   --   ALT 14  --   --   ALKPHOS 55  --   --   BILITOT 0.2*  --   --    ------------------------------------------------------------------------------------------------------------------  Cardiac Enzymes  Recent Labs Lab 07/16/15 0114  TROPONINI <0.03   ------------------------------------------------------------------------------------------------------------------  RADIOLOGY:  No results found.  EKG:   Orders placed or performed during the hospital encounter of 07/18/15  . EKG 12-Lead  . EKG 12-Lead  . EKG 12-Lead  . EKG 12-Lead    ASSESSMENT AND PLAN:   Hyponatremia - acute secondary to dehydration from poor by mouth intake and probably from hydrochlorothiazide intake Hold hydrochlorthiazide Continue IV fluids and monitor sodium closely. Sodium is 121-124 If no improvement will consider nephrology consult   Generalized weakness - likely due to her acute hyponatremia. Treat as above, get PT consult for evaluation of  the morning.   Dehydration - hydrate with normal saline as clinically improving   S/P total hip arthroplasty - healing well, not likely contributing to her acute weakness outpatient follow-up with Dr. Ernest Pine is  recommended   HTN (hypertension) - patient states that she has been on hydrochlorothiazide for a long time, we will hold this as I suspect it may also have contributed to her hyponatremia along with her diarrhea, vomiting, and lactulose use. Continue her other antihypertensives   GERD (gastroesophageal reflux disease) - home dose PPI     All the records are reviewed and case discussed with Care Management/Social Workerr. Management plans discussed with the patient, family and they are in agreement.  CODE STATUS: fc TOTAL TIME TAKING CARE OF THIS PATIENT: 35  minutes.   POSSIBLE D/C IN 2 DAYS, DEPENDING ON CLINICAL CONDITION.   Ramonita Lab M.D on 07/19/2015 at 12:40 PM  Between 7am to 6pm - Pager - [phone] After 6pm go to [email or web address] - password [email or web address] [facility]  [place]  [facility]  Office  [phone]  CC: Primary care physician; Marguarite Arbour, MD

## 2015-07-20 LAB — CBC
HCT: 27.9 % — ABNORMAL LOW (ref 35.0–47.0)
Hemoglobin: 9.4 g/dL — ABNORMAL LOW (ref 12.0–16.0)
MCH: 27.4 pg (ref 26.0–34.0)
MCHC: 33.8 g/dL (ref 32.0–36.0)
MCV: 81.2 fL (ref 80.0–100.0)
PLATELETS: 254 10*3/uL (ref 150–440)
RBC: 3.44 MIL/uL — ABNORMAL LOW (ref 3.80–5.20)
RDW: 15.5 % — AB (ref 11.5–14.5)
WBC: 5.5 10*3/uL (ref 3.6–11.0)

## 2015-07-20 LAB — PROTEIN ELECTROPHORESIS, SERUM
A/G Ratio: 1.1 (ref 0.7–1.7)
ALPHA-1-GLOBULIN: 0.2 g/dL (ref 0.0–0.4)
ALPHA-2-GLOBULIN: 0.8 g/dL (ref 0.4–1.0)
Albumin ELP: 3 g/dL (ref 2.9–4.4)
Beta Globulin: 0.9 g/dL (ref 0.7–1.3)
GAMMA GLOBULIN: 0.8 g/dL (ref 0.4–1.8)
Globulin, Total: 2.7 g/dL (ref 2.2–3.9)
TOTAL PROTEIN ELP: 5.7 g/dL — AB (ref 6.0–8.5)

## 2015-07-20 LAB — BASIC METABOLIC PANEL
Anion gap: 4 — ABNORMAL LOW (ref 5–15)
BUN: 12 mg/dL (ref 6–20)
CHLORIDE: 101 mmol/L (ref 101–111)
CO2: 24 mmol/L (ref 22–32)
CREATININE: 0.51 mg/dL (ref 0.44–1.00)
Calcium: 8.2 mg/dL — ABNORMAL LOW (ref 8.9–10.3)
GFR calc Af Amer: >60 mL/min (ref >60)
GFR calc non Af Amer: >60 mL/min (ref >60)
Glucose, Bld: 98 mg/dL (ref 65–99)
Potassium: 3.8 mmol/L (ref 3.5–5.1)
SODIUM: 129 mmol/L — AB (ref 135–145)

## 2015-07-20 LAB — MRSA PCR SCREENING: MRSA by PCR: NEGATIVE

## 2015-07-20 MED ORDER — VITAMIN B-12 1000 MCG PO TABS
1000.0000 ug | ORAL_TABLET | Freq: Every day | ORAL | Status: DC
  Administered 2015-07-20 – 2015-07-21 (×2): 1000 ug via ORAL
  Filled 2015-07-20 (×2): qty 1

## 2015-07-20 MED ORDER — DIPHENHYDRAMINE HCL 25 MG PO CAPS
25.0000 mg | ORAL_CAPSULE | Freq: Three times a day (TID) | ORAL | Status: DC | PRN
  Administered 2015-07-20 – 2015-07-21 (×2): 25 mg via ORAL
  Filled 2015-07-20 (×2): qty 1

## 2015-07-20 NOTE — Progress Notes (Signed)
Patient asking for Benadryl for her sinus/allergies.  Dr. Amado CoeGouru gave verbal order.

## 2015-07-20 NOTE — Progress Notes (Signed)
Talked to Dr. Amado CoeGouru about patient waking up this morning and her face being numb, the patient mentioned that she was not taking her vitamin b12.  Dr. Amado CoeGouru gave order to restart the vitamin b12.

## 2015-07-20 NOTE — Progress Notes (Signed)
[facility] -  at [facility]   PATIENT NAME: Nancy York    MR#:  [id number]  DATE OF BIRTH:  April 16, 1946  SUBJECTIVE:  CHIEF COMPLAINT:  Patient is feeling  better but reporting facial numbness. Denies nausea or vomiting. Denies any dysarthria or extremity weakness  REVIEW OF SYSTEMS:  CONSTITUTIONAL: No fever, fatigue or weakness.  EYES: No blurred or double vision.  EARS, NOSE, AND THROAT: No tinnitus or ear pain.  RESPIRATORY: No cough, shortness of breath, wheezing or hemoptysis.  CARDIOVASCULAR: No chest pain, orthopnea, edema.  GASTROINTESTINAL: No nausea, vomiting, diarrhea or abdominal pain.  GENITOURINARY: No dysuria, hematuria.  ENDOCRINE: No polyuria, nocturia,  HEMATOLOGY: No anemia, easy bruising or bleeding SKIN: No rash or lesion. MUSCULOSKELETAL: No joint pain or arthritis.   NEUROLOGIC: No tingling, weakness. Reporting facial numbness in the forehead area PSYCHIATRY: No anxiety or depression.   DRUG ALLERGIES:   Allergies  Allergen Reactions  . Statins Other (See Comments)    "leg cramps"  . Penicillins Rash  . Tape Rash    Plastic tape should be better    VITALS:  Blood pressure 160/67, pulse 83, temperature 97.7 F (36.5 C), temperature source Oral, resp. rate 18, height  (1.575 m), weight 83.825 kg (184 lb 12.8 oz), SpO2 99 %.  PHYSICAL EXAMINATION:  GENERAL:  70 y.o.-year-old patient lying in the bed with no acute distress.  EYES: Pupils equal, round, reactive to light and accommodation. No scleral icterus. Extraocular muscles intact.  HEENT: Head atraumatic, normocephalic. Oropharynx and nasopharynx clear.  NECK:  Supple, no jugular venous distention. No thyroid enlargement, no tenderness.  LUNGS: Normal breath sounds bilaterally, no wheezing, rales,rhonchi or crepitation. No use of accessory muscles of respiration.  CARDIOVASCULAR: S1, S2 normal. No murmurs, rubs, or gallops.  ABDOMEN: Soft, nontender,  nondistended. Bowel sounds present. No organomegaly or mass.  EXTREMITIES: No pedal edema, cyanosis, or clubbing.  NEUROLOGIC: Cranial nerves II through XII are intact. Muscle strength 5/5 in all extremities. Sensation intact except facial numbness in the forehead area Gait not checked.  PSYCHIATRIC: The patient is alert and oriented x 3.  SKIN: No obvious rash, lesion, or ulcer.    LABORATORY PANEL:   CBC  Recent Labs Lab 07/20/15 0447  WBC 5.5  HGB 9.4*  HCT 27.9*  PLT 254   ------------------------------------------------------------------------------------------------------------------  Chemistries   Recent Labs Lab 07/18/15 1615 07/18/15 2038  07/20/15 0447  NA 121* 121*  < > 129*  K 3.6 3.6  < > 3.8  CL 87* 90*  < > 101  CO2 23 22  < > 24  GLUCOSE 111* 113*  < > 98  BUN 18 19  < > 12  CREATININE 0.78 0.76  < > 0.51  CALCIUM 8.5* 8.3*  < > 8.2*  MG  --  2.0  --   --   AST 26  --   --   --   ALT 14  --   --   --   ALKPHOS 55  --   --   --   BILITOT 0.2*  --   --   --   < > = values in this interval not displayed. ------------------------------------------------------------------------------------------------------------------  Cardiac Enzymes  Recent Labs Lab 07/16/15 0114  TROPONINI <0.03   ------------------------------------------------------------------------------------------------------------------  RADIOLOGY:  No results found.  EKG:   Orders placed or performed during the hospital encounter of 07/18/15  . EKG 12-Lead  . EKG 12-Lead  . EKG 12-Lead  .  EKG 12-Lead    ASSESSMENT AND PLAN:   Hyponatremia - acute secondary to dehydration from poor by mouth intake and probably from hydrochlorothiazide intake, dc  hydrochlorthiazide Continue IV fluids and monitor sodium closely. Sodium is 121-124--129   Facial numbness in the forehead area-nephrology is recommending to reduce the fluid rate at 50 mL per hour, as rapid correction of sodium  might be the etiology of tingling and numbness  Gen weakness - PT consult is recommending home health PT   Dehydration - hydrate with normal saline as clinically improving   S/P total hip arthroplasty - healing well, not likely contributing to her acute weakness outpatient follow-up with Dr. Ernest PineHooten is recommended   HTN (hypertension) - patient states that she has been on hydrochlorothiazide for a long time, we will hold this as I suspect it may also have contributed to her hyponatremia along with her diarrhea, vomiting, and lactulose use. Continue her other antihypertensives   GERD (gastroesophageal reflux disease) - home dose PPI     All the records are reviewed and case discussed with Care Management/Social Workerr. Management plans discussed with the patient, family and they are in agreement.  CODE STATUS: fc TOTAL TIME TAKING CARE OF THIS PATIENT: 33  minutes.   POSSIBLE D/C IN 2 DAYS, DEPENDING ON CLINICAL CONDITION.   Ramonita LabGouru, Aqil Goetting M.D on 07/20/2015 at 2:44 PM  Between 7am to 6pm - Pager - [phone] After 6pm go to [email or web address] - password [email or web address] [facility]  [facility]  Office  [phone]  CC: Primary care physician; Marguarite ArbourSPARKS,JEFFREY D, MD

## 2015-07-20 NOTE — Care Management Note (Signed)
Case Management Note  Patient Details  Name: Nancy BlakeSandra S York MRN: [id number] Date of Birth: [birth year]  Subjective/Objective:                 Patient presents with hyponatremia.  Patient lives at home with her husband.  Patient obtains her medications from [facility].  Patient has a RW, cane, and 4 prong cane in the home.   [facility]  Will need resumption of home health orders at time of discharge.    Action/Plan: RNCM followig for discharge planning  Expected Discharge Date:                  Expected Discharge Plan:     In-House Referral:     Discharge planning Services     Post Acute Care Choice:    Choice offered to:     DME Arranged:    DME Agency:     HH Arranged:    HH Agency:     Status of Service:     Medicare Important Message Given:    Date Medicare IM Given:    Medicare IM give by:    Date Additional Medicare IM Given:    Additional Medicare Important Message give by:     If discussed at Long Length of Stay Meetings, dates discussed:    Additional Comments:  Chapman FitchBOWEN, Mang Hazelrigg T, RN 07/20/2015, 2:02 PM

## 2015-07-20 NOTE — Progress Notes (Signed)
[facility]  ROUNDING NOTE   Subjective:   Family at bedside. Complains of facial numbness.   Na 129  Objective:  Vital signs in last 24 hours:  Temp:  [97.7 F (36.5 C)-98.4 F (36.9 C)] 97.7 F (36.5 C) (03/27 1248) Pulse Rate:  [73-83] 83 (03/27 1248) Resp:  [18-20] 18 (03/27 1248) BP: (129-160)/(52-67) 160/67 mmHg (03/27 1248) SpO2:  [97 %-99 %] 99 % (03/27 1248)  Weight change:  Filed Weights   07/18/15 1611 07/18/15 2206  Weight: 82.555 kg (182 lb) 83.825 kg (184 lb 12.8 oz)    Intake/Output: I/O last 3 completed shifts: In: 3540 [P.O.:600; I.V.:2940] Out: 1950 [Urine:1950]   Intake/Output this shift:  Total I/O In: 495 [P.O.:240; I.V.:255] Out: 150 [Urine:150]  Physical Exam: General: NAD, sitting in chair  Head: Normocephalic, atraumatic. Moist oral mucosal membranes  Eyes: Anicteric, PERRL  Neck: Supple, trachea midline  Lungs:  Clear to auscultation  Heart: Regular rate and rhythm  Abdomen:  Soft, nontender,   Extremities: no peripheral edema.  Neurologic: Nonfocal, moving all four extremities, normal sensation of face bilaterally  Skin: No lesions       Basic Metabolic Panel:  Recent Labs Lab 07/16/15 0114 07/18/15 1615 07/18/15 2038 07/19/15 0550 07/20/15 0447  NA 136 121* 121* 124* 129*  K 3.9 3.6 3.6 3.7 3.8  CL 101 87* 90* 93* 101  CO2 22 23 22 23 24   GLUCOSE 116* 111* 113* 97 98  BUN 18 18 19 15 12   CREATININE 1.03* 0.78 0.76 0.68 0.51  CALCIUM 9.4 8.5* 8.3* 7.9* 8.2*  MG  --   --  2.0  --   --   PHOS  --   --  3.7  --   --     Liver Function Tests:  Recent Labs Lab 07/16/15 0114 07/18/15 1615  AST 27 26  ALT 12* 14  ALKPHOS 71 55  BILITOT 0.3 0.2*  PROT 7.3 6.9  ALBUMIN 3.9 3.6    Recent Labs Lab 07/16/15 0114  LIPASE 36   No results for input(s): AMMONIA in the last 168 hours.  CBC:  Recent Labs Lab 07/16/15 0114 07/18/15 1615 07/19/15 0550 07/20/15 0447  WBC 6.6 8.1 6.1 5.5  NEUTROABS  3.0  --   --   --   HGB 10.3* 9.5* 8.9* 9.4*  HCT 31.1* 27.9* 26.3* 27.9*  MCV 82.9 80.4 80.4 81.2  PLT 391 328 254 254    Cardiac Enzymes:  Recent Labs Lab 07/16/15 0114  TROPONINI <0.03    BNP: Invalid input(s): POCBNP  CBG: No results for input(s): GLUCAP in the last 168 hours.  Microbiology: Results for orders placed or performed during the hospital encounter of 07/18/15  MRSA PCR Screening     Status: None   Collection Time: 07/20/15 11:49 AM  Result Value Ref Range Status   MRSA by PCR NEGATIVE NEGATIVE Final    Comment:        The GeneXpert MRSA Assay (FDA approved for NASAL specimens only), is one component of a comprehensive MRSA colonization surveillance program. It is not intended to diagnose MRSA infection nor to guide or monitor treatment for MRSA infections.     Coagulation Studies: No results for input(s): LABPROT, INR in the last 72 hours.  Urinalysis:  Recent Labs  07/18/15 1918  COLORURINE YELLOW*  LABSPEC 1.020  PHURINE 6.0  GLUCOSEU NEGATIVE  HGBUR NEGATIVE  BILIRUBINUR NEGATIVE  KETONESUR NEGATIVE  PROTEINUR 30*  NITRITE NEGATIVE  LEUKOCYTESUR NEGATIVE      Imaging: No results found.   Medications:   . sodium chloride 100 mL/hr at 07/20/15 0618   . aspirin EC  81 mg Oral Daily  . enoxaparin (LOVENOX) injection  40 mg Subcutaneous Q24H  . metoprolol succinate  25 mg Oral Daily  . pantoprazole  40 mg Oral Daily  . vitamin B-12  1,000 mcg Oral Daily   acetaminophen **OR** acetaminophen, ondansetron **OR** ondansetron (ZOFRAN) IV, oxyCODONE  Assessment/ Plan:  [name] is a 69 y.o. white female with Hypertension, GERD, diverticulosis, osteoarthritis, left total hip replacement, who was admitted to [facility] on 07/18/2015  1. Hyponatremia, hypovolemic in nature due to nausea, vomiting, diarrhea, and use of hydrochlorothiazide. - discontinued hydrochlorothiazide.  - Continue NS, decrease rate to 33mL/hr -  encourage PO intake  2. Hypertension: some elevations. Off hydrochlorothiazide.  - metoprolol  3. Anemia: hemoglobin 9.4  4. Weakness and now numbness: may need neurology consult   LOS: 1 Nancy York 3/27/20171:40 PM

## 2015-07-20 NOTE — Progress Notes (Signed)
[facility]  Patient Status: Active  [facility] is providing the following services: PT  If patient discharges after hours, please call [phone].   Dimple CaseyJason E Hinton 07/20/2015, 10:57 AM

## 2015-07-21 ENCOUNTER — Encounter: Payer: Self-pay | Admitting: Radiology

## 2015-07-21 ENCOUNTER — Inpatient Hospital Stay: Payer: Commercial Managed Care - HMO

## 2015-07-21 DIAGNOSIS — R202 Paresthesia of skin: Secondary | ICD-10-CM

## 2015-07-21 LAB — PROTEIN ELECTRO, RANDOM URINE
ALBUMIN ELP UR: 31.5 %
ALPHA-1-GLOBULIN, U: 2.9 %
ALPHA-2-GLOBULIN, U: 17.1 %
BETA GLOBULIN, U: 22.1 %
Gamma Globulin, U: 26.4 %
Total Protein, Urine: 22.9 mg/dL

## 2015-07-21 LAB — BASIC METABOLIC PANEL
ANION GAP: 4 — AB (ref 5–15)
BUN: 8 mg/dL (ref 6–20)
CHLORIDE: 102 mmol/L (ref 101–111)
CO2: 26 mmol/L (ref 22–32)
Calcium: 8.5 mg/dL — ABNORMAL LOW (ref 8.9–10.3)
Creatinine, Ser: 0.49 mg/dL (ref 0.44–1.00)
GFR calc Af Amer: >60 mL/min (ref >60)
GFR calc non Af Amer: >60 mL/min (ref >60)
GLUCOSE: 100 mg/dL — AB (ref 65–99)
POTASSIUM: 3.7 mmol/L (ref 3.5–5.1)
Sodium: 132 mmol/L — ABNORMAL LOW (ref 135–145)

## 2015-07-21 LAB — VITAMIN B12: Vitamin B-12: 1044 pg/mL — ABNORMAL HIGH (ref 180–914)

## 2015-07-21 MED ORDER — IOPAMIDOL (ISOVUE-300) INJECTION 61%
75.0000 mL | Freq: Once | INTRAVENOUS | Status: AC | PRN
  Administered 2015-07-21: 75 mL via INTRAVENOUS

## 2015-07-21 MED ORDER — METOPROLOL TARTRATE 25 MG PO TABS
25.0000 mg | ORAL_TABLET | Freq: Once | ORAL | Status: AC
  Administered 2015-07-21: 25 mg via ORAL
  Filled 2015-07-21: qty 1

## 2015-07-21 MED ORDER — DIPHENHYDRAMINE HCL 25 MG PO CAPS: 25.0000 mg | ORAL_CAPSULE | Freq: Three times a day (TID) | ORAL | Status: DC | PRN

## 2015-07-21 MED ORDER — FLUNISOLIDE 25 MCG/ACT (0.025%) NA SOLN: 2.0000 | Freq: Two times a day (BID) | NASAL | Status: DC

## 2015-07-21 MED ORDER — ACETAMINOPHEN 325 MG PO TABS: 325.0000 mg | ORAL_TABLET | Freq: Four times a day (QID) | ORAL | Status: DC | PRN

## 2015-07-21 MED ORDER — METOPROLOL SUCCINATE ER 50 MG PO TB24: 50.0000 mg | ORAL_TABLET | Freq: Every day | ORAL | Status: DC

## 2015-07-21 MED ORDER — CETIRIZINE HCL 10 MG PO TABS: 10.0000 mg | ORAL_TABLET | Freq: Every day | ORAL | Status: DC

## 2015-07-21 NOTE — Care Management Important Message (Signed)
Important Message  Patient Details  Name: Nancy York MRN: [id number] Date of Birth: [birth year]   Medicare Important Message Given:  Yes    Chapman FitchBOWEN, Sumeet Geter T, RN 07/21/2015, 10:30 AM

## 2015-07-21 NOTE — Progress Notes (Signed)
IV removed, Rx given, patient discharged to home with husband.

## 2015-07-21 NOTE — Care Management (Signed)
Patient to discharge home today.  Resumption of home health orders for Pt placed. I have [facility]Jason [facility].  RNCM signing off.

## 2015-07-21 NOTE — Discharge Summary (Signed)
[facility]   PATIENT NAME: Nancy SchaumannSandra Osier    MR#:  [id number]  DATE OF BIRTH:  11/17/1945  DATE OF ADMISSION:  07/18/2015 ADMITTING PHYSICIAN: Oralia Manisavid Willis, MD  DATE OF DISCHARGE: 07/21/15 PRIMARY CARE PHYSICIAN: SPARKS,JEFFREY D, MD    ADMISSION DIAGNOSIS:  Hyponatremia [E87.1] Generalized weakness [R53.1]  DISCHARGE DIAGNOSIS:  Hyponatremia Depression Allergic rhinitis    SECONDARY DIAGNOSIS:   Past Medical History  Diagnosis Date  . Hypertension   . GERD (gastroesophageal reflux disease)   . Diverticulosis   . Heart murmur     no doctor follows her for this  . Arthritis     joints, shoulder and knees    HOSPITAL COURSE:   Hyponatremia - acute secondary to dehydration from poor by mouth intake and probably from hydrochlorothiazide intake, dc hydrochlorthiazide Improved with IV fluids  Sodium is 121-124--129-132 Outpatient follow-up with nephrology as recommended   Facial numbness with stuffiness-most likely from allergic rhinitis Naserel nasal steroid inhaler and Zyrtec. Follow up with ENT as an outpatient as needed Seen by neurology, symptoms not suggestive of neurological etiology TSH, magnesium and phosphorus are normal    Gen weakness - PT consult is recommending home health PT   Dehydration - hydrated with normal saline , clinically improved   S/P total hip arthroplasty - healing well, not likely contributing to her acute weakness outpatient follow-up with Dr. Ernest PineHooten is recommended   HTN (hypertension) - patient states that she has been on hydrochlorothiazide for a long time, we will d/c this as I suspect it may also have contributed to her hyponatremia along with her diarrhea, vomiting, and lactulose use. Continue her other antihypertensives Toprol-XL dose increased to 50 mg by mouth once daily   GERD (gastroesophageal reflux disease) - home dose PPI   DISCHARGE CONDITIONS:   Fair  CONSULTS  OBTAINED:  Treatment Team:  Kym GroomNeuro1 [facility], MD   PROCEDURES NONE   DRUG ALLERGIES:   Allergies  Allergen Reactions  . Statins Other (See Comments)    "leg cramps"  . Penicillins Rash  . Tape Rash    Plastic tape should be better    DISCHARGE MEDICATIONS:   Current Discharge Medication List    START taking these medications   Details  acetaminophen (TYLENOL) 325 MG tablet Take 1 tablet (325 mg total) by mouth every 6 (six) hours as needed for mild pain (or Fever >/= 101).    diphenhydrAMINE (BENADRYL) 25 mg capsule Take 1 capsule (25 mg total) by mouth every 8 (eight) hours as needed for itching or allergies. Qty: 30 capsule, Refills: 0    flunisolide (NASALIDE) 25 MCG/ACT (0.025%) SOLN Place 2 sprays into the nose 2 (two) times daily. Qty: 1 Bottle, Refills: 0      CONTINUE these medications which have CHANGED   Details  metoprolol succinate (TOPROL XL) 50 MG 24 hr tablet Take 1 tablet (50 mg total) by mouth daily. Take with or immediately following a meal. Qty: 30 tablet, Refills: 0      CONTINUE these medications which have NOT CHANGED   Details  aspirin EC 81 MG tablet Take 81 mg by mouth daily.    calcium citrate-vitamin D (CITRACAL+D) 315-200 MG-UNIT per tablet Take 1 tablet by mouth 3 (three) times daily.     Flaxseed, Linseed, (FLAX SEED OIL) 1000 MG CAPS Take 1 capsule by mouth 3 (three) times daily.    Ginkgo Biloba 120 MG CAPS Take 120 mg by mouth daily.  Inulin (FIBER CHOICE FRUITY BITES PO) Take 1 Dose by mouth once.    loratadine (CLARITIN) 10 MG tablet Take 10 mg by mouth daily.    meclizine (ANTIVERT) 25 MG tablet Take 25 mg by mouth 3 (three) times daily as needed for dizziness.    oxyCODONE (OXY IR/ROXICODONE) 5 MG immediate release tablet Take 1-2 tablets (5-10 mg total) by mouth every 4 (four) hours as needed for severe pain. Qty: 80 tablet, Refills: 0    pantoprazole (PROTONIX) 40 MG tablet Take 40 mg by mouth daily.    Plant  Sterols and Stanols (CHOLESTOFF PO) Take 2 capsules by mouth 2 (two) times daily.    traMADol (ULTRAM) 50 MG tablet Take 1-2 tablets (50-100 mg total) by mouth every 4 (four) hours as needed for moderate pain. Qty: 60 tablet, Refills: 1    vitamin B-12 (CYANOCOBALAMIN) 500 MCG tablet Take 1,000 mcg by mouth daily.       STOP taking these medications     hydrochlorothiazide (HYDRODIURIL) 25 MG tablet          DISCHARGE INSTRUCTIONS:   Activity as tolerated with home health PT Diet low-salt Follow-up with primary care physician in a week Follow-up with nephrology in 2 weeks   DIET:  Low-salt  DISCHARGE CONDITION:  Fair  ACTIVITY:  Activity as tolerated  OXYGEN:  Home Oxygen: No.   Oxygen Delivery: room air  DISCHARGE LOCATION:  home with home PT  If you experience worsening of your admission symptoms, develop shortness of breath, life threatening emergency, suicidal or homicidal thoughts you must seek medical attention immediately by calling 911 or calling your MD immediately  if symptoms less severe.  You Must read complete instructions/literature along with all the possible adverse reactions/side effects for all the Medicines you take and that have been prescribed to you. Take any new Medicines after you have completely understood and accpet all the possible adverse reactions/side effects.   Please note  You were cared for by a hospitalist during your hospital stay. If you have any questions about your discharge medications or the care you received while you were in the hospital after you are discharged, you can call the unit and asked to speak with the hospitalist on call if the hospitalist that took care of you is not available. Once you are discharged, your primary care physician will handle any further medical issues. Please note that NO REFILLS for any discharge medications will be authorized once you are discharged, as it is imperative that you return to your  primary care physician (or establish a relationship with a primary care physician if you do not have one) for your aftercare needs so that they can reassess your need for medications and monitor your lab values.     Today  Chief Complaint  Patient presents with  . Nausea  . Emesis  . Weakness  . Dizziness   The patient is feeling fine. Reporting facial stuffiness, Intermittent paresthesias. Discussed with neurology, not neurological etiology. CT sinuses with no acute sinusitis. Patient reports she has allergies which is most probable etiology for facial fullness otherwise feeling fine and wants to go home  ROS:  CONSTITUTIONAL: Denies fevers, chills. Denies any fatigue, weakness.  EYES: Denies blurry vision, double vision, eye pain. EARS, NOSE, THROAT: Denies tinnitus, ear pain, hearing loss. Facial fullness is reported RESPIRATORY: Denies cough, wheeze, shortness of breath.  CARDIOVASCULAR: Denies chest pain, palpitations, edema.  GASTROINTESTINAL: Denies nausea, vomiting, diarrhea, abdominal pain. Denies bright red  blood per rectum. GENITOURINARY: Denies dysuria, hematuria. ENDOCRINE: Denies nocturia or thyroid problems. HEMATOLOGIC AND LYMPHATIC: Denies easy bruising or bleeding. SKIN: Denies rash or lesion. MUSCULOSKELETAL: Denies pain in neck, back, shoulder, knees, hips or arthritic symptoms.  NEUROLOGIC: Denies paralysis, paresthesias.  PSYCHIATRIC: Denies anxiety or depressive symptoms.   VITAL SIGNS:  Blood pressure 167/66, pulse 87, temperature 98 F (36.7 C), temperature source Oral, resp. rate 17, height  (1.575 m), weight 83.825 kg (184 lb 12.8 oz), SpO2 95 %.  I/O:    Intake/Output Summary (Last 24 hours) at 07/21/15 1414 Last data filed at 07/21/15 1121  Gross per 24 hour  Intake   1393 ml  Output   1250 ml  Net    143 ml    PHYSICAL EXAMINATION:  GENERAL:  70 y.o.-year-old patient lying in the bed with no acute distress.  EYES: Pupils equal, round,  reactive to light and accommodation. No scleral icterus. Extraocular muscles intact.  HEENT: Head atraumatic, normocephalic. Oropharynx and nasopharynx clear. Maxillary and frontal area is dull on palpation NECK:  Supple, no jugular venous distention. No thyroid enlargement, no tenderness.  LUNGS: Normal breath sounds bilaterally, no wheezing, rales,rhonchi or crepitation. No use of accessory muscles of respiration.  CARDIOVASCULAR: S1, S2 normal. No murmurs, rubs, or gallops.  ABDOMEN: Soft, non-tender, non-distended. Bowel sounds present. No organomegaly or mass.  EXTREMITIES: No pedal edema, cyanosis, or clubbing.  NEUROLOGIC: Cranial nerves II through XII are intact. Muscle strength 5/5 in all extremities. Sensation intact. Gait not checked.  PSYCHIATRIC: The patient is alert and oriented x 3.  SKIN: No obvious rash, lesion, or ulcer.   DATA REVIEW:   CBC  Recent Labs Lab 07/20/15 0447  WBC 5.5  HGB 9.4*  HCT 27.9*  PLT 254    Chemistries   Recent Labs Lab 07/18/15 1615 07/18/15 2038  07/21/15 0504  NA 121* 121*  < > 132*  K 3.6 3.6  < > 3.7  CL 87* 90*  < > 102  CO2 23 22  < > 26  GLUCOSE 111* 113*  < > 100*  BUN 18 19  < > 8  CREATININE 0.78 0.76  < > 0.49  CALCIUM 8.5* 8.3*  < > 8.5*  MG  --  2.0  --   --   AST 26  --   --   --   ALT 14  --   --   --   ALKPHOS 55  --   --   --   BILITOT 0.2*  --   --   --   < > = values in this interval not displayed.  Cardiac Enzymes  Recent Labs Lab 07/16/15 0114  TROPONINI <0.03    Microbiology Results  Results for orders placed or performed during the hospital encounter of 07/18/15  MRSA PCR Screening     Status: None   Collection Time: 07/20/15 11:49 AM  Result Value Ref Range Status   MRSA by PCR NEGATIVE NEGATIVE Final    Comment:        The GeneXpert MRSA Assay (FDA approved for NASAL specimens only), is one component of a comprehensive MRSA colonization surveillance program. It is not intended to  diagnose MRSA infection nor to guide or monitor treatment for MRSA infections.     RADIOLOGY:  Ct Soft Tissue Neck W Contrast  07/21/2015  CLINICAL DATA:  70 year old hypertensive female presenting with paresthesias entire head. Tenderness around eyes and jaw. Stuffiness and with some  of nose bleeding upon attempting blowing nose. Sore throat when swallowing for 5 days. Initial encounter. EXAM: CT NECK WITH CONTRAST TECHNIQUE: Multidetector CT imaging of the neck was performed using the standard protocol following the bolus administration of intravenous contrast. CONTRAST:  75mL ISOVUE-300 IOPAMIDOL (ISOVUE-300) INJECTION 61% COMPARISON:  None. FINDINGS: Pharynx and larynx: Slight asymmetry of vallecula with fullness on the left without mass identified. Salivary glands: Left parotid gland is smaller and slightly dense compared to the right which may reflect result of prior inflammation. No mass identified. Thyroid: No mass noted. Lymph nodes: Scattered normal/top-normal size lymph nodes most prominent right supraclavicular region with short axis dimension less than 1 cm. Asymmetric lymph nodes anterior to the submandibular gland larger on the right. This lymph node does not appear necrotic. Slightly rounded normal size lymph node left level 2 region without necrosis. Overall, lymph nodes do not appear pathologic. Vascular: Calcified plaque aortic arch, origin great vessels and carotid bifurcation narrowing of the carotid bifurcation and proximal internal carotid artery greater on the left (less than 70% diameter stenosis). Limited intracranial: Negative. Visualized orbits: Partially imaged and unremarkable. Mastoids and visualized paranasal sinuses: Clear. 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 Various degrees of spinal stenosis and foraminal narrowing. Upper chest: Visualized upper lung zones are clear. IMPRESSION: Slight asymmetry of vallecula with fullness on the left  without mass identified. Left parotid gland is smaller and slightly dense compared to the right which may reflect result of prior inflammation. Scattered normal/top-normal size lymph nodes. Overall, lymph nodes do not appear pathologic. Calcified plaque carotid bifurcation and proximal internal carotid artery greater on the left (less than 70% diameter stenosis). 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 Electronically Signed   By: Lacy Duverney M.D.   On: 07/21/2015 13:06    EKG:   Orders placed or performed during the hospital encounter of 07/18/15  . EKG 12-Lead  . EKG 12-Lead  . EKG 12-Lead  . EKG 12-Lead      Management plans discussed with the patient, family and they are in agreement.  CODE STATUS:     Code Status Orders        Start     Ordered   07/18/15 2242  Full code   Continuous     07/18/15 2241    Code Status History    Date Active Date Inactive Code Status Order ID Comments User Context   08/25/2014  7:10 PM 08/28/2014  3:08 PM Full Code [id number]  Donato Heinz, MD Inpatient   08/25/2014  7:10 PM 08/25/2014  7:10 PM Full Code [id number]  Donato Heinz, MD Inpatient    Advance Directive Documentation        Most Recent Value   Type of Advance Directive  Living will, Healthcare Power of Attorney   Pre-existing out of facility DNR order (yellow form or pink MOST form)     "MOST" Form in Place?        TOTAL TIME TAKING CARE OF THIS PATIENT: 45  minutes.    @[facility] @  on 07/21/2015 at 2:14 PM  Between 7am to 6pm - Pager - [phone]  After 6pm go to [email or web address] - password [email or web address]  [facility]  Office  [phone]  CC: Primary care physician; Marguarite Arbour, MD

## 2015-07-21 NOTE — Progress Notes (Signed)
Physical Therapy Treatment Patient Details Name: Nancy BlakeSandra S [place] MRN: [id number] DOB: October 12, 1945 Today's Date: 07/21/2015    History of Present Illness Pt underwent L hip hardware removal and posterior approach THR earlier this month.  She was doing well with HHPT but has been very weak the last few days and was found to have low sodium.    PT Comments    Pt reports feeling better, with mild Bilateral lower extremity weakness noted. Pt ambulates safely (250 ft) with rolling walker without loss of balance and demonstrates safe ability to ascend/descend steps. Pt eager for discharge and is appropriate to discharge home and continue with home health PT from a physical therapy standpoint. Complete current PT orders.   Follow Up Recommendations  Home health PT     Equipment Recommendations       Recommendations for Other Services       Precautions / Restrictions Precautions Precautions: Fall Restrictions Weight Bearing Restrictions: No    Mobility  Bed Mobility               General bed mobility comments: Not tested; up in chair  Transfers Overall transfer level: Modified independent Equipment used: Rolling walker (2 wheeled)             General transfer comment: STS with safe use of hands   Ambulation/Gait Ambulation/Gait assistance: Supervision Ambulation Distance (Feet): 250 Feet Assistive device: Rolling walker (2 wheeled) Gait Pattern/deviations: WFL(Within Functional Limits)   Gait velocity interpretation: Below normal speed for age/gender (2.5 ft/second) General Gait Details: Continues to subjectively notes some LE weakness, but ambulates with good fluid speed withiout LOB. Minor cue for avoiding L toe in, which pt able to correct upone cueing   Stairs Stairs: Yes Stairs assistance: Min guard Stair Management: One rail Left;Step to pattern Number of Stairs: 4 General stair comments: Pt notes weak feeling BLEs; performs well with no overt  weakness/buckling or extensive effort noted  Wheelchair Mobility    Modified Rankin (Stroke Patients Only)       Balance Overall balance assessment: No apparent balance deficits (not formally assessed)                                  Cognition Arousal/Alertness: Awake/alert Behavior During Therapy: WFL for tasks assessed/performed Overall Cognitive Status: Within Functional Limits for tasks assessed                      Exercises      General Comments        Pertinent Vitals/Pain Pain Assessment: No/denies pain    Home Living                      Prior Function            PT Goals (current goals can now be found in the care plan section) Progress towards PT goals: Progressing toward goals    Frequency  Min 2X/week    PT Plan Current plan remains appropriate    Co-evaluation             End of Session Equipment Utilized During Treatment: Gait belt Activity Tolerance: Patient tolerated treatment well Patient left: Other (comment) (sitting edge of bed awaiting spouse for discharge)     Time: [id number] PT Time Calculation (min) (ACUTE ONLY): 13 min  Charges:  $Gait Training: 8-22 mins  G CodesKristeen Miss, [place] 07/21/2015, 2:43 PM

## 2015-07-21 NOTE — Consult Note (Signed)
Reason for Consult:Facial paresthesias  Referring Physician: Gouru  CC: Facial paresthesias  HPI: Nancy York is an 70 y.o. female who reports that since admission she has developed paresthesias involving her entire head.  She reports everything above her neck is numb.  This includes her scalp and her ears.  She has tenderness around her eyes and in her jaws.  She feels she is stuffy but can not blow her nose.  She has had some nose bleeding on attempting to blow her nose.  She feels some throat soreness with swallowing as well.    Past Medical History  Diagnosis Date  . Hypertension   . GERD (gastroesophageal reflux disease)   . Diverticulosis   . Heart murmur     no doctor follows her for this  . Arthritis     joints, shoulder and knees    Past Surgical History  Procedure Laterality Date  . Tonsillectomy    . Dilation and curettage of uterus    . Rotator cuff repair Right 2006    Dr. Gerrit Heck   . Knee arthroscopy Left   . Bilateral carpal tunnel release Bilateral   . 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 Reita Chard  . Fracture surgery Left 2007    fractured left  hip  . Joint replacement Right 08/2014    total knee replacement  . Total hip arthroplasty Left 06/24/2015    Procedure: TOTAL HIP ARTHROPLASTY;  Surgeon: Donato Heinz, MD;  Location: [facility] ORS;  Service: Orthopedics;  Laterality: Left;  . Hardware removal Left 06/24/2015    Procedure: HARDWARE REMOVAL/ DHS;  Surgeon: Donato Heinz, MD;  Location: [facility] ORS;  Service: Orthopedics;  Laterality: Left;    Family History  Problem Relation Age of Onset  . Breast cancer Neg Hx   . Diabetes Father     Social History:  reports that she has never smoked. She has never used smokeless tobacco. She reports that she does not drink alcohol or use illicit drugs.  Allergies  Allergen Reactions  . Statins Other (See Comments)    "leg cramps"  . Penicillins Rash  . Tape Rash    Plastic tape should be better     Medications:  I have reviewed the patient's current medications. Prior to Admission:  Prescriptions prior to admission  Medication Sig Dispense Refill Last Dose  . aspirin EC 81 MG tablet Take 81 mg by mouth daily.   07/18/2015 at Unknown time  . calcium citrate-vitamin D (CITRACAL+D) 315-200 MG-UNIT per tablet Take 1 tablet by mouth 3 (three) times daily.    07/18/2015 at Unknown time  . Flaxseed, Linseed, (FLAX SEED OIL) 1000 MG CAPS Take 1 capsule by mouth 3 (three) times daily.   07/18/2015 at Unknown time  . Ginkgo Biloba 120 MG CAPS Take 120 mg by mouth daily.    07/18/2015 at Unknown time  . hydrochlorothiazide (HYDRODIURIL) 25 MG tablet Take 25 mg by mouth daily.   07/18/2015 at Unknown time  . Inulin (FIBER CHOICE FRUITY BITES PO) Take 1 Dose by mouth once.   07/18/2015 at Unknown time  . loratadine (CLARITIN) 10 MG tablet Take 10 mg by mouth daily.   07/15/2015 at Unknown time  . meclizine (ANTIVERT) 25 MG tablet Take 25 mg by mouth 3 (three) times daily as needed for dizziness.   PRN at PRN  . metoprolol succinate (TOPROL-XL) 25 MG 24 hr tablet Take 25 mg by mouth daily.   07/18/2015  at Unknown time  . oxyCODONE (OXY IR/ROXICODONE) 5 MG immediate release tablet Take 1-2 tablets (5-10 mg total) by mouth every 4 (four) hours as needed for severe pain. 80 tablet 0 07/18/2015 at Unknown time  . pantoprazole (PROTONIX) 40 MG tablet Take 40 mg by mouth daily.   07/18/2015 at Unknown time  . Plant Sterols and Stanols (CHOLESTOFF PO) Take 2 capsules by mouth 2 (two) times daily.   07/18/2015 at Unknown time  . traMADol (ULTRAM) 50 MG tablet Take 1-2 tablets (50-100 mg total) by mouth every 4 (four) hours as needed for moderate pain. 60 tablet 1 07/18/2015 at Unknown time  . vitamin B-12 (CYANOCOBALAMIN) 500 MCG tablet Take 1,000 mcg by mouth daily.    07/18/2015 at Unknown time   Scheduled: . aspirin EC  81 mg Oral Daily  . metoprolol succinate  25 mg Oral Daily  . pantoprazole  40 mg Oral Daily   . vitamin B-12  1,000 mcg Oral Daily    ROS: History obtained from the patient  General ROS: negative for - chills, fatigue, fever, night sweats, weight gain or weight loss Psychological ROS: negative for - behavioral disorder, hallucinations, memory difficulties, mood swings or suicidal ideation Ophthalmic ROS: negative for - blurry vision, double vision, eye pain or loss of vision ENT ROS: as noted in HPI Allergy and Immunology ROS: negative for - hives or itchy/watery eyes Hematological and Lymphatic ROS: negative for - bleeding problems, bruising or swollen lymph nodes Endocrine ROS: negative for - galactorrhea, hair pattern changes, polydipsia/polyuria or temperature intolerance Respiratory ROS: negative for - cough, hemoptysis, shortness of breath or wheezing Cardiovascular ROS: negative for - chest pain, dyspnea on exertion, edema or irregular heartbeat Gastrointestinal ROS: negative for - abdominal pain, diarrhea, hematemesis, nausea/vomiting or stool incontinence Genito-Urinary ROS: negative for - dysuria, hematuria, incontinence or urinary frequency/urgency Musculoskeletal ROS: left hip pain Neurological ROS: as noted in HPI Dermatological ROS: negative for rash and skin lesion changes  Physical Examination: Blood pressure 171/66, pulse 92, temperature 97.8 F (36.6 C), temperature source Oral, resp. rate 16, height 5\' 2"  (1.575 m), weight 83.825 kg (184 lb 12.8 oz), SpO2 98 %.  HEENT-  Normocephalic, no lesions, without obvious abnormality.  Normal external eye and conjunctiva.  Normal TM's bilaterally.  Normal auditory canals and external ears. Normal external nose, mucus membranes and septum.  Normal pharynx.  Tenderness on palpation of the head particularly at the sinuses and jaws bilaterally.   Cardiovascular- S1, S2 normal, pulses palpable throughout   Lungs- chest clear, no wheezing, rales, normal symmetric air entry Abdomen- soft, non-tender; bowel sounds normal; no  masses,  no organomegaly Extremities- no edema Lymph-cervical lymphadenopathy present Musculoskeletal-no joint tenderness, deformity or swelling Skin-warm and dry, no hyperpigmentation, vitiligo, or suspicious lesions  Neurological Examination Mental Status: Alert, oriented, thought content appropriate.  Speech fluent without evidence of aphasia.  Able to follow 3 step commands without difficulty. Cranial Nerves: II: Discs flat bilaterally; Visual fields grossly normal, pupils equal, round, reactive to light and accommodation III,IV, VI: ptosis not present, extra-ocular motions intact bilaterally V,VII: smile symmetric, facial light touch sensation decreased throughout the head VIII: hearing normal bilaterally IX,X: gag reflex present XI: bilateral shoulder shrug XII: midline tongue extension Motor: Right : Upper extremity   5/5    Left:     Upper extremity   5/5  Lower extremity   5/5     Lower extremity   4+/5 hip flexor strength, 5/5 otherwise Tone and bulk:normal  tone throughout; no atrophy noted Sensory: Pinprick and light touch intact throughout, bilaterally Deep Tendon Reflexes: 2+ in the upper extremities and absent in the lower extremities Plantars: Right: downgoing   Left: downgoing Cerebellar: Normal finger-to-nose testing bilaterally.  Normal heel-to-shin testing on the right.  Unable to perform on the left due to pain. Gait: not tested due to safety concerns   Laboratory Studies:   Basic Metabolic Panel:  Recent Labs Lab 07/18/15 1615 07/18/15 2038 07/19/15 0550 07/20/15 0447 07/21/15 0504  NA 121* 121* 124* 129* 132*  K 3.6 3.6 3.7 3.8 3.7  CL 87* 90* 93* 101 102  CO2 GLUCOSE 111* 113* 97 98 100*  BUN CREATININE 0.78 0.76 0.68 0.51 0.49  CALCIUM 8.5* 8.3* 7.9* 8.2* 8.5*  MG  --  2.0  --   --   --   PHOS  --  3.7  --   --   --     Liver Function Tests:  Recent Labs Lab 07/16/15 0114 07/18/15 1615  AST 27 26  ALT  12* 14  ALKPHOS 71 55  BILITOT 0.3 0.2*  PROT 7.3 6.9  ALBUMIN 3.9 3.6    Recent Labs Lab 07/16/15 0114  LIPASE 36   No results for input(s): AMMONIA in the last 168 hours.  CBC:  Recent Labs Lab 07/16/15 0114 07/18/15 1615 07/19/15 0550 07/20/15 0447  WBC 6.6 8.1 6.1 5.5  NEUTROABS 3.0  --   --   --   HGB 10.3* 9.5* 8.9* 9.4*  HCT 31.1* 27.9* 26.3* 27.9*  MCV 82.9 80.4 80.4 81.2  PLT 391 328 254 254    Cardiac Enzymes:  Recent Labs Lab 07/16/15 0114  TROPONINI <0.03    BNP: Invalid input(s): POCBNP  CBG: No results for input(s): GLUCAP in the last 168 hours.  Microbiology: Results for orders placed or performed during the hospital encounter of 07/18/15  MRSA PCR Screening     Status: None   Collection Time: 07/20/15 11:49 AM  Result Value Ref Range Status   MRSA by PCR NEGATIVE NEGATIVE Final    Comment:        The GeneXpert MRSA Assay (FDA approved for NASAL specimens only), is one component of a comprehensive MRSA colonization surveillance program. It is not intended to diagnose MRSA infection nor to guide or monitor treatment for MRSA infections.     Coagulation Studies: No results for input(s): LABPROT, INR in the last 72 hours.  Urinalysis:  Recent Labs Lab 07/16/15 0114 07/18/15 1918  COLORURINE YELLOW* YELLOW*  LABSPEC 1.015 1.020  PHURINE 7.0 6.0  GLUCOSEU NEGATIVE NEGATIVE  HGBUR NEGATIVE NEGATIVE  BILIRUBINUR NEGATIVE NEGATIVE  KETONESUR NEGATIVE NEGATIVE  PROTEINUR NEGATIVE 30*  NITRITE NEGATIVE NEGATIVE  LEUKOCYTESUR NEGATIVE NEGATIVE    Lipid Panel:  No results found for: CHOL, TRIG, HDL, CHOLHDL, VLDL, LDLCALC  HgbA1C: No results found for: HGBA1C  Urine Drug Screen:  No results found for: LABOPIA, COCAINSCRNUR, LABBENZ, AMPHETMU, THCU, LABBARB  Alcohol Level: No results for input(s): ETH in the last 168 hours.  Other results: EKG: sinus rhythm at 67 bpm.  Imaging: No results found.   Assessment/Plan: 70  year old female with hyponatremia and complaints of paresthesias involving her entire head.  Patient also with associated sinus complaints and lymphadenopathy on examination.  Symptoms not suggestive of neurological etiology.  Suspect a more metabolic/metabolic etiology.  TSH, magnesium, phosphorus are normal.  Sodium being addressed.  Recommendations: 1.  B12 2.  Agree with continued addressing of lymphadenopathy and sinus complaints.    Thana FarrLeslie Conception Doebler, MD Neurology [phone] 07/21/2015, 10:06 AM

## 2015-07-21 NOTE — Progress Notes (Signed)
[facility]  ROUNDING NOTE   Subjective:   Na 132  Neurology evaluated patient for facial parathesia  Objective:  Vital signs in last 24 hours:  Temp:  [97.7 F (36.5 C)-98.1 F (36.7 C)] 97.8 F (36.6 C) (03/28 0411) Pulse Rate:  [83-92] 92 (03/28 0411) Resp:  [16-20] 16 (03/28 0411) BP: (153-171)/(53-67) 160/64 mmHg (03/28 1007) SpO2:  [97 %-99 %] 98 % (03/28 0411)  Weight change:  Filed Weights   07/18/15 1611 07/18/15 2206  Weight: 82.555 kg (182 lb) 83.825 kg (184 lb 12.8 oz)    Intake/Output: I/O last 3 completed shifts: In: 3578 [P.O.:400; I.V.:3178] Out: 2050 [Urine:2050]   Intake/Output this shift:  Total I/O In: 240 [P.O.:240] Out: 250 [Urine:250]  Physical Exam: General: NAD, sitting in chair  Head: Normocephalic, atraumatic. Moist oral mucosal membranes  Eyes: Anicteric, PERRL  Neck: Supple, trachea midline  Lungs:  Clear to auscultation  Heart: Regular rate and rhythm  Abdomen:  Soft, nontender,   Extremities: no peripheral edema.  Neurologic: Nonfocal, moving all four extremities, normal sensation of face bilaterally  Skin: No lesions       Basic Metabolic Panel:  Recent Labs Lab 07/18/15 1615 07/18/15 2038 07/19/15 0550 07/20/15 0447 07/21/15 0504  NA 121* 121* 124* 129* 132*  K 3.6 3.6 3.7 3.8 3.7  CL 87* 90* 93* 101 102  CO2 GLUCOSE 111* 113* 97 98 100*  BUN CREATININE 0.78 0.76 0.68 0.51 0.49  CALCIUM 8.5* 8.3* 7.9* 8.2* 8.5*  MG  --  2.0  --   --   --   PHOS  --  3.7  --   --   --     Liver Function Tests:  Recent Labs Lab 07/16/15 0114 07/18/15 1615  AST 27 26  ALT 12* 14  ALKPHOS 71 55  BILITOT 0.3 0.2*  PROT 7.3 6.9  ALBUMIN 3.9 3.6    Recent Labs Lab 07/16/15 0114  LIPASE 36   No results for input(s): AMMONIA in the last 168 hours.  CBC:  Recent Labs Lab 07/16/15 0114 07/18/15 1615 07/19/15 0550 07/20/15 0447  WBC 6.6 8.1 6.1 5.5  NEUTROABS 3.0  --    --   --   HGB 10.3* 9.5* 8.9* 9.4*  HCT 31.1* 27.9* 26.3* 27.9*  MCV 82.9 80.4 80.4 81.2  PLT 391 328 254 254    Cardiac Enzymes:  Recent Labs Lab 07/16/15 0114  TROPONINI <0.03    BNP: Invalid input(s): POCBNP  CBG: No results for input(s): GLUCAP in the last 168 hours.  Microbiology: Results for orders placed or performed during the hospital encounter of 07/18/15  MRSA PCR Screening     Status: None   Collection Time: 07/20/15 11:49 AM  Result Value Ref Range Status   MRSA by PCR NEGATIVE NEGATIVE Final    Comment:        The GeneXpert MRSA Assay (FDA approved for NASAL specimens only), is one component of a comprehensive MRSA colonization surveillance program. It is not intended to diagnose MRSA infection nor to guide or monitor treatment for MRSA infections.     Coagulation Studies: No results for input(s): LABPROT, INR in the last 72 hours.  Urinalysis:  Recent Labs  07/18/15 1918  COLORURINE YELLOW*  LABSPEC 1.020  PHURINE 6.0  GLUCOSEU NEGATIVE  HGBUR NEGATIVE  BILIRUBINUR NEGATIVE  KETONESUR NEGATIVE  PROTEINUR 30*  NITRITE NEGATIVE  LEUKOCYTESUR NEGATIVE  Imaging: No results found.   Medications:   . sodium chloride 50 mL/hr at 07/21/15 0809   . aspirin EC  81 mg Oral Daily  . metoprolol succinate  25 mg Oral Daily  . pantoprazole  40 mg Oral Daily  . vitamin B-12  1,000 mcg Oral Daily   acetaminophen **OR** acetaminophen, diphenhydrAMINE, ondansetron **OR** ondansetron (ZOFRAN) IV, oxyCODONE  Assessment/ Plan:  [name] is a 70 y.o. white female with Hypertension, GERD, diverticulosis, osteoarthritis, left total hip replacement, who [facility]  1. Hyponatremia, hypovolemic in nature due to nausea, vomiting, diarrhea, and use of hydrochlorothiazide. - discontinued hydrochlorothiazide.  - Continue NS, decrease rate to 6350mL/hr - encourage PO intake  2. Hypertension: some elevations. Off  hydrochlorothiazide.  - metoprolol  3. Anemia: hemoglobin 9.4  4. Parasthesia: appreciate neurology input  Will need hospital follow up with Nephrology next week.    LOS: 2 Micki Cassel 3/28/201711:12 AM

## 2015-07-21 NOTE — Discharge Instructions (Signed)
Activity as tolerated with home health PT Diet low-salt Follow-up with primary care physician in a week Follow-up with nephrology in 2 weeks

## 2015-07-23 ENCOUNTER — Other Ambulatory Visit: Payer: Self-pay | Admitting: Internal Medicine

## 2015-07-23 ENCOUNTER — Ambulatory Visit
Admission: RE | Admit: 2015-07-23 | Discharge: 2015-07-23 | Disposition: A | Discharge status: Home / Self Care | Payer: Commercial Managed Care - HMO | Source: Ambulatory Visit | Attending: Internal Medicine | Admitting: Internal Medicine

## 2015-07-23 DIAGNOSIS — R200 Anesthesia of skin: Secondary | ICD-10-CM | POA: Diagnosis present

## 2015-07-23 DIAGNOSIS — R1310 Dysphagia, unspecified: Secondary | ICD-10-CM

## 2015-07-23 DIAGNOSIS — R1013 Epigastric pain: Secondary | ICD-10-CM | POA: Diagnosis present

## 2015-07-25 DIAGNOSIS — R200 Anesthesia of skin: Secondary | ICD-10-CM | POA: Insufficient documentation

## 2015-08-05 DIAGNOSIS — M792 Neuralgia and neuritis, unspecified: Secondary | ICD-10-CM | POA: Insufficient documentation

## 2015-08-10 ENCOUNTER — Encounter
Admission: RE | Admit: 2015-08-10 | Discharge: 2015-08-10 | Disposition: A | Discharge status: Home / Self Care | Payer: Commercial Managed Care - HMO | Source: Ambulatory Visit | Attending: Internal Medicine | Admitting: Internal Medicine

## 2015-08-10 DIAGNOSIS — D649 Anemia, unspecified: Secondary | ICD-10-CM | POA: Insufficient documentation

## 2015-08-10 DIAGNOSIS — R39198 Other difficulties with micturition: Secondary | ICD-10-CM | POA: Insufficient documentation

## 2015-08-10 DIAGNOSIS — E440 Moderate protein-calorie malnutrition: Secondary | ICD-10-CM | POA: Insufficient documentation

## 2015-08-10 DIAGNOSIS — R112 Nausea with vomiting, unspecified: Secondary | ICD-10-CM | POA: Insufficient documentation

## 2015-08-13 ENCOUNTER — Other Ambulatory Visit: Payer: Self-pay | Admitting: Gastroenterology

## 2015-08-13 DIAGNOSIS — R112 Nausea with vomiting, unspecified: Secondary | ICD-10-CM

## 2015-08-13 DIAGNOSIS — R1310 Dysphagia, unspecified: Secondary | ICD-10-CM

## 2015-08-14 DIAGNOSIS — R39198 Other difficulties with micturition: Secondary | ICD-10-CM | POA: Diagnosis not present

## 2015-08-14 DIAGNOSIS — R112 Nausea with vomiting, unspecified: Secondary | ICD-10-CM | POA: Diagnosis not present

## 2015-08-14 DIAGNOSIS — D649 Anemia, unspecified: Secondary | ICD-10-CM | POA: Diagnosis present

## 2015-08-14 LAB — CBC WITH DIFFERENTIAL/PLATELET
BASOS PCT: 0 %
Basophils Absolute: 0 10*3/uL (ref 0–0.1)
EOS ABS: 0.1 10*3/uL (ref 0–0.7)
EOS PCT: 1 %
HCT: 32.2 % — ABNORMAL LOW (ref 35.0–47.0)
HEMOGLOBIN: 10.4 g/dL — AB (ref 12.0–16.0)
Lymphocytes Relative: 12 %
Lymphs Abs: 1.1 10*3/uL (ref 1.0–3.6)
MCH: 26.5 pg (ref 26.0–34.0)
MCHC: 32.3 g/dL (ref 32.0–36.0)
MCV: 82 fL (ref 80.0–100.0)
Monocytes Absolute: 0.6 10*3/uL (ref 0.2–0.9)
Monocytes Relative: 7 %
NEUTROS PCT: 80 %
Neutro Abs: 7.7 10*3/uL — ABNORMAL HIGH (ref 1.4–6.5)
PLATELETS: 505 10*3/uL — AB (ref 150–440)
RBC: 3.93 MIL/uL (ref 3.80–5.20)
RDW: 20.6 % — ABNORMAL HIGH (ref 11.5–14.5)
WBC: 9.6 10*3/uL (ref 3.6–11.0)

## 2015-08-14 LAB — BASIC METABOLIC PANEL
Anion gap: 7 (ref 5–15)
BUN: 11 mg/dL (ref 6–20)
CHLORIDE: 96 mmol/L — AB (ref 101–111)
CO2: 33 mmol/L — ABNORMAL HIGH (ref 22–32)
CREATININE: 0.55 mg/dL (ref 0.44–1.00)
Calcium: 9 mg/dL (ref 8.9–10.3)
Glucose, Bld: 102 mg/dL — ABNORMAL HIGH (ref 65–99)
POTASSIUM: 3.8 mmol/L (ref 3.5–5.1)
SODIUM: 136 mmol/L (ref 135–145)

## 2015-08-15 LAB — URINALYSIS COMPLETE WITH MICROSCOPIC (ARMC ONLY)
BILIRUBIN URINE: NEGATIVE
GLUCOSE, UA: NEGATIVE mg/dL
Hgb urine dipstick: NEGATIVE
Ketones, ur: NEGATIVE mg/dL
Leukocytes, UA: NEGATIVE
Nitrite: NEGATIVE
Protein, ur: NEGATIVE mg/dL
Specific Gravity, Urine: 1.011 (ref 1.005–1.030)
pH: 6 (ref 5.0–8.0)

## 2015-08-17 DIAGNOSIS — D649 Anemia, unspecified: Secondary | ICD-10-CM | POA: Diagnosis not present

## 2015-08-18 ENCOUNTER — Other Ambulatory Visit: Payer: Self-pay | Admitting: *Deleted

## 2015-08-18 DIAGNOSIS — I10 Essential (primary) hypertension: Secondary | ICD-10-CM

## 2015-08-18 LAB — URINE CULTURE

## 2015-08-18 NOTE — Patient Outreach (Signed)
[facility]) Care Management  08/18/2015  Nancy BlakeSandra S Calaway April 21, 1946 [id number]  Subjective: Telephone call to patient's home, female answering phone, states patient not available is currently at a rehab facility due to broken leg.   States patient can be reached at [phone].   Telephone call to patient at rehab center, spoke with patient, and HIPAA verified.  Patient states she is hanging in there and has been at the rehab facility for approximately 1 week.  Discussed [facility] services and patient in agreement to receive services.  Patient states she broke her leg and has numbness of unknown origin in her face.  Patient in agreement with [facility] Social Worker for transition of care due to recent hospitalization and currently in rehab center.    Objective: Per chart review: Patient hospitalized  07/18/15 -07/21/15 for hyponatremia secondary to dehydration.   Patient also has a history of  hypertension.   Assessment: Received [facility] referral on 08/13/15.   Referral source: Kathrine HaddockYuvongala Howell.  Referral reason: Disease and symptom management.  [facility] diagnosis hyponatremia.   Recent hospitalization discharge on 07/21/15.   1 ED visit within 6 months.   Services requested:  [facility] RN[facility].   Patient will [facility] receive [facility] services.   Patient does not have any Telephonic RNCM needs at this time.   Plan: RNCM will send referral [facility] Social Worker for for transition of care due to recent hospitalization and currently in rehab center.    Meziah Blasingame H. Gardiner Barefootooper RN, BSN, CCM [facility] Telephonic CM Phone: [phone] Fax: [phone]

## 2015-08-19 LAB — CULTURE, GROUP A STREP (THRC)

## 2015-08-21 ENCOUNTER — Ambulatory Visit
Admission: RE | Admit: 2015-08-21 | Discharge: 2015-08-21 | Disposition: A | Discharge status: Home / Self Care | Payer: Commercial Managed Care - HMO | Source: Ambulatory Visit | Attending: Gastroenterology | Admitting: Gastroenterology

## 2015-08-21 ENCOUNTER — Other Ambulatory Visit: Payer: Self-pay | Admitting: Neurology

## 2015-08-21 DIAGNOSIS — R112 Nausea with vomiting, unspecified: Secondary | ICD-10-CM

## 2015-08-21 DIAGNOSIS — R9089 Other abnormal findings on diagnostic imaging of central nervous system: Secondary | ICD-10-CM

## 2015-08-21 DIAGNOSIS — R1310 Dysphagia, unspecified: Secondary | ICD-10-CM

## 2015-08-24 ENCOUNTER — Ambulatory Visit: Payer: Self-pay | Admitting: *Deleted

## 2015-08-24 ENCOUNTER — Encounter
Admission: RE | Admit: 2015-08-24 | Discharge: 2015-08-24 | Disposition: A | Discharge status: Home / Self Care | Payer: Commercial Managed Care - HMO | Source: Ambulatory Visit | Attending: Internal Medicine | Admitting: Internal Medicine

## 2015-08-24 DIAGNOSIS — R112 Nausea with vomiting, unspecified: Secondary | ICD-10-CM | POA: Insufficient documentation

## 2015-08-24 DIAGNOSIS — R000 Tachycardia, unspecified: Secondary | ICD-10-CM | POA: Insufficient documentation

## 2015-08-25 ENCOUNTER — Other Ambulatory Visit: Payer: Self-pay | Admitting: *Deleted

## 2015-08-25 NOTE — Patient Outreach (Addendum)
  [facility] Care Management  [facility]  08/25/2015  Nancy BlakeSandra S York 1945-06-18 [id number]  Subjective:  Patient is a 70 year old female, currently receiving rehabilitation [facility].   Objective:   Encounter Medications:  Outpatient Encounter Prescriptions as of 08/25/2015  Medication Sig  . acetaminophen (TYLENOL) 325 MG tablet Take 1 tablet (325 mg total) by mouth every 6 (six) hours as needed for mild pain (or Fever >/= 101).  Marland Kitchen. aspirin EC 81 MG tablet Take 81 mg by mouth daily.  . calcium citrate-vitamin D (CITRACAL+D) 315-200 MG-UNIT per tablet Take 1 tablet by mouth 3 (three) times daily.   . diphenhydrAMINE (BENADRYL) 25 mg capsule Take 1 capsule (25 mg total) by mouth every 8 (eight) hours as needed for itching or allergies.  . Flaxseed, Linseed, (FLAX SEED OIL) 1000 MG CAPS Take 1 capsule by mouth 3 (three) times daily.  . flunisolide (NASALIDE) 25 MCG/ACT (0.025%) SOLN Place 2 sprays into the nose 2 (two) times daily.  . Ginkgo Biloba 120 MG CAPS Take 120 mg by mouth daily.   . Inulin (FIBER CHOICE FRUITY BITES PO) Take 1 Dose by mouth once.  . loratadine (CLARITIN) 10 MG tablet Take 10 mg by mouth daily.  . meclizine (ANTIVERT) 25 MG tablet Take 25 mg by mouth 3 (three) times daily as needed for dizziness.  . metoprolol succinate (TOPROL XL) 50 MG 24 hr tablet Take 1 tablet (50 mg total) by mouth daily. Take with or immediately following a meal.  . oxyCODONE (OXY IR/ROXICODONE) 5 MG immediate release tablet Take 1-2 tablets (5-10 mg total) by mouth every 4 (four) hours as needed for severe pain.  . pantoprazole (PROTONIX) 40 MG tablet Take 40 mg by mouth daily.  . Plant Sterols and Stanols (CHOLESTOFF PO) Take 2 capsules by mouth 2 (two) times daily.  . traMADol (ULTRAM) 50 MG tablet Take 1-2 tablets (50-100 mg total) by mouth every 4 (four) hours as needed for moderate pain.  . vitamin B-12 (CYANOCOBALAMIN) 500 MCG tablet Take 1,000 mcg by  mouth daily.    No facility-administered encounter medications on file as of 08/25/2015.    Functional Status:  In your present state of health, do you have any difficulty performing the following activities: 07/18/2015 06/24/2015  Hearing? N -  Vision? N -  Difficulty concentrating or making decisions? N -  Walking or climbing stairs? Y -  Dressing or bathing? Y -  Doing errands, shopping? N N    Fall/Depression Screening:  No flowsheet data found.  Assessment:  This Child psychotherapistsocial worker arrived to [facility], however patient had been transported to a medical appointment.  This Child psychotherapistsocial worker spoke with discharge planner, Nilsa Nuttingshley Fisher who stated that  there has been no discharge date scheduled for her yet. Plan:  This social worker will follow back up with the discharge planner and patient on 09/01/15 to discuss outcome of the care planning meeting.     Adriana ReamsChrystal Jaiveer Panas, LCSW [facility] Care Management [phone]

## 2015-08-27 DIAGNOSIS — R112 Nausea with vomiting, unspecified: Secondary | ICD-10-CM | POA: Diagnosis not present

## 2015-08-27 DIAGNOSIS — R000 Tachycardia, unspecified: Secondary | ICD-10-CM | POA: Diagnosis not present

## 2015-08-27 LAB — CBC
HCT: 34.6 % — ABNORMAL LOW (ref 35.0–47.0)
HEMOGLOBIN: 11.3 g/dL — AB (ref 12.0–16.0)
MCH: 26.1 pg (ref 26.0–34.0)
MCHC: 32.6 g/dL (ref 32.0–36.0)
MCV: 80.2 fL (ref 80.0–100.0)
Platelets: 361 10*3/uL (ref 150–440)
RBC: 4.31 MIL/uL (ref 3.80–5.20)
RDW: 18.7 % — AB (ref 11.5–14.5)
WBC: 5.7 10*3/uL (ref 3.6–11.0)

## 2015-08-27 LAB — COMPREHENSIVE METABOLIC PANEL
ALK PHOS: 69 U/L (ref 38–126)
ALT: 11 U/L — ABNORMAL LOW (ref 14–54)
ANION GAP: 9 (ref 5–15)
AST: 14 U/L — ABNORMAL LOW (ref 15–41)
Albumin: 3.1 g/dL — ABNORMAL LOW (ref 3.5–5.0)
BUN: 6 mg/dL (ref 6–20)
CALCIUM: 9.1 mg/dL (ref 8.9–10.3)
CO2: 31 mmol/L (ref 22–32)
Chloride: 95 mmol/L — ABNORMAL LOW (ref 101–111)
Creatinine, Ser: 0.54 mg/dL (ref 0.44–1.00)
GFR calc non Af Amer: >60 mL/min (ref >60)
Glucose, Bld: 80 mg/dL (ref 65–99)
Potassium: 3.8 mmol/L (ref 3.5–5.1)
SODIUM: 135 mmol/L (ref 135–145)
TOTAL PROTEIN: 6.6 g/dL (ref 6.5–8.1)
Total Bilirubin: 0.5 mg/dL (ref 0.3–1.2)

## 2015-08-31 ENCOUNTER — Emergency Department
Admission: EM | Admit: 2015-08-31 | Discharge: 2015-08-31 | Disposition: A | Discharge status: Home / Self Care | Payer: Commercial Managed Care - HMO | Attending: Emergency Medicine | Admitting: Emergency Medicine

## 2015-08-31 ENCOUNTER — Emergency Department: Payer: Commercial Managed Care - HMO

## 2015-08-31 ENCOUNTER — Encounter: Payer: Self-pay | Admitting: Emergency Medicine

## 2015-08-31 DIAGNOSIS — I10 Essential (primary) hypertension: Secondary | ICD-10-CM | POA: Diagnosis not present

## 2015-08-31 DIAGNOSIS — Z7982 Long term (current) use of aspirin: Secondary | ICD-10-CM | POA: Insufficient documentation

## 2015-08-31 DIAGNOSIS — E876 Hypokalemia: Secondary | ICD-10-CM | POA: Insufficient documentation

## 2015-08-31 DIAGNOSIS — M179 Osteoarthritis of knee, unspecified: Secondary | ICD-10-CM | POA: Diagnosis not present

## 2015-08-31 DIAGNOSIS — Z96659 Presence of unspecified artificial knee joint: Secondary | ICD-10-CM | POA: Insufficient documentation

## 2015-08-31 DIAGNOSIS — R55 Syncope and collapse: Secondary | ICD-10-CM | POA: Diagnosis present

## 2015-08-31 DIAGNOSIS — Z79899 Other long term (current) drug therapy: Secondary | ICD-10-CM | POA: Insufficient documentation

## 2015-08-31 DIAGNOSIS — Z880 Allergy status to penicillin: Secondary | ICD-10-CM | POA: Insufficient documentation

## 2015-08-31 DIAGNOSIS — W19XXXA Unspecified fall, initial encounter: Secondary | ICD-10-CM

## 2015-08-31 LAB — URINALYSIS COMPLETE WITH MICROSCOPIC (ARMC ONLY)
BACTERIA UA: NONE SEEN
Bilirubin Urine: NEGATIVE
GLUCOSE, UA: NEGATIVE mg/dL
Hgb urine dipstick: NEGATIVE
LEUKOCYTES UA: NEGATIVE
NITRITE: NEGATIVE
Protein, ur: 30 mg/dL — AB
Specific Gravity, Urine: 1.005 (ref 1.005–1.030)
pH: 8 (ref 5.0–8.0)

## 2015-08-31 LAB — CBC
HEMATOCRIT: 37.6 % (ref 35.0–47.0)
HEMOGLOBIN: 12.1 g/dL (ref 12.0–16.0)
MCH: 25.6 pg — ABNORMAL LOW (ref 26.0–34.0)
MCHC: 32 g/dL (ref 32.0–36.0)
MCV: 80 fL (ref 80.0–100.0)
Platelets: 368 10*3/uL (ref 150–440)
RBC: 4.7 MIL/uL (ref 3.80–5.20)
RDW: 19 % — ABNORMAL HIGH (ref 11.5–14.5)
WBC: 9.5 10*3/uL (ref 3.6–11.0)

## 2015-08-31 LAB — BASIC METABOLIC PANEL
ANION GAP: 13 (ref 5–15)
BUN: 7 mg/dL (ref 6–20)
CALCIUM: 9.6 mg/dL (ref 8.9–10.3)
CHLORIDE: 92 mmol/L — AB (ref 101–111)
CO2: 29 mmol/L (ref 22–32)
CREATININE: 0.73 mg/dL (ref 0.44–1.00)
GFR calc non Af Amer: >60 mL/min (ref >60)
Glucose, Bld: 125 mg/dL — ABNORMAL HIGH (ref 65–99)
Potassium: 2.7 mmol/L — CL (ref 3.5–5.1)
SODIUM: 134 mmol/L — AB (ref 135–145)

## 2015-08-31 LAB — MAGNESIUM: Magnesium: 1.9 mg/dL (ref 1.7–2.4)

## 2015-08-31 MED ORDER — SODIUM CHLORIDE 0.9 % IV BOLUS (SEPSIS)
1000.0000 mL | Freq: Once | INTRAVENOUS | Status: AC
  Administered 2015-08-31: 1000 mL via INTRAVENOUS

## 2015-08-31 MED ORDER — POTASSIUM CHLORIDE ER 10 MEQ PO TBCR: 20.0000 meq | EXTENDED_RELEASE_TABLET | Freq: Every day | ORAL | Status: DC

## 2015-08-31 MED ORDER — ACETAMINOPHEN 500 MG PO TABS
1000.0000 mg | ORAL_TABLET | ORAL | Status: AC
  Administered 2015-08-31: 1000 mg via ORAL
  Filled 2015-08-31: qty 2

## 2015-08-31 MED ORDER — MAGNESIUM SULFATE 2 GM/50ML IV SOLN
2.0000 g | Freq: Once | INTRAVENOUS | Status: AC
  Administered 2015-08-31: 2 g via INTRAVENOUS
  Filled 2015-08-31: qty 50

## 2015-08-31 MED ORDER — POTASSIUM CHLORIDE 10 MEQ/100ML IV SOLN
10.0000 meq | Freq: Once | INTRAVENOUS | Status: AC
  Administered 2015-08-31: 10 meq via INTRAVENOUS
  Filled 2015-08-31: qty 100

## 2015-08-31 MED ORDER — POTASSIUM CHLORIDE CRYS ER 20 MEQ PO TBCR
40.0000 meq | EXTENDED_RELEASE_TABLET | Freq: Once | ORAL | Status: AC
  Administered 2015-08-31: 40 meq via ORAL
  Filled 2015-08-31: qty 2

## 2015-08-31 NOTE — ED Notes (Addendum)
MD at bedside. 

## 2015-08-31 NOTE — ED Notes (Signed)
Pt verbalized she wishes to take the potassium medication "later" closer to the time of discharge.

## 2015-08-31 NOTE — ED Notes (Signed)
Pt placed on bed pan and knee brace adjusted on left knee

## 2015-08-31 NOTE — ED Notes (Signed)
Is at [facility] for rehab for left knee fx.  She has a brace onnnow.  Finished her bath and went to stand and she missed the grab bar.  She then went down and was found by staff unresponsive.  She is unsure if she passed out first or after the fall.

## 2015-08-31 NOTE — ED Provider Notes (Signed)
[facility] Emergency Department Provider Note  ____________________________________________  Time seen: Approximately 11:26 AM  I have reviewed the triage vital signs and the nursing notes.   HISTORY  Chief Complaint Fall and Loss of Consciousness    HPI Nancy York is a 70 y.o. female recent fall including a distal femur fracture.  Patient reports that she is at rehabilitation. Today she got out of the bath of reach for the wall, she grabbed for the handicap rail but somehow missed it and fell backwards. She struck her head, nerves reported brief loss of consciousness. She awoke on the floor denies injury.  She reports no chest pain or trouble breathing. No fevers or chills. She's been doing relatively well with rehabilitation. She has had trouble with nausea since leaving the hospital and reports not drinking or eating well, but occasionally taking shakes which she can keep down. She is not having abdominal pain nausea or vomiting now.  Reports no head injury or neck pain. She's had tingling burning numbness from the back of her mid scalp all the way into her mid upper back since falling. She seen neurology who ordered outpatient workup for this. This is relatively chronic.  Past Medical History  Diagnosis Date  . Hypertension   . GERD (gastroesophageal reflux disease)   . Diverticulosis   . Heart murmur     no doctor follows her for this  . Arthritis     joints, shoulder and knees    Patient Active Problem List   Diagnosis Date Noted  . Hyponatremia 07/18/2015  . Generalized weakness 07/18/2015  . GERD (gastroesophageal reflux disease) 07/18/2015  . HTN (hypertension) 07/18/2015  . Dehydration 07/18/2015  . 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 OA (osteoarthritis) of knee 08/24/2014    Past Surgical History  Procedure Laterality Date  . Tonsillectomy    . Dilation and curettage of uterus     . Rotator cuff repair Right 2006    Dr. Gerrit Heck   . Knee arthroscopy Left   . Bilateral carpal tunnel release Bilateral   . 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 Reita Chard  . Fracture surgery Left 2007    fractured left  hip  . Joint replacement Right 08/2014    total knee replacement  . Total hip arthroplasty Left 06/24/2015    Procedure: TOTAL HIP ARTHROPLASTY;  Surgeon: Donato Heinz, MD;  Location: [facility] ORS;  Service: Orthopedics;  Laterality: Left;  . Hardware removal Left 06/24/2015    Procedure: HARDWARE REMOVAL/ DHS;  Surgeon: Donato Heinz, MD;  Location: [facility] ORS;  Service: Orthopedics;  Laterality: Left;    Current Outpatient Rx  Name  Route  Sig  Dispense  Refill  . acetaminophen (TYLENOL) 325 MG tablet   Oral   Take 1 tablet (325 mg total) by mouth every 6 (six) hours as needed for mild pain (or Fever >/= 101).         Marland Kitchen aspirin EC 81 MG tablet   Oral   Take 81 mg by mouth daily.         . calcium citrate-vitamin D (CITRACAL+D) 315-200 MG-UNIT per tablet   Oral   Take 1 tablet by mouth 3 (three) times daily.          . diphenhydrAMINE (BENADRYL) 25 mg capsule   Oral   Take 1 capsule (25 mg total) by mouth every 8 (eight) hours as needed  for itching or allergies.   30 capsule   0   . Flaxseed, Linseed, (FLAX SEED OIL) 1000 MG CAPS   Oral   Take 1 capsule by mouth 3 (three) times daily.         . flunisolide (NASALIDE) 25 MCG/ACT (0.025%) SOLN   Nasal   Place 2 sprays into the nose 2 (two) times daily.   1 Bottle   0   . Ginkgo Biloba 120 MG CAPS   Oral   Take 120 mg by mouth daily.          . Inulin (FIBER CHOICE FRUITY BITES PO)   Oral   Take 1 Dose by mouth once.         . loratadine (CLARITIN) 10 MG tablet   Oral   Take 10 mg by mouth daily.         . meclizine (ANTIVERT) 25 MG tablet   Oral   Take 25 mg by mouth 3 (three) times daily as needed for dizziness.         . metoprolol succinate (TOPROL XL) 50 MG 24 hr  tablet   Oral   Take 1 tablet (50 mg total) by mouth daily. Take with or immediately following a meal.   30 tablet   0   . oxyCODONE (OXY IR/ROXICODONE) 5 MG immediate release tablet   Oral   Take 1-2 tablets (5-10 mg total) by mouth every 4 (four) hours as needed for severe pain.   80 tablet   0   . pantoprazole (PROTONIX) 40 MG tablet   Oral   Take 40 mg by mouth daily.         . Plant Sterols and Stanols (CHOLESTOFF PO)   Oral   Take 2 capsules by mouth 2 (two) times daily.         . potassium chloride (K-DUR) 10 MEQ tablet   Oral   Take 2 tablets (20 mEq total) by mouth daily.   5 tablet   0   . traMADol (ULTRAM) 50 MG tablet   Oral   Take 1-2 tablets (50-100 mg total) by mouth every 4 (four) hours as needed for moderate pain.   60 tablet   1   . vitamin B-12 (CYANOCOBALAMIN) 500 MCG tablet   Oral   Take 1,000 mcg by mouth daily.            Allergies Statins; Penicillins; and Tape  Family History  Problem Relation Age of Onset  . Breast cancer Neg Hx   . Diabetes Father     Social History Social History  Substance Use Topics  . Smoking status: Never Smoker   . Smokeless tobacco: Never Used     Comment: no passive smoke in household  . Alcohol Use: No    Review of Systems Constitutional: No fever/chills Eyes: No visual changes. ENT: No sore throat. Cardiovascular: Denies chest pain. Respiratory: Denies shortness of breath. Gastrointestinal: No abdominal pain.  No nausea, no vomiting.  No diarrhea.  No constipation. Genitourinary: Negative for dysuria. Musculoskeletal: Negative for back pain. No pain in the right leg. No injury to the arms. Achy pain in the left knee, she's been taking Tylenol for this at rehabilitation. No new change. Skin: Negative for rash. Neurological: Negative for headaches, focal weakness.  10-point ROS otherwise negative.  ____________________________________________   PHYSICAL EXAM:  VITAL SIGNS: ED Triage  Vitals  Enc Vitals Group     BP 08/31/15 1042 135/78 mmHg  Pulse Rate 08/31/15 1042 86     Resp --      Temp 08/31/15 1042 97.3 F (36.3 C)     Temp src --      SpO2 08/31/15 1042 98 %     Weight 08/31/15 1042 151 lb 7.3 oz (68.7 kg)     Height 08/31/15 1042  (1.575 m)     Head Cir --      Peak Flow --      Pain Score --      Pain Loc --      Pain Edu? --      Excl. in GC? --    Constitutional: Alert and oriented. Well appearing and in no acute distress. Eyes: Conjunctivae are normal. PERRL. EOMI. Head: Atraumatic. Nose: No congestion/rhinnorhea. Mouth/Throat: Mucous membranes are moist.  Oropharynx non-erythematous. Neck: No stridor.  No cervical thoracic or lumbar spinal tenderness. No deformity. Cardiovascular: Normal rate, regular rhythm. Grossly normal heart sounds.  Good peripheral circulation. Respiratory: Normal respiratory effort.  No retractions. Lungs CTAB. Gastrointestinal: Soft and nontender. No distention. No abdominal bruits. No CVA tenderness. Musculoskeletal:  RIGHT Right upper extremity demonstrates normal strength, good use of all muscles. No edema bruising or contusions of the right shoulder/upper arm, right elbow, right forearm / hand. Full range of motion of the right right upper extremity without pain. No evidence of trauma. Strong radial pulse. Intact median/ulnar/radial neuro-muscular exam.  LEFT Left upper extremity demonstrates normal strength, good use of all muscles. No edema bruising or contusions of the left shoulder/upper arm, left elbow, left forearm / hand. Full range of motion of the left  upper extremity without pain. No evidence of trauma. Strong radial pulse. Intact median/ulnar/radial neuro-muscular exam.  Lower Extremities  No edema. Normal DP/PT pulses bilateral with good cap refill.  Normal neuro-motor function lower extremities bilateral.  RIGHT Right lower extremity demonstrates normal strength, good use of all muscles. No  edema bruising or contusions of the right hip, right knee, right ankle. Full range of motion of the right lower extremity without pain. No pain on axial loading. No evidence of trauma.  LEFT Left lower extremity demonstrates normal strength in the feet and ankles however some limitation in strength, good use of all muscles. No edema bruising or contusions of the hip,  knee, ankle. Full range of motion of the left lower extremity without pain. No pain on axial loading. No evidence of trauma.   Neurologic:  Normal speech and language. No gross focal neurologic deficits. Normal dorsalis pedis pulses are appreciated.  Skin:  Skin is warm, dry and intact. No rash noted. Psychiatric: Mood and affect are normal. Speech and behavior are normal.  ____________________________________________   LABS (all labs ordered are listed, but only abnormal results are displayed)  Labs Reviewed  CBC - Abnormal; Notable for the following:    MCH 25.6 (*)    RDW 19.0 (*)    All other components within normal limits  BASIC METABOLIC PANEL - Abnormal; Notable for the following:    Sodium 134 (*)    Potassium 2.7 (*)    Chloride 92 (*)    Glucose, Bld 125 (*)    All other components within normal limits  URINALYSIS COMPLETEWITH MICROSCOPIC ([facility] ONLY) - Abnormal; Notable for the following:    Color, Urine YELLOW (*)    APPearance CLEAR (*)    Ketones, ur 2+ (*)    Protein, ur 30 (*)    Squamous Epithelial / LPF 0-5 (*)  All other components within normal limits  MAGNESIUM   ____________________________________________  EKG  ED ECG REPORT I, Xan Ingraham, the attending physician, personally viewed and interpreted this ECG.  Date: 08/31/2015 EKG Time: 1040 Rate: 82 Rhythm: normal sinus rhythm QRS Axis: normal Intervals: normal ST/T Wave abnormalities: normal Conduction Disturbances: none Narrative Interpretation: unremarkable  ____________________________________________  RADIOLOGY  DG  Knee 2 Views Left (Final result) Result time: 08/31/15 13:43:08   Final result by Rad Results In Interface (08/31/15 13:43:08)   Narrative:   CLINICAL DATA: Found unresponsive. History of left knee fracture.  EXAM: LEFT KNEE - 1-2 VIEW  COMPARISON: 06/24/2015  FINDINGS: There is a lateral plate involving the distal femur with multiple distal screws. There is a comminuted and mildly displaced fracture involving the mid and distal femur. There may be some callus formation but this could represent an acute or subacute fracture. There is a small suprapatellar joint effusion. The left knee is located.  IMPRESSION: The left knee is located without evidence for an acute knee fracture. Evidence for a small suprapatellar joint effusion.  Comminuted fracture of the distal femur with lateral plate fixation. This appears to represent an acute or subacute fracture. The entire femur fracture is not evaluated and there are no comparison images. Femur fracture could be further evaluated with dedicated femur films.   Electronically Signed By: Richarda Overlie M.D. On: 08/31/2015 13:43          CT Head Wo Contrast (Final result) Result time: 08/31/15 13:07:03   Final result by Rad Results In Interface (08/31/15 13:07:03)   Narrative:   CLINICAL DATA: 70 year old with fall. Tingling and numbness from the occiput and extending down the neck.  EXAM: CT HEAD WITHOUT CONTRAST  CT CERVICAL SPINE WITHOUT CONTRAST  TECHNIQUE: Multidetector CT imaging of the head and cervical spine was performed following the standard protocol without intravenous contrast. Multiplanar CT image reconstructions of the cervical spine were also generated.  COMPARISON: 08/21/2013 and 07/21/2015  FINDINGS: CT HEAD FINDINGS  Stable areas of low-density involving the white matter near the junction of the frontal and parietal lobes. No evidence for acute hemorrhage, mass lesion, midline shift,  hydrocephalus or large infarct. Visualized paranasal sinuses and mastoid air cells are clear. No calvarial fracture.  CT CERVICAL SPINE FINDINGS  Lung apices are clear. Negative for fracture or dislocation in the cervical spine. No suspicious or significant soft tissue swelling in the neck. Again noted are small lymph nodes throughout the neck and similar to the prior neck CT. Alignment of the cervical spine is within normal limits and stable. Normal alignment at the cervicothoracic junction. There is mild disc space narrowing and endplate disease at C4-C5, C5-C6 and C6-C7. The vertebral body heights are maintained.  IMPRESSION: No acute intracranial abnormality.  Intracranial white matter changes are suggestive for chronic small vessel ischemic disease.  Degenerative changes in cervical spine without acute bone abnormality.   Electronically Signed By: Richarda Overlie M.D. On: 08/31/2015 13:07          CT Cervical Spine Wo Contrast (Final result) Result time: 08/31/15 13:07:03   Final result by Rad Results In Interface (08/31/15 13:07:03)   Narrative:   CLINICAL DATA: 70 year old with fall. Tingling and numbness from the occiput and extending down the neck.  EXAM: CT HEAD WITHOUT CONTRAST  CT CERVICAL SPINE WITHOUT CONTRAST  TECHNIQUE: Multidetector CT imaging of the head and cervical spine was performed following the standard protocol without intravenous contrast. Multiplanar CT image reconstructions of the cervical  spine were also generated.  COMPARISON: 08/21/2013 and 07/21/2015  FINDINGS: CT HEAD FINDINGS  Stable areas of low-density involving the white matter near the junction of the frontal and parietal lobes. No evidence for acute hemorrhage, mass lesion, midline shift, hydrocephalus or large infarct. Visualized paranasal sinuses and mastoid air cells are clear. No calvarial fracture.  CT CERVICAL SPINE FINDINGS  Lung apices are clear.  Negative for fracture or dislocation in the cervical spine. No suspicious or significant soft tissue swelling in the neck. Again noted are small lymph nodes throughout the neck and similar to the prior neck CT. Alignment of the cervical spine is within normal limits and stable. Normal alignment at the cervicothoracic junction. There is mild disc space narrowing and endplate disease at C4-C5, C5-C6 and C6-C7. The vertebral body heights are maintained.  IMPRESSION: No acute intracranial abnormality.  Intracranial white matter changes are suggestive for chronic small vessel ischemic disease.  Degenerative changes in cervical spine without acute bone abnormality.   Electronically Signed By: Richarda OverlieAdam Henn M.D. On: 08/31/2015 13:07   Knee x-ray and presentation discussed with Dr. Ernest PineHooten. The reviewed and reports to be healing, no concern for acute fracture. ____________________________________________   PROCEDURES  Procedure(s) performed: None  Critical Care performed: No  ____________________________________________   INITIAL IMPRESSION / ASSESSMENT AND PLAN / ED COURSE  Pertinent labs & imaging results that were available during my care of the patient were reviewed by me and considered in my medical decision making (see chart for details).  Patient presents after what sounds like potentially a mechanical fall where she struck her had not loss of consciousness. She is not complaining of neck pain. She is negative by Nexus except for a chronic tingling that she's been experiencing from her upper neck all the way down since falling previous. Because of this I will add a CT cervical spine to evaluate for possible injury that may been missed initially. She does not have evidence of head injury, complains of no headache, but given the age of 70 we will CT her head to evaluate as she is at risk based on [place] criteria.  Patient reports improvement after hydration. She does report  poor oral intake, and her potassium is notably low. Her baseline labs, but we will replete potassium and magnesium. Discussed with the patient and her family and I suspect she may have passed out slightly due to some dehydration, versus a may otherwise feel describes possible mechanical fall as she was reaching for handle and missed. She'll follow-up closely with her primary care doctor by midweek, and return to the ER if worsening symptoms, weakness, feeling dehydrated, developing abdominal pain fevers or new concerns arise.  Patient appears stable, no distress feeling improved with time discharge. ____________________________________________   FINAL CLINICAL IMPRESSION(S) / ED DIAGNOSES  Final diagnoses:  Accident due to mechanical fall without injury  Hypokalemia      Sharyn CreamerMark Sarita Hakanson, MD 08/31/15 2147

## 2015-08-31 NOTE — ED Notes (Signed)
Pt taken to car via wheelchair with two RN assist and family present pt is tearful upon d/c but denies distress. Pt verbalized tears are due to stress from medical past.

## 2015-08-31 NOTE — Discharge Instructions (Signed)
You have been seen in the Emergency Department (ED) today for a fall.  Your work up does not show any concerning injuries, but you may be somewhat dehydrated and your potassium was low.    Please follow up with your doctor in 2 to 3 days regarding today's Emergency Department (ED) visit and your recent fall.    Return to the ED if you have any headache, confusion, slurred speech, weakness/numbness of any arm or leg, or any increased pain.   Hypokalemia Hypokalemia means that the amount of potassium in the blood is lower than normal.Potassium is a chemical, called an electrolyte, that helps regulate the amount of fluid in the body. It also stimulates muscle contraction and helps nerves function properly.Most of the body's potassium is inside of cells, and only a very small amount is in the blood. Because the amount in the blood is so small, minor changes can be life-threatening. CAUSES  Antibiotics.  Diarrhea or vomiting.  Using laxatives too much, which can cause diarrhea.  Chronic kidney disease.  Water pills (diuretics).  Eating disorders (bulimia).  Low magnesium level.  Sweating a lot. SIGNS AND SYMPTOMS  Weakness.  Constipation.  Fatigue.  Muscle cramps.  Mental confusion.  Skipped heartbeats or irregular heartbeat (palpitations).  Tingling or numbness. DIAGNOSIS  Your health care provider can diagnose hypokalemia with blood tests. In addition to checking your potassium level, your health care provider may also check other lab tests. TREATMENT Hypokalemia can be treated with potassium supplements taken by mouth or adjustments in your current medicines. If your potassium level is very low, you may need to get potassium through a vein (IV) and be monitored in the hospital. A diet high in potassium is also helpful. Foods high in potassium are:  Nuts, such as peanuts and pistachios.  Seeds, such as sunflower seeds and pumpkin seeds.  Peas, lentils, and lima  beans.  Whole grain and bran cereals and breads.  Fresh fruit and vegetables, such as apricots, avocado, bananas, cantaloupe, kiwi, oranges, tomatoes, asparagus, and potatoes.  Orange and tomato juices.  Red meats.  Fruit yogurt. HOME CARE INSTRUCTIONS  Take all medicines as prescribed by your health care provider.  Maintain a healthy diet by including nutritious food, such as fruits, vegetables, nuts, whole grains, and lean meats.  If you are taking a laxative, be sure to follow the directions on the label. SEEK MEDICAL CARE IF:  Your weakness gets worse.  You feel your heart pounding or racing.  You are vomiting or having diarrhea.  You are diabetic and having trouble keeping your blood glucose in the normal range. SEEK IMMEDIATE MEDICAL CARE IF:  You have chest pain, shortness of breath, or dizziness.  You are vomiting or having diarrhea for more than 2 days.  You faint. MAKE SURE YOU:   Understand these instructions.  Will watch your condition.  Will get help right away if you are not doing well or get worse.   This information is not intended to replace advice given to you by your health care provider. Make sure you discuss any questions you have with your health care provider.   Document Released: 04/11/2005 Document Revised: 05/02/2014 Document Reviewed: 10/12/2012 Elsevier Interactive Patient Education 2016 [employer].   Fall Prevention in Hospitals, Adult As a hospital patient, your condition and the treatments you receive can increase your risk for falls. Some additional risk factors for falls in a hospital include:  Being in an unfamiliar environment.  Being on bed  rest.  Your surgery.  Taking certain medicines.  Your tubing requirements, such as intravenous (IV) therapy or catheters. It is important that you learn how to decrease fall risks while at the hospital. Below are important tips that can help prevent falls. SAFETY TIPS FOR  PREVENTING FALLS Talk about your risk of falling.  Ask your health care provider why you are at risk for falling. Is it your medicine, illness, tubing placement, or something else?  Make a plan with your health care provider to keep you safe from falls.  Ask your health care provider or pharmacist about side effects of your medicines. Some medicines can make you dizzy or affect your coordination. Ask for help.  Ask for help before getting out of bed. You may need to press your call button.  Ask for assistance in getting safely to the toilet.  Ask for a walker or cane to be put at your bedside. Ask that most of the side rails on your bed be placed up before your health care provider leaves the room.  Ask family or friends to sit with you.  Ask for things that are out of your reach, such as your glasses, hearing aids, telephone, bedside table, or call button. Follow these tips to avoid falling:  Stay lying or seated, rather than standing, while waiting for help.  Wear rubber-soled slippers or shoes whenever you walk in the hospital.  Avoid quick, sudden movements.  Change positions slowly.  Sit on the side of your bed before standing.  Stand up slowly and wait before you start to walk.  Let your health care provider know if there is a spill on the floor.  Pay careful attention to the medical equipment, electrical cords, and tubes around you.  When you need help, use your call button by your bed or in the bathroom. Wait for one of your health care providers to help you.  If you feel dizzy or unsure of your footing, return to bed and wait for assistance.  Avoid being distracted by the TV, telephone, or another person in your room.  Do not lean or support yourself on rolling objects, such as IV poles or bedside tables.   This information is not intended to replace advice given to you by your health care provider. Make sure you discuss any questions you have with your health  care provider.   Document Released: 04/08/2000 Document Revised: 05/02/2014 Document Reviewed: 12/18/2011 Elsevier Interactive Patient Education [employer] Elsevier Inc.

## 2015-09-03 DIAGNOSIS — R000 Tachycardia, unspecified: Secondary | ICD-10-CM | POA: Diagnosis not present

## 2015-09-03 LAB — BASIC METABOLIC PANEL
ANION GAP: 9 (ref 5–15)
BUN: 7 mg/dL (ref 6–20)
CALCIUM: 9.4 mg/dL (ref 8.9–10.3)
CO2: 31 mmol/L (ref 22–32)
Chloride: 94 mmol/L — ABNORMAL LOW (ref 101–111)
Creatinine, Ser: 0.63 mg/dL (ref 0.44–1.00)
GFR calc Af Amer: >60 mL/min (ref >60)
GFR calc non Af Amer: >60 mL/min (ref >60)
GLUCOSE: 99 mg/dL (ref 65–99)
Potassium: 3.4 mmol/L — ABNORMAL LOW (ref 3.5–5.1)
Sodium: 134 mmol/L — ABNORMAL LOW (ref 135–145)

## 2015-09-08 ENCOUNTER — Other Ambulatory Visit: Payer: Self-pay | Admitting: *Deleted

## 2015-09-08 NOTE — Patient Outreach (Signed)
[facility] Care Management  09/08/2015  Nancy BlakeSandra S York December 02, 1945 [id number]   Phone call to discharge planner, Nilsa NuttingAshley Fisher regarding status of [facility] stay [facility].  Voicemail message left requesting a return call.   Adriana ReamsChrystal Land, LCSW [facility] Care Management [phone]

## 2015-09-08 NOTE — Patient Outreach (Signed)
[facility]) Care Management  09/08/2015  Nancy York [birth year] [id number]   Phone call from discharge planner Nilsa NuttingAshley Fisher from [facility].  Per Morrie SheldonAshley patient is not progressing.  She is not able to eat and is frequently nauseated. Patient had an MRI done last week and due to the result will need to follow up with a Neurologist.  Per Morrie SheldonAshley, a palliative care consult has been ordered.   Plan:  Visit scheduled for 09/09/15 at [facility].   Marcia Lepera Lonn GeorgiaLand, LCSW [facility] Care Management [phone]

## 2015-09-09 ENCOUNTER — Encounter: Payer: Self-pay | Admitting: *Deleted

## 2015-09-09 ENCOUNTER — Other Ambulatory Visit: Payer: Self-pay | Admitting: *Deleted

## 2015-09-09 NOTE — Patient Outreach (Addendum)
[facility]) Care Management  [facility] Social Work  09/09/2015  Nancy York [birth year] [id number]  Subjective:  Patient is a 70 year old female, currently receiving rehab services at  [facility] and rehab  Objective:   Encounter Medications:  Outpatient Encounter Prescriptions as of 09/09/2015  Medication Sig  . acetaminophen (TYLENOL) 325 MG tablet Take 1 tablet (325 mg total) by mouth every 6 (six) hours as needed for mild pain (or Fever >/= 101).  Marland Kitchen aspirin EC 81 MG tablet Take 81 mg by mouth daily.  . calcium citrate-vitamin D (CITRACAL+D) 315-200 MG-UNIT per tablet Take 1 tablet by mouth 3 (three) times daily.   . diphenhydrAMINE (BENADRYL) 25 mg capsule Take 1 capsule (25 mg total) by mouth every 8 (eight) hours as needed for itching or allergies.  . Flaxseed, Linseed, (FLAX SEED OIL) 1000 MG CAPS Take 1 capsule by mouth 3 (three) times daily.  . flunisolide (NASALIDE) 25 MCG/ACT (0.025%) SOLN Place 2 sprays into the nose 2 (two) times daily.  . Ginkgo Biloba 120 MG CAPS Take 120 mg by mouth daily.   . Inulin (FIBER CHOICE FRUITY BITES PO) Take 1 Dose by mouth once.  . loratadine (CLARITIN) 10 MG tablet Take 10 mg by mouth daily.  . meclizine (ANTIVERT) 25 MG tablet Take 25 mg by mouth 3 (three) times daily as needed for dizziness.  . metoprolol succinate (TOPROL XL) 50 MG 24 hr tablet Take 1 tablet (50 mg total) by mouth daily. Take with or immediately following a meal.  . oxyCODONE (OXY IR/ROXICODONE) 5 MG immediate release tablet Take 1-2 tablets (5-10 mg total) by mouth every 4 (four) hours as needed for severe pain.  . pantoprazole (PROTONIX) 40 MG tablet Take 40 mg by mouth daily.  . Plant Sterols and Stanols (CHOLESTOFF PO) Take 2 capsules by mouth 2 (two) times daily.  . potassium chloride (K-DUR) 10 MEQ tablet Take 2 tablets (20 mEq total) by mouth daily.  . traMADol (ULTRAM) 50 MG tablet Take 1-2 tablets (50-100 mg total) by mouth every 4 (four)  hours as needed for moderate pain.  . vitamin B-12 (CYANOCOBALAMIN) 500 MCG tablet Take 1,000 mcg by mouth daily.    No facility-administered encounter medications on file as of 09/09/2015.    Functional Status:  In your present state of health, do you have any difficulty performing the following activities: 09/09/2015 07/18/2015  Hearing? N N  Vision? Y N  Difficulty concentrating or making decisions? N N  Walking or climbing stairs? Y Y  Dressing or bathing? Y Y  Doing errands, shopping? N N  Preparing Food and eating ? Y -  Using the Toilet? N -  In the past six months, have you accidently leaked urine? Y -  Do you have problems with loss of bowel control? N -  Managing your Medications? N -  Managing your Finances? N -  Housekeeping or managing your Housekeeping? N -    Fall/Depression Screening:  PHQ 2/9 Scores 09/09/2015  PHQ - 2 Score 0    Assessment: This Education officer, museum met with patient at [place].  Per patient, she is having a difficult time keeping food down.  Patient states that she is on a liquid diet and ready to go back to solid foods.  Patient describes feeling numbness from her head to her neck, including her mouth and tongue. She states that she has had 3 MRI's and is scheduled for a follow up appointment  with her neurologist on 10/06/15.  Patient reports participating actively in Physical and Occupational  Therapy. Patient's  Husband is supportive,  comes twice a day to visit. Patient;s  sister and daughter also described as positive. Patient will discahrge with home health and is requested  [facility], she has worked with them in the past. Plan: This Education officer, museum will follow up with patient in approximately 1 week to follow up on her status in rehabilitation.

## 2015-09-10 ENCOUNTER — Encounter: Payer: Self-pay | Admitting: *Deleted

## 2015-09-16 ENCOUNTER — Other Ambulatory Visit: Payer: Commercial Managed Care - HMO

## 2015-09-16 ENCOUNTER — Ambulatory Visit: Payer: Commercial Managed Care - HMO

## 2015-09-17 ENCOUNTER — Other Ambulatory Visit: Payer: Self-pay | Admitting: *Deleted

## 2015-09-17 NOTE — Patient Outreach (Signed)
[facility] Care Management  09/17/2015  Charlett BlakeSandra S Campion 04/28/1945 [id number]   Phone call to patient to check status of her stay in rehabilitation.  Per patient she is scheduled for discharge on  Saturday, 09/19/15.  Per patient she believes that she will be returning home with home health through [facility], however is not sure.  Patient reports being ready to discharge home, however is concerned that she still is unable to keep food down.  Per patient, she has a follow up appointment with a Neurologist on 10/06/15.  Phone call made to discharge planner, Nilsa Nuttingshley Fisher [phone].  Voicemail message left to confirm patient's discharge plans. RNCM to be notified of patient's discharge date.   Adriana ReamsChrystal Land, LCSW [facility] Care Management [phone]

## 2015-09-18 ENCOUNTER — Other Ambulatory Visit: Payer: Self-pay | Admitting: *Deleted

## 2015-09-18 DIAGNOSIS — L02415 Cutaneous abscess of right lower limb: Secondary | ICD-10-CM

## 2015-09-18 NOTE — Patient Outreach (Signed)
[facility]) Care Management  09/18/2015  Nancy York 10/27/1945 [id number]  Phone call from Nilsa NuttingAshley Fisher, discharge planner at [facility] to confirm [facility]'s planned [facility] on Saturday, 09/19/15 with home health through [facility].(Physical and Occupational Therapy).    Adriana ReamsChrystal Land, LCSW [facility] Care Management [phone]

## 2015-09-22 ENCOUNTER — Encounter: Payer: Self-pay | Admitting: Emergency Medicine

## 2015-09-22 ENCOUNTER — Inpatient Hospital Stay
Admission: EM | Admit: 2015-09-22 | Discharge: 2015-09-26 | DRG: 603 | Disposition: A | Discharge status: Home Health | Payer: Commercial Managed Care - HMO | Attending: Internal Medicine | Admitting: Internal Medicine

## 2015-09-22 ENCOUNTER — Emergency Department: Payer: Commercial Managed Care - HMO

## 2015-09-22 DIAGNOSIS — Z91048 Other nonmedicinal substance allergy status: Secondary | ICD-10-CM | POA: Diagnosis not present

## 2015-09-22 DIAGNOSIS — L89312 Pressure ulcer of right buttock, stage 2: Secondary | ICD-10-CM | POA: Diagnosis present

## 2015-09-22 DIAGNOSIS — B9562 Methicillin resistant Staphylococcus aureus infection as the cause of diseases classified elsewhere: Secondary | ICD-10-CM | POA: Diagnosis present

## 2015-09-22 DIAGNOSIS — L0291 Cutaneous abscess, unspecified: Secondary | ICD-10-CM | POA: Diagnosis not present

## 2015-09-22 DIAGNOSIS — F419 Anxiety disorder, unspecified: Secondary | ICD-10-CM | POA: Diagnosis present

## 2015-09-22 DIAGNOSIS — R112 Nausea with vomiting, unspecified: Secondary | ICD-10-CM | POA: Diagnosis present

## 2015-09-22 DIAGNOSIS — B370 Candidal stomatitis: Secondary | ICD-10-CM | POA: Diagnosis present

## 2015-09-22 DIAGNOSIS — Z7982 Long term (current) use of aspirin: Secondary | ICD-10-CM | POA: Diagnosis not present

## 2015-09-22 DIAGNOSIS — K219 Gastro-esophageal reflux disease without esophagitis: Secondary | ICD-10-CM | POA: Diagnosis present

## 2015-09-22 DIAGNOSIS — Z79899 Other long term (current) drug therapy: Secondary | ICD-10-CM

## 2015-09-22 DIAGNOSIS — Z833 Family history of diabetes mellitus: Secondary | ICD-10-CM

## 2015-09-22 DIAGNOSIS — Z96642 Presence of left artificial hip joint: Secondary | ICD-10-CM | POA: Diagnosis present

## 2015-09-22 DIAGNOSIS — I10 Essential (primary) hypertension: Secondary | ICD-10-CM | POA: Diagnosis present

## 2015-09-22 DIAGNOSIS — L8990 Pressure ulcer of unspecified site, unspecified stage: Secondary | ICD-10-CM | POA: Diagnosis present

## 2015-09-22 DIAGNOSIS — Z880 Allergy status to penicillin: Secondary | ICD-10-CM | POA: Diagnosis not present

## 2015-09-22 DIAGNOSIS — M19019 Primary osteoarthritis, unspecified shoulder: Secondary | ICD-10-CM | POA: Diagnosis present

## 2015-09-22 DIAGNOSIS — Z96651 Presence of right artificial knee joint: Secondary | ICD-10-CM | POA: Diagnosis present

## 2015-09-22 DIAGNOSIS — E871 Hypo-osmolality and hyponatremia: Secondary | ICD-10-CM | POA: Diagnosis present

## 2015-09-22 DIAGNOSIS — L0231 Cutaneous abscess of buttock: Principal | ICD-10-CM | POA: Diagnosis present

## 2015-09-22 DIAGNOSIS — L0390 Cellulitis, unspecified: Secondary | ICD-10-CM

## 2015-09-22 LAB — COMPREHENSIVE METABOLIC PANEL
ALBUMIN: 2.3 g/dL — AB (ref 3.5–5.0)
ALK PHOS: 138 U/L — AB (ref 38–126)
ALT: 27 U/L (ref 14–54)
AST: 33 U/L (ref 15–41)
Anion gap: 11 (ref 5–15)
BILIRUBIN TOTAL: 0.5 mg/dL (ref 0.3–1.2)
BUN: 20 mg/dL (ref 6–20)
CALCIUM: 8.8 mg/dL — AB (ref 8.9–10.3)
CO2: 27 mmol/L (ref 22–32)
CREATININE: 1.02 mg/dL — AB (ref 0.44–1.00)
Chloride: 89 mmol/L — ABNORMAL LOW (ref 101–111)
GFR, EST NON AFRICAN AMERICAN: 54 mL/min — AB (ref >60)
Glucose, Bld: 114 mg/dL — ABNORMAL HIGH (ref 65–99)
Potassium: 4.7 mmol/L (ref 3.5–5.1)
Sodium: 127 mmol/L — ABNORMAL LOW (ref 135–145)
Total Protein: 6.9 g/dL (ref 6.5–8.1)

## 2015-09-22 LAB — CBC WITH DIFFERENTIAL/PLATELET
BASOS ABS: 0 10*3/uL (ref 0–0.1)
BASOS PCT: 0 %
Band Neutrophils: 5 %
Blasts: 0 %
Eosinophils Absolute: 0 10*3/uL (ref 0–0.7)
Eosinophils Relative: 0 %
HCT: 35.1 % (ref 35.0–47.0)
HEMOGLOBIN: 11.2 g/dL — AB (ref 12.0–16.0)
Lymphocytes Relative: 15 %
Lymphs Abs: 2.4 10*3/uL (ref 1.0–3.6)
MCH: 25.1 pg — AB (ref 26.0–34.0)
MCHC: 31.8 g/dL — ABNORMAL LOW (ref 32.0–36.0)
MCV: 79 fL — ABNORMAL LOW (ref 80.0–100.0)
METAMYELOCYTES PCT: 0 %
MONO ABS: 0.3 10*3/uL (ref 0.2–0.9)
MYELOCYTES: 0 %
Monocytes Relative: 2 %
NEUTROS PCT: 78 %
NRBC: 0 /100{WBCs}
Neutro Abs: 13.5 10*3/uL — ABNORMAL HIGH (ref 1.4–6.5)
Other: 0 %
PLATELETS: 493 10*3/uL — AB (ref 150–440)
PROMYELOCYTES ABS: 0 %
RBC: 4.45 MIL/uL (ref 3.80–5.20)
RDW: 19.6 % — ABNORMAL HIGH (ref 11.5–14.5)
WBC: 16.2 10*3/uL — AB (ref 3.6–11.0)

## 2015-09-22 LAB — URINALYSIS COMPLETE WITH MICROSCOPIC (ARMC ONLY)
Bilirubin Urine: NEGATIVE
Glucose, UA: NEGATIVE mg/dL
KETONES UR: NEGATIVE mg/dL
NITRITE: NEGATIVE
PH: 7 (ref 5.0–8.0)
PROTEIN: 100 mg/dL — AB

## 2015-09-22 LAB — LACTIC ACID, PLASMA: Lactic Acid, Venous: 1.6 mmol/L (ref 0.5–2.0)

## 2015-09-22 MED ORDER — ACETAMINOPHEN 325 MG PO TABS
650.0000 mg | ORAL_TABLET | Freq: Four times a day (QID) | ORAL | Status: DC | PRN
  Administered 2015-09-25 – 2015-09-26 (×2): 650 mg via ORAL
  Filled 2015-09-22 (×2): qty 2

## 2015-09-22 MED ORDER — VANCOMYCIN HCL IN DEXTROSE 1-5 GM/200ML-% IV SOLN
1000.0000 mg | Freq: Once | INTRAVENOUS | Status: AC
  Administered 2015-09-22: 1000 mg via INTRAVENOUS
  Filled 2015-09-22: qty 200

## 2015-09-22 MED ORDER — GINKGO BILOBA 120 MG PO CAPS: 120.0000 mg | ORAL_CAPSULE | Freq: Every day | ORAL | Status: DC

## 2015-09-22 MED ORDER — ONDANSETRON HCL 4 MG PO TABS: 4.0000 mg | ORAL_TABLET | Freq: Four times a day (QID) | ORAL | Status: DC | PRN

## 2015-09-22 MED ORDER — CALCIUM CITRATE-VITAMIN D 500-400 MG-UNIT PO CHEW
0.5000 | CHEWABLE_TABLET | Freq: Three times a day (TID) | ORAL | Status: DC
  Administered 2015-09-23 – 2015-09-26 (×9): 0.5 via ORAL
  Filled 2015-09-22 (×9): qty 1

## 2015-09-22 MED ORDER — CLINDAMYCIN PHOSPHATE 600 MG/50ML IV SOLN
600.0000 mg | Freq: Three times a day (TID) | INTRAVENOUS | Status: DC
  Administered 2015-09-22 – 2015-09-24 (×5): 600 mg via INTRAVENOUS
  Filled 2015-09-22 (×9): qty 50

## 2015-09-22 MED ORDER — ONDANSETRON HCL 4 MG/2ML IJ SOLN: 4.0000 mg | Freq: Four times a day (QID) | INTRAMUSCULAR | Status: DC | PRN

## 2015-09-22 MED ORDER — IOPAMIDOL (ISOVUE-300) INJECTION 61%
100.0000 mL | Freq: Once | INTRAVENOUS | Status: AC | PRN
  Administered 2015-09-22: 100 mL via INTRAVENOUS
  Filled 2015-09-22: qty 100

## 2015-09-22 MED ORDER — SODIUM CHLORIDE 0.9 % IV SOLN
INTRAVENOUS | Status: DC
  Administered 2015-09-22: 23:00:00 via INTRAVENOUS

## 2015-09-22 MED ORDER — PANTOPRAZOLE SODIUM 40 MG PO TBEC
40.0000 mg | DELAYED_RELEASE_TABLET | Freq: Every day | ORAL | Status: DC
  Administered 2015-09-23 – 2015-09-26 (×4): 40 mg via ORAL
  Filled 2015-09-22 (×4): qty 1

## 2015-09-22 MED ORDER — INULIN 1.5 G PO CHEW: CHEWABLE_TABLET | Freq: Once | ORAL | Status: DC

## 2015-09-22 MED ORDER — PLANT STEROLS AND STANOLS 450 MG PO TABS
ORAL_TABLET | Freq: Two times a day (BID) | ORAL | Status: DC
Start: 2015-09-22 — End: 2015-09-22

## 2015-09-22 MED ORDER — CALCIUM CITRATE-VITAMIN D 315-200 MG-UNIT PO TABS: 1.0000 | ORAL_TABLET | Freq: Three times a day (TID) | ORAL | Status: DC

## 2015-09-22 MED ORDER — LORATADINE 10 MG PO TABS
10.0000 mg | ORAL_TABLET | Freq: Every day | ORAL | Status: DC
  Administered 2015-09-23 – 2015-09-26 (×4): 10 mg via ORAL
  Filled 2015-09-22 (×4): qty 1

## 2015-09-22 MED ORDER — DEXTROSE 5 % IV SOLN
1.0000 g | Freq: Once | INTRAVENOUS | Status: AC
  Administered 2015-09-22: 1 g via INTRAVENOUS
  Filled 2015-09-22: qty 10

## 2015-09-22 MED ORDER — LIDOCAINE-EPINEPHRINE (PF) 1 %-1:200000 IJ SOLN
10.0000 mL | Freq: Once | INTRAMUSCULAR | Status: AC
  Administered 2015-09-22: 10 mL
  Filled 2015-09-22: qty 30

## 2015-09-22 MED ORDER — ACETAMINOPHEN 650 MG RE SUPP: 650.0000 mg | Freq: Four times a day (QID) | RECTAL | Status: DC | PRN

## 2015-09-22 MED ORDER — HYDROCODONE-ACETAMINOPHEN 5-325 MG PO TABS
1.0000 | ORAL_TABLET | ORAL | Status: DC | PRN
  Administered 2015-09-22: 1 via ORAL

## 2015-09-22 MED ORDER — VANCOMYCIN HCL IN DEXTROSE 1-5 GM/200ML-% IV SOLN
INTRAVENOUS | Status: AC
  Administered 2015-09-22: 1000 mg via INTRAVENOUS
  Filled 2015-09-22: qty 200

## 2015-09-22 MED ORDER — METOPROLOL SUCCINATE ER 50 MG PO TB24
50.0000 mg | ORAL_TABLET | Freq: Every day | ORAL | Status: DC
  Administered 2015-09-23 – 2015-09-26 (×4): 50 mg via ORAL
  Filled 2015-09-22 (×4): qty 1

## 2015-09-22 MED ORDER — ENOXAPARIN SODIUM 40 MG/0.4ML ~~LOC~~ SOLN
40.0000 mg | SUBCUTANEOUS | Status: DC
  Administered 2015-09-23 – 2015-09-25 (×3): 40 mg via SUBCUTANEOUS
  Filled 2015-09-22 (×3): qty 0.4

## 2015-09-22 MED ORDER — DIPHENHYDRAMINE HCL 25 MG PO CAPS: 25.0000 mg | ORAL_CAPSULE | Freq: Three times a day (TID) | ORAL | Status: DC | PRN

## 2015-09-22 MED ORDER — TRAMADOL HCL 50 MG PO TABS: 50.0000 mg | ORAL_TABLET | ORAL | Status: DC | PRN

## 2015-09-22 MED ORDER — OXYCODONE HCL 5 MG PO TABS: 5.0000 mg | ORAL_TABLET | ORAL | Status: DC | PRN

## 2015-09-22 MED ORDER — MECLIZINE HCL 25 MG PO TABS: 25.0000 mg | ORAL_TABLET | Freq: Three times a day (TID) | ORAL | Status: DC | PRN

## 2015-09-22 MED ORDER — FLAX SEED OIL 1000 MG PO CAPS: 1.0000 | ORAL_CAPSULE | Freq: Three times a day (TID) | ORAL | Status: DC

## 2015-09-22 MED ORDER — ASPIRIN EC 81 MG PO TBEC
81.0000 mg | DELAYED_RELEASE_TABLET | Freq: Every day | ORAL | Status: DC
  Administered 2015-09-23 – 2015-09-26 (×4): 81 mg via ORAL
  Filled 2015-09-22 (×4): qty 1

## 2015-09-22 MED ORDER — VITAMIN B-12 1000 MCG PO TABS
1000.0000 ug | ORAL_TABLET | Freq: Every day | ORAL | Status: DC
  Administered 2015-09-23 – 2015-09-26 (×4): 1000 ug via ORAL
  Filled 2015-09-22 (×6): qty 1

## 2015-09-22 NOTE — ED Notes (Signed)
Admitting MD at bedside.

## 2015-09-22 NOTE — ED Notes (Signed)
States she developed a sore area to right hip on sat  Area very tender and draining purulent drainage

## 2015-09-22 NOTE — ED Provider Notes (Signed)
[facility] Emergency Department Provider Note  ____________________________________________  Time seen: Approximately 1:45 PM  I have reviewed the triage vital signs and the nursing notes.   HISTORY  Chief Complaint Abscess    HPI Nancy York is a 70 y.o. female , NAD, presents to the emergency department accompanied by her family who assists with history. Patient states she was just released from a rehabilitation facility after a left hip replacement. Skin sores were noted about her right hip/buttock area in which she was given York antibiotic ointment to apply at home. Notes the area has been getting larger and more painful. She participated in physical therapy today in which the therapist took a picture of the lesion and sent to her primary care provider. Primary care provider referred her to this emergency department for further treatment of the infection. Patient also notes she has had chronic nausea and vomiting since her hip replacement. Is currently on scopolamine and Zofran which helps to York extent but does not alleviate her symptoms. She has not had any chest pain, shortness of breath, cough or chest congestion. Has not had any back pain nor saddle paresthesias. No fevers, chills, body aches at home. Denies any overt fatigue. Also relays that she was recently in this emergency department for low sodium and after that time had numbness to her neck and face. She has been seeing neurology in regards to the numbness and tingling that she's had and is currently in the middle of the evaluation with them. The patient's family also notes that the patient has had "delusions". She has talked about family members who have not been present in rooms as well as animals in her home.   Past Medical History  Diagnosis Date  . Hypertension   . GERD (gastroesophageal reflux disease)   . Diverticulosis   . Heart murmur     no doctor follows her for this  . Arthritis      joints, shoulder and knees    Patient Active Problem List   Diagnosis Date Noted  . Hyponatremia 07/18/2015  . Generalized weakness 07/18/2015  . GERD (gastroesophageal reflux disease) 07/18/2015  . HTN (hypertension) 07/18/2015  . Dehydration 07/18/2015  . 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 OA (osteoarthritis) of knee 08/24/2014    Past Surgical History  Procedure Laterality Date  . Tonsillectomy    . Dilation and curettage of uterus    . Rotator cuff repair Right 2006    Dr. Gerrit Heckaliff   . Knee arthroscopy Left   . Bilateral carpal tunnel release Bilateral   . 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 Reita Chardhris Smith  . Fracture surgery Left 2007    fractured left  hip  . Joint replacement Right 08/2014    total knee replacement  . Total hip arthroplasty Left 06/24/2015    Procedure: TOTAL HIP ARTHROPLASTY;  Surgeon: Donato HeinzJames P Hooten, MD;  Location: [facility] ORS;  Service: Orthopedics;  Laterality: Left;  . Hardware removal Left 06/24/2015    Procedure: HARDWARE REMOVAL/ DHS;  Surgeon: Donato HeinzJames P Hooten, MD;  Location: [facility] ORS;  Service: Orthopedics;  Laterality: Left;    Current Outpatient Rx  Name  Route  Sig  Dispense  Refill  . acetaminophen (TYLENOL) 325 MG tablet   Oral   Take 1 tablet (325 mg total) by mouth every 6 (six) hours as needed for mild pain (or Fever >/= 101).         .Marland Kitchen  aspirin EC 81 MG tablet   Oral   Take 81 mg by mouth daily.         . calcium citrate-vitamin D (CITRACAL+D) 315-200 MG-UNIT per tablet   Oral   Take 1 tablet by mouth 3 (three) times daily.          . diphenhydrAMINE (BENADRYL) 25 mg capsule   Oral   Take 1 capsule (25 mg total) by mouth every 8 (eight) hours as needed for itching or allergies.   30 capsule   0   . Flaxseed, Linseed, (FLAX SEED OIL) 1000 MG CAPS   Oral   Take 1 capsule by mouth 3 (three) times daily.         . flunisolide (NASALIDE) 25 MCG/ACT (0.025%) SOLN    Nasal   Place 2 sprays into the nose 2 (two) times daily.   1 Bottle   0   . Ginkgo Biloba 120 MG CAPS   Oral   Take 120 mg by mouth daily.          . Inulin (FIBER CHOICE FRUITY BITES PO)   Oral   Take 1 Dose by mouth once.         . loratadine (CLARITIN) 10 MG tablet   Oral   Take 10 mg by mouth daily.         . meclizine (ANTIVERT) 25 MG tablet   Oral   Take 25 mg by mouth 3 (three) times daily as needed for dizziness.         . metoprolol succinate (TOPROL XL) 50 MG 24 hr tablet   Oral   Take 1 tablet (50 mg total) by mouth daily. Take with or immediately following a meal.   30 tablet   0   . oxyCODONE (OXY IR/ROXICODONE) 5 MG immediate release tablet   Oral   Take 1-2 tablets (5-10 mg total) by mouth every 4 (four) hours as needed for severe pain.   80 tablet   0   . pantoprazole (PROTONIX) 40 MG tablet   Oral   Take 40 mg by mouth daily.         . Plant Sterols and Stanols (CHOLESTOFF PO)   Oral   Take 2 capsules by mouth 2 (two) times daily.         . potassium chloride (K-DUR) 10 MEQ tablet   Oral   Take 2 tablets (20 mEq total) by mouth daily.   5 tablet   0   . traMADol (ULTRAM) 50 MG tablet   Oral   Take 1-2 tablets (50-100 mg total) by mouth every 4 (four) hours as needed for moderate pain.   60 tablet   1   . vitamin B-12 (CYANOCOBALAMIN) 500 MCG tablet   Oral   Take 1,000 mcg by mouth daily.            Allergies Statins; Penicillins; and Tape  Family History  Problem Relation Age of Onset  . Breast cancer Neg Hx   . Diabetes Father     Social History Social History  Substance Use Topics  . Smoking status: Never Smoker   . Smokeless tobacco: Never Used     Comment: no passive smoke in household  . Alcohol Use: No     Review of Systems  Constitutional: No fever/chills, fatigue Eyes: No visual changes.  Cardiovascular: No chest pain. Respiratory: Negative chest congestion, cough. No shortness of breath. No  wheezing.  Gastrointestinal: Positive nausea, vomiting that  is chronic. No abdominal pain.  No diarrhea.  No constipation. Genitourinary: Negative for dysuria. No hematuria. No urinary hesitancy, urgency or increased frequency. Musculoskeletal: Negative for back pain.  Skin: Positive skin sores about right buttock/hip. Negative for rash. Neurological: Positive numbness about neck and face but is chronic and being worked up by neurology. Negative for headaches, focal weakness or numbness. No tingling. 10-point ROS otherwise negative.  ____________________________________________   PHYSICAL EXAM:  VITAL SIGNS: ED Triage Vitals  Enc Vitals Group     BP 09/22/15 1304 139/62 mmHg     Pulse Rate 09/22/15 1304 115     Resp 09/22/15 1304 16     Temp 09/22/15 1304 98.1 F (36.7 C)     Temp Source 09/22/15 1304 Oral     SpO2 09/22/15 1304 94 %     Weight 09/22/15 1304 148 lb (67.132 kg)     Height 09/22/15 1304  (1.575 m)     Head Cir --      Peak Flow --      Pain Score 09/22/15 1308 1     Pain Loc --      Pain Edu? --      Excl. in GC? --      Constitutional: Alert and oriented. Well appearing and in no acute distress.Able to follow conversation and verbalized history. Eyes: Conjunctivae are normal. PERRL.  Head: Atraumatic. ENT:      Ears: Scopolamine patch noted behind the right ear. No discharge from ears.      Nose: No congestion/rhinnorhea.      Mouth/Throat: Mucous membranes are moist.  Neck: No stridor. Supple with full range of motion. Hematological/Lymphatic/Immunilogical: No cervical lymphadenopathy. Cardiovascular: Normal rate, regular rhythm. Grossly normal heart sounds. Good peripheral circulation. Respiratory: Normal respiratory effort without tachypnea or retractions. Lungs CTAB with breath sounds noted in all lung fields. Gastrointestinal: Soft and nontender. No distention.  Musculoskeletal: Left leg in a knee brace. No lower extremity tenderness nor edema.   No joint effusions. Neurologic:  Normal speech and language. No gross focal neurologic deficits are appreciated.  Skin:  3 cm oblong flat skin sore with 4 cm surrounding erythema about the right hip/buttock area with visible bruising and weeping. Centralized area is fluctuant. Another 1 cm annular hyperpigmented skin sores noted inferior to the previously noted skin sore without oozing or weeping. Skin is warm, dry. No rash noted. Psychiatric: Mood and affect are normal. Speech and behavior are normal. Patient exhibits appropriate insight and judgement.   ____________________________________________   LABS (all labs ordered are listed, but only abnormal results are displayed)  Labs Reviewed  COMPREHENSIVE METABOLIC PANEL - Abnormal; Notable for the following:    Sodium 127 (*)    Chloride 89 (*)    Glucose, Bld 114 (*)    Creatinine, Ser 1.02 (*)    Calcium 8.8 (*)    Albumin 2.3 (*)    Alkaline Phosphatase 138 (*)    GFR calc non Af Amer 54 (*)    All other components within normal limits  CBC WITH DIFFERENTIAL/PLATELET - Abnormal; Notable for the following:    WBC 16.2 (*)    Hemoglobin 11.2 (*)    MCV 79.0 (*)    MCH 25.1 (*)    MCHC 31.8 (*)    RDW 19.6 (*)    Platelets 493 (*)    Neutro Abs 13.5 (*)    All other components within normal limits  CULTURE, BLOOD (ROUTINE X 2)  CULTURE,  BLOOD (ROUTINE X 2)  LACTIC ACID, PLASMA  URINALYSIS COMPLETEWITH MICROSCOPIC ([facility] ONLY)   ____________________________________________  EKG  None ____________________________________________  RADIOLOGY I have personally viewed and evaluated these images (plain radiographs) as part of my medical decision making, as well as reviewing the written report by the radiologist.  Ct Pelvis W Contrast  09/22/2015  CLINICAL DATA:  Right hips sore developing on Saturday. Purulent drainage. EXAM: CT PELVIS WITH CONTRAST TECHNIQUE: Multidetector CT imaging of the pelvis was performed using the  standard protocol following the bolus administration of intravenous contrast. CONTRAST:  ISOVUE-300 IOPAMIDOL (ISOVUE-300) INJECTION 61% COMPARISON:  07/16/2015 abdominal CT FINDINGS: Skin thickening and subcutaneous fat reticulation/expansion in the right buttock with asymmetric expansion and edema involving the gluteus maximus. Towards the gluteal cleft there is more dense fluid accumulation in the subcutaneous fat without mature or rim enhancing fluid collection. No pyomyositis noted. No intra-abdominal or intrapelvic inflammatory changes are seen. No intra-abdominal inflammation noted. Fibroid uterus. Interval but healing left inferior pubic ramus fracture with displacement. There is associated medial compartment heterotopic ossification. Transverse fracture across the left acetabulum without displacement or lucency around the left hip arthroplasty. Mature periosteal reaction along the acetabular fracture as well, consistent with healing. No associated sacral fracture. 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 IMPRESSION: 1. Right gluteal cellulitis and myositis. Phlegmonous changes in the subcutaneous compartment without drainable collection. 2. Healing left inferior pubic ramus and transverse acetabular fractures. No displacement of a total left hip arthroplasty. Electronically Signed   By: Marnee Spring M.D.   On: 09/22/2015 17:15    ____________________________________________    PROCEDURES  Procedure(s) performed: INCISION AND DRAINAGE Performed by: Hope Pigeon Consent: Verbal consent obtained. Risks and benefits: risks, benefits and alternatives were discussed Type: abscess  Body area: Right hip  Anesthesia: local infiltration  Incision was made with a scalpel.  Local anesthetic: lidocaine 1% with epinephrine  Anesthetic total: 4 ml  Complexity: complex Blunt dissection to break up  loculations  Drainage: purulent  Drainage amount: 5cc  Packing material: None   Patient tolerance: Patient tolerated the procedure well with no immediate complications.      Medications  lidocaine-EPINEPHrine (XYLOCAINE-EPINEPHrine) 1 %-1:200000 (PF) injection 10 mL (not administered)  vancomycin (VANCOCIN) IVPB 1000 mg/200 mL premix (1,000 mg Intravenous New Bag/Given 09/22/15 1624)  cefTRIAXone (ROCEPHIN) 1 g in dextrose 5 % 50 mL IVPB (1 g Intravenous New Bag/Given 09/22/15 1624)  iopamidol (ISOVUE-300) 61 % injection 100 mL (100 mLs Intravenous Contrast Given 09/22/15 1648)     ____________________________________________   INITIAL IMPRESSION / ASSESSMENT AND PLAN / ED COURSE  I spoke with Dr. Silverio Lay, attending physician, in regards to the patient's history and presentation. He personally evaluated the patient and has assisted in her care throughout her ED course which is located in her medical records.   Patient's care will be transferred to Dr. Glenetta Hew, attending physician in the emergency department.   ____________________________________________  FINAL CLINICAL IMPRESSION(S) / ED DIAGNOSES  Final diagnoses:  Abscess and cellulitis      NEW MEDICATIONS STARTED DURING THIS VISIT:  New Prescriptions   No medications on file         Hope Pigeon, PA-C 09/22/15 1726  Richardean Canal, MD 09/22/15 2154

## 2015-09-22 NOTE — H&P (Signed)
[facility] [place] [facility]   PATIENT NAME: Nancy York    MR#:  [id number]  DATE OF BIRTH:  1946/04/02  DATE OF ADMISSION:  09/22/2015  PRIMARY CARE PHYSICIAN: SPARKS,JEFFREY D, MD   REQUESTING/REFERRING PHYSICIAN: Dr Rolla Plate  CHIEF COMPLAINT:   Buttock abscess HISTORY OF PRESENT ILLNESS:  Nancy York  is a 70 y.o. female with a known history of Recent left hip fracture status post arthroplasty and essential hypertension who presents with a buttock abscess. Patient reports that she has not been very mobile at [facility] center and developed a buttock sore. This then apparently turned into an abscess. In the emergency room this is drained. CT scan after incision and drainage by ER PA reveals no abscess. Patient denies fever just pain that brought her to the emergency room. She was given vancomycin and Rocephin in the emergency room.  Daughter bedside is also concern the patient has had a little bit more confusion over the past few days.  PAST MEDICAL HISTORY:   Past Medical History  Diagnosis Date  . Hypertension   . GERD (gastroesophageal reflux disease)   . Diverticulosis   . Heart murmur     no doctor follows her for this  . Arthritis     joints, shoulder and knees    PAST SURGICAL HISTORY:   Past Surgical History  Procedure Laterality Date  . Tonsillectomy    . Dilation and curettage of uterus    . Rotator cuff repair Right 2006    Dr. Gerrit Heck   . Knee arthroscopy Left   . Bilateral carpal tunnel release Bilateral   . 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 Reita Chard  . Fracture surgery Left 2007    fractured left  hip  . Joint replacement Right 08/2014    total knee replacement  . Total hip arthroplasty Left 06/24/2015    Procedure: TOTAL HIP ARTHROPLASTY;  Surgeon: Donato Heinz, MD;  Location: [facility] ORS;  Service: Orthopedics;  Laterality: Left;  . Hardware removal Left 06/24/2015    Procedure: HARDWARE  REMOVAL/ DHS;  Surgeon: Donato Heinz, MD;  Location: [facility] ORS;  Service: Orthopedics;  Laterality: Left;    SOCIAL HISTORY:   Social History  Substance Use Topics  . Smoking status: Never Smoker   . Smokeless tobacco: Never Used     Comment: no passive smoke in household  . Alcohol Use: No    FAMILY HISTORY:   Family History  Problem Relation Age of Onset  . Breast cancer Neg Hx   . Diabetes Father     DRUG ALLERGIES:   Allergies  Allergen Reactions  . Statins Other (See Comments)    "leg cramps"  . Penicillins Rash  . Tape Rash    Plastic tape should be better    REVIEW OF SYSTEMS:   Review of Systems  Constitutional: Negative for fever, chills and malaise/fatigue.  HENT: Negative for ear discharge, ear pain, hearing loss, nosebleeds and sore throat.   Eyes: Negative for blurred vision and pain.  Respiratory: Negative for cough, hemoptysis, shortness of breath and wheezing.   Cardiovascular: Negative for chest pain, palpitations and leg swelling.  Gastrointestinal: Negative for nausea, vomiting, abdominal pain, diarrhea and blood in stool.  Genitourinary: Negative for dysuria.  Musculoskeletal: Positive for joint pain and falls. Negative for back pain.  Skin:       Buttock abscess  Neurological: Negative for dizziness, tremors, speech change, focal weakness, seizures  and headaches.  Endo/Heme/Allergies: Does not bruise/bleed easily.  Psychiatric/Behavioral: Positive for memory loss. Negative for depression, suicidal ideas and hallucinations.    MEDICATIONS AT HOME:   Prior to Admission medications   Medication Sig Start Date End Date Taking? Authorizing Provider  acetaminophen (TYLENOL) 325 MG tablet Take 1 tablet (325 mg total) by mouth every 6 (six) hours as needed for mild pain (or Fever >/= 101). 07/21/15   Ramonita Lab, MD  aspirin EC 81 MG tablet Take 81 mg by mouth daily.    Historical Provider, MD  calcium citrate-vitamin D (CITRACAL+D) 315-200 MG-UNIT  per tablet Take 1 tablet by mouth 3 (three) times daily.     Historical Provider, MD  diphenhydrAMINE (BENADRYL) 25 mg capsule Take 1 capsule (25 mg total) by mouth every 8 (eight) hours as needed for itching or allergies. 07/21/15   Ramonita Lab, MD  Flaxseed, Linseed, (FLAX SEED OIL) 1000 MG CAPS Take 1 capsule by mouth 3 (three) times daily.    Historical Provider, MD  flunisolide (NASALIDE) 25 MCG/ACT (0.025%) SOLN Place 2 sprays into the nose 2 (two) times daily. 07/21/15   Ramonita Lab, MD  Ginkgo Biloba 120 MG CAPS Take 120 mg by mouth daily.     Historical Provider, MD  Inulin (FIBER CHOICE FRUITY BITES PO) Take 1 Dose by mouth once.    Historical Provider, MD  loratadine (CLARITIN) 10 MG tablet Take 10 mg by mouth daily.    Historical Provider, MD  meclizine (ANTIVERT) 25 MG tablet Take 25 mg by mouth 3 (three) times daily as needed for dizziness.    Historical Provider, MD  metoprolol succinate (TOPROL XL) 50 MG 24 hr tablet Take 1 tablet (50 mg total) by mouth daily. Take with or immediately following a meal. 07/21/15   Ramonita Lab, MD  oxyCODONE (OXY IR/ROXICODONE) 5 MG immediate release tablet Take 1-2 tablets (5-10 mg total) by mouth every 4 (four) hours as needed for severe pain. 06/25/15   Tera Partridge, PA  pantoprazole (PROTONIX) 40 MG tablet Take 40 mg by mouth daily.    Historical Provider, MD  Plant Sterols and Stanols (CHOLESTOFF PO) Take 2 capsules by mouth 2 (two) times daily.    Historical Provider, MD  potassium chloride (K-DUR) 10 MEQ tablet Take 2 tablets (20 mEq total) by mouth daily. 08/31/15   Sharyn Creamer, MD  traMADol (ULTRAM) 50 MG tablet Take 1-2 tablets (50-100 mg total) by mouth every 4 (four) hours as needed for moderate pain. 06/25/15   Tera Partridge, PA  vitamin B-12 (CYANOCOBALAMIN) 500 MCG tablet Take 1,000 mcg by mouth daily.     Historical Provider, MD      VITAL SIGNS:  Blood pressure 130/78, pulse 110, temperature 98.1 F (36.7 C), temperature source Oral, resp. rate  20, height 5\' 2"  (1.575 m), weight 67.132 kg (148 lb), SpO2 97 %.  PHYSICAL EXAMINATION:   Physical Exam  Constitutional: She is oriented to person, place, and time and well-developed, well-nourished, and in no distress. No distress.  HENT:  Head: Normocephalic.  Eyes: No scleral icterus.  Neck: Normal range of motion. Neck supple. No JVD present. No tracheal deviation present.  Cardiovascular: Normal rate and regular rhythm.  Exam reveals no gallop and no friction rub.   Murmur heard. Pulmonary/Chest: Effort normal and breath sounds normal. No respiratory distress. She has no wheezes. She has no rales. She exhibits no tenderness.  Abdominal: Soft. Bowel sounds are normal. She exhibits no distension and no  mass. There is no tenderness. There is no rebound and no guarding.  Musculoskeletal: Normal range of motion. She exhibits no edema.  Neurological: She is alert and oriented to person, place, and time.  Skin: Skin is warm. No rash noted. No erythema.  Buttock abscess is status post IND does have an open cut from the incision. No purulent discharge  Psychiatric: Affect and judgment normal.      LABORATORY PANEL:   CBC  Recent Labs Lab 09/22/15 1445  WBC 16.2*  HGB 11.2*  HCT 35.1  PLT 493*   ------------------------------------------------------------------------------------------------------------------  Chemistries   Recent Labs Lab 09/22/15 1445  NA 127*  K 4.7  CL 89*  CO2 27  GLUCOSE 114*  BUN 20  CREATININE 1.02*  CALCIUM 8.8*  AST 33  ALT 27  ALKPHOS 138*  BILITOT 0.5   ------------------------------------------------------------------------------------------------------------------  Cardiac Enzymes No results for input(s): TROPONINI in the last 168 hours. ------------------------------------------------------------------------------------------------------------------  RADIOLOGY:  Ct Pelvis W Contrast  09/22/2015  CLINICAL DATA:  Right hips sore  developing on Saturday. Purulent drainage. EXAM: CT PELVIS WITH CONTRAST TECHNIQUE: Multidetector CT imaging of the pelvis was performed using the standard protocol following the bolus administration of intravenous contrast. CONTRAST:  100mL ISOVUE-300 IOPAMIDOL (ISOVUE-300) INJECTION 61% COMPARISON:  07/16/2015 abdominal CT FINDINGS: Skin thickening and subcutaneous fat reticulation/expansion in the right buttock with asymmetric expansion and edema involving the gluteus maximus. Towards the gluteal cleft there is more dense fluid accumulation in the subcutaneous fat without mature or rim enhancing fluid collection. No pyomyositis noted. No intra-abdominal or intrapelvic inflammatory changes are seen. No intra-abdominal inflammation noted. Fibroid uterus. Interval but healing left inferior pubic ramus fracture with displacement. There is associated medial compartment heterotopic ossification. Transverse fracture across the left acetabulum without displacement or lucency around the left hip arthroplasty. Mature periosteal reaction along the acetabular fracture as well, consistent with healing. No associated sacral fracture. 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 IMPRESSION: 1. Right gluteal cellulitis and myositis. Phlegmonous changes in the subcutaneous compartment without drainable collection. 2. Healing left inferior pubic ramus and transverse acetabular fractures. No displacement of a total left hip arthroplasty. Electronically Signed   By: Marnee SpringJonathon  Watts M.D.   On: 09/22/2015 17:15    EKG:     IMPRESSION AND PLAN:   70 year old female status post left hip arthroplasty after mechanical fall who is at rehabilitation presents today with buttock abscess status post I&D in the emergency room.  1. Buttock abscess: This has now been drained by the ER PA. CT scan does not show evidence of abscess. Start clindamycin and follow up on  MRSA PCR. Follow up on wound culture and wound care consult.  2. Recent arthroplasty after mechanical fall: Patient will need physical therapy consultation and critical social worker consultation.  3. Essential hypertension: Continue metoprolol.  4. Hyponatremia: This is due to poor by mouth intake. Start IV fluids and repeat BMP in a.m.    All the records are reviewed and case discussed with ED provider. Management plans discussed with the patient and she in agreement  CODE STATUS: full  TOTAL TIME TAKING CARE OF THIS PATIENT: 45 minutes.    Kowen Kluth M.D on 09/22/2015 at 5:46 PM  Between 7am to 6pm - Pager - [phone]  After 6pm go to [email or web address] - password [email or web address]  [facility]  Office  [phone]  CC: Primary care physician; Marguarite ArbourSPARKS,JEFFREY D, MD

## 2015-09-22 NOTE — Progress Notes (Addendum)
PHARMACIST - PHYSICIAN ORDER COMMUNICATION  CONCERNING: P&T Medication Policy on Herbal Medications  DESCRIPTION:  This patient's order for:  Flax seed oil caps, ginko biloba caps, plant sterols and stanols, and inulin   have been noted.  This product(s) is classified as an "herbal" or natural product. Due to a lack of definitive safety studies or FDA approval, nonstandard manufacturing practices, plus the potential risk of unknown drug-drug interactions while on inpatient medications, the Pharmacy and Therapeutics Committee does not permit the use of "herbal" or natural products of this type within [facility].   ACTION TAKEN: The pharmacy department is unable to verify these orders at this time. Please reevaluate patient's clinical condition at discharge and address if the herbal or natural product(s) should be resumed at that time.

## 2015-09-23 LAB — BASIC METABOLIC PANEL
Anion gap: 8 (ref 5–15)
BUN: 16 mg/dL (ref 6–20)
CHLORIDE: 94 mmol/L — AB (ref 101–111)
CO2: 27 mmol/L (ref 22–32)
CREATININE: 0.7 mg/dL (ref 0.44–1.00)
Calcium: 8.5 mg/dL — ABNORMAL LOW (ref 8.9–10.3)
GFR calc Af Amer: >60 mL/min (ref >60)
GFR calc non Af Amer: >60 mL/min (ref >60)
GLUCOSE: 106 mg/dL — AB (ref 65–99)
Potassium: 4.6 mmol/L (ref 3.5–5.1)
SODIUM: 129 mmol/L — AB (ref 135–145)

## 2015-09-23 LAB — CBC
HCT: 30.9 % — ABNORMAL LOW (ref 35.0–47.0)
Hemoglobin: 10 g/dL — ABNORMAL LOW (ref 12.0–16.0)
MCH: 25.3 pg — ABNORMAL LOW (ref 26.0–34.0)
MCHC: 32.4 g/dL (ref 32.0–36.0)
MCV: 78.2 fL — AB (ref 80.0–100.0)
PLATELETS: 435 10*3/uL (ref 150–440)
RBC: 3.95 MIL/uL (ref 3.80–5.20)
RDW: 19.4 % — AB (ref 11.5–14.5)
WBC: 16.8 10*3/uL — AB (ref 3.6–11.0)

## 2015-09-23 LAB — MRSA PCR SCREENING: MRSA BY PCR: POSITIVE — AB

## 2015-09-23 MED ORDER — CHLORHEXIDINE GLUCONATE CLOTH 2 % EX PADS
6.0000 | MEDICATED_PAD | Freq: Every day | CUTANEOUS | Status: DC
  Administered 2015-09-23 – 2015-09-26 (×4): 6 via TOPICAL

## 2015-09-23 MED ORDER — MUPIROCIN 2 % EX OINT
1.0000 "application " | TOPICAL_OINTMENT | Freq: Two times a day (BID) | CUTANEOUS | Status: DC
  Administered 2015-09-23 – 2015-09-26 (×7): 1 via NASAL
  Filled 2015-09-23: qty 22

## 2015-09-23 MED ORDER — SODIUM CHLORIDE 0.9 % IV SOLN
INTRAVENOUS | Status: AC
  Administered 2015-09-23: 09:00:00 via INTRAVENOUS

## 2015-09-23 NOTE — Clinical Social Work Note (Signed)
Clinical Social Work Assessment  Patient Details  Name: Nancy York MRN: [id number] Date of Birth: 08/30/1945  Date of referral:  09/23/15               Reason for consult:  Other (Comment Required) (CSW received consult that patient is from a SNF. )                Permission sought to share information with:    Permission granted to share information::     Name::        Agency::     Relationship::     Contact Information:     Housing/Transportation Living arrangements for the past 2 months:  Single Family Home, Skilled Nursing Facility Source of Information:  Patient Patient Interpreter Needed:  None Criminal Activity/Legal Involvement Pertinent to Current Situation/Hospitalization:  No - Comment as needed Significant Relationships:  Spouse Lives with:  Spouse Do you feel safe going back to the place where you live?  No Need for family participation in patient care:  Yes (Comment)  Care giving concerns:  Patient lives in [place] with her husband Fred.    Social Worker assessment / plan:  Clinical Social Worker (CSW) received consult that patient is from a SNF. CSW met with patient alone at bedside. Patient was alert and oriented and was sitting up in the bed. CSW introduced self and explained role of CSW department. Patient reported that she was discharged home on Saturday from [facility]. Patient reported that her plan is to return home from the hospital and is "tired of rehab." CSW provided emotional support. RN Case Manager is aware of above. CSW will continue to follow and assist as needed.   Employment status:  Disabled (Comment on whether or not currently receiving Disability), Retired Insurance information:  Managed Medicare PT Recommendations:  Not assessed at this time Information / Referral to community resources:  Other (Comment Required) (Patient will return home with home health )  Patient/Family's Response to care:  Patient prefers to return home from  the hospital.   Patient/Family's Understanding of and Emotional Response to Diagnosis, Current Treatment, and Prognosis:  Patient was pleasant and thanked CSW for visit.   Emotional Assessment Appearance:  Appears stated age Attitude/Demeanor/Rapport:    Affect (typically observed):  Accepting, Adaptable, Pleasant Orientation:  Oriented to Self, Oriented to Place, Oriented to  Time, Oriented to Situation Alcohol / Substance use:  Not Applicable Psych involvement (Current and /or in the community):  No (Comment)  Discharge Needs  Concerns to be addressed:  Discharge Planning Concerns Readmission within the last 30 days:  No Current discharge risk:  Dependent with Mobility Barriers to Discharge:  Continued Medical Work up   Morgan,  G, LCSW 09/23/2015, 3:00 PM  

## 2015-09-23 NOTE — Progress Notes (Signed)
[facility] [place] [facility]   PATIENT NAME: Nancy SchaumannSandra York    MR#:  [id number]  DATE OF BIRTH:  1945-12-06  SUBJECTIVE:  CHIEF COMPLAINT:   Chief Complaint  Patient presents with  . Abscess   No pain. Feels weak. No shortness of breath or fever or vomiting or abdominal pain.  REVIEW OF SYSTEMS:    Review of Systems  Constitutional: Negative for fever and chills.  HENT: Negative for sore throat.   Eyes: Negative for blurred vision, double vision and pain.  Respiratory: Negative for cough, hemoptysis, shortness of breath and wheezing.   Cardiovascular: Negative for chest pain, palpitations, orthopnea and leg swelling.  Gastrointestinal: Negative for heartburn, nausea, vomiting, abdominal pain, diarrhea and constipation.  Genitourinary: Negative for dysuria and hematuria.  Musculoskeletal: Positive for back pain. Negative for joint pain.  Skin: Negative for rash.  Neurological: Positive for weakness. Negative for headaches.    DRUG ALLERGIES:   Allergies  Allergen Reactions  . Statins Other (See Comments)    Reaction:  Leg cramps   . Penicillins Rash and Other (See Comments)    Has patient had a PCN reaction causing immediate rash, facial/tongue/throat swelling, SOB or lightheadedness with hypotension: No Has patient had a PCN reaction causing severe rash involving mucus membranes or skin necrosis: No Has patient had a PCN reaction that required hospitalization No Has patient had a PCN reaction occurring within the last 10 years: Yes If all of the above answers are "NO", then may proceed with Cephalosporin use.  . Tape Rash    VITALS:  Blood pressure 130/48, pulse 94, temperature 97.7 F (36.5 C), temperature source Oral, resp. rate 18, height 5\' 2"  (1.575 m), weight 67.132 kg (148 lb), SpO2 93 %.  PHYSICAL EXAMINATION:   Physical Exam  GENERAL:  70 y.o.-year-old patient lying in the bed with no acute distress.  EYES: Pupils equal,  round, reactive to light and accommodation. No scleral icterus. Extraocular muscles intact.  HEENT: Head atraumatic, normocephalic. Oropharynx and nasopharynx clear.  NECK:  Supple, no jugular venous distention. No thyroid enlargement, no tenderness.  LUNGS: Normal breath sounds bilaterally, no wheezing, rales, rhonchi. No use of accessory muscles of respiration.  CARDIOVASCULAR: S1, S2 normal. No murmurs, rubs, or gallops.  ABDOMEN: Soft, nontender, nondistended. Bowel sounds present. No organomegaly or mass.  EXTREMITIES: No cyanosis, clubbing or edema b/l.    NEUROLOGIC: Cranial nerves II through XII are intact. Moves all 4 extremities.   PSYCHIATRIC: The patient is alert and oriented x 3.  SKIN: Sacral pressure ulcer. Right gluteal incision site with some purulent discharge. No fluctuance noticed. Tender.  LABORATORY PANEL:   CBC  Recent Labs Lab 09/23/15 0647  WBC 16.8*  HGB 10.0*  HCT 30.9*  PLT 435   ------------------------------------------------------------------------------------------------------------------ Chemistries   Recent Labs Lab 09/22/15 1445 09/23/15 0647  NA 127* 129*  K 4.7 4.6  CL 89* 94*  CO2 27 27  GLUCOSE 114* 106*  BUN 20 16  CREATININE 1.02* 0.70  CALCIUM 8.8* 8.5*  AST 33  --   ALT 27  --   ALKPHOS 138*  --   BILITOT 0.5  --    ------------------------------------------------------------------------------------------------------------------  Cardiac Enzymes No results for input(s): TROPONINI in the last 168 hours. ------------------------------------------------------------------------------------------------------------------  RADIOLOGY:  Ct Pelvis W Contrast  09/22/2015  CLINICAL DATA:  Right hips sore developing on Saturday. Purulent drainage. EXAM: CT PELVIS WITH CONTRAST TECHNIQUE: Multidetector CT imaging of the pelvis was performed using the  standard protocol following the bolus administration of intravenous contrast. CONTRAST:   ISOVUE-300 IOPAMIDOL (ISOVUE-300) INJECTION 61% COMPARISON:  07/16/2015 abdominal CT FINDINGS: Skin thickening and subcutaneous fat reticulation/expansion in the right buttock with asymmetric expansion and edema involving the gluteus maximus. Towards the gluteal cleft there is more dense fluid accumulation in the subcutaneous fat without mature or rim enhancing fluid collection. No pyomyositis noted. No intra-abdominal or intrapelvic inflammatory changes are seen. No intra-abdominal inflammation noted. Fibroid uterus. Interval but healing left inferior pubic ramus fracture with displacement. There is associated medial compartment heterotopic ossification. Transverse fracture across the left acetabulum without displacement or lucency around the left hip arthroplasty. Mature periosteal reaction along the acetabular fracture as well, consistent with healing. No associated sacral fracture. 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 IMPRESSION: 1. Right gluteal cellulitis and myositis. Phlegmonous changes in the subcutaneous compartment without drainable collection. 2. Healing left inferior pubic ramus and transverse acetabular fractures. No displacement of a total left hip arthroplasty. Electronically Signed   By: Marnee Spring M.D.   On: 09/22/2015 17:15     ASSESSMENT AND PLAN:   70 year old female status post left hip arthroplasty after mechanical fall who is at rehabilitation presents today with buttock abscess status post I&D in the emergency room.  1. Buttock abscess: s/p Drainage CT scan did not show evidence of abscess. On clindamycin. wound culture pending  2. Recent arthroplasty after mechanical fall  3. Essential hypertension: Continue metoprolol.  4. Hyponatremia: On IV fluids and improving  All the records are reviewed and case discussed with Care Management/Social Workerr. Management plans discussed with the  patient, family and they are in agreement.  CODE STATUS: FULL CODE  DVT Prophylaxis: SCDs  TOTAL TIME TAKING CARE OF THIS PATIENT: 30 minutes.   POSSIBLE D/C IN 2-3 DAYS, DEPENDING ON CLINICAL CONDITION.  Milagros Loll R M.D on 09/23/2015 at 3:11 PM  Between 7am to 6pm - Pager - [phone]  After 6pm go to [email or web address] - password [email or web address] [facility]  [facility] [facility]  Office  [phone]  CC: Primary care physician; Marguarite Arbour, MD  Note: This dictation was prepared with Dragon dictation along with smaller phrase technology. Any transcriptional errors that result from this process are unintentional.

## 2015-09-23 NOTE — Plan of Care (Signed)
Problem: Safety: Goal: Ability to remain free from injury will improve Outcome: Progressing Remaining free from falls this shift.  Problem: Pain Managment: Goal: General experience of comfort will improve Outcome: Progressing Pt tolerating oral pain medication without difficulty.  Problem: Fluid Volume: Goal: Ability to maintain a balanced intake and output will improve Outcome: Progressing Remaining on iv hydration.

## 2015-09-23 NOTE — Consult Note (Signed)
WOC wound consult note Reason for Consult:Recent I & D to wound right trochanter.  Needs packing and wound care today.  Wound type:Infectious.  Pressure Ulcer POA: Yes  Area began as pressure, per family.  Resides in rehab facility.  Measurement:Superior I & D site 2 cm x 2 cm necrotic (moist slough) wound bed with linear incision in center.  Draining creamy purulent drainage when pressure applied to periwound.  Distal area of medical adhesive related skin injury (0.5 cm in diameter). Moist and pink denuded area.  Wound bed: Incision with moist slough present and purulence. CT scan was negative for abscess.  Drainage (amount, consistency, odor) Purulent creamy discharge.  Periwound:MARSI present Dressing procedure/placement/frequency:Cleanse incision site with NS.  Gently fill with Iodoform packing strip.  Moisture retentive foam dressing to wound.  Change daily.  Continue with HH to assist with wound care.  Has granddaughter that can assist with daily wound care after [facility].  Will not follow at this time.  Please re-consult if needed.  Maple HudsonKaren Izac Faulkenberry RN BSN CWON Pager [phone]

## 2015-09-23 NOTE — Progress Notes (Signed)
[facility]  Patient Status: Active  [facility] is providing the following services: SN SOC done 5/30, PT/OT not seen yet  If patient discharges after hours, please call [phone].   Scherrie GerlachJason E Hinton 09/23/2015, 10:20 AM

## 2015-09-23 NOTE — Care Management Note (Signed)
Case Management Note  Patient Details  Name: Nancy BlakeSandra S York MRN: [id number] Date of Birth: [birth year]  Subjective/Objective:                 Patient admitted with buttock abscess.  Recent hip fracture.  Recently discharged from rehab.  Patient wishes to return to home.  Patient lives at home with her husband, who will be transporting her when needed. Patient states that she has a RW, WC, and cane in the home.  [facility] with [facility] for RN, PT and OT.  Nancy York with [facility] aware of admission.  Obtains medications from [facility].    Action/Plan: Will need resumption of care orders at time of discharge.  RNCM following for discharge planning  Expected Discharge Date:  09/23/15               Expected Discharge Plan:     In-House Referral:     Discharge planning Services     Post Acute Care Choice:    Choice offered to:     DME Arranged:    DME Agency:     HH Arranged:    HH Agency:     Status of Service:     Medicare Important Message Given:    Date Medicare IM Given:    Medicare IM give by:    Date Additional Medicare IM Given:    Additional Medicare Important Message give by:     If discussed at Long Length of Stay Meetings, dates discussed:    Additional Comments:  Nancy FitchBOWEN, Nancy Wetsel T, RN 09/23/2015, 3:28 PM

## 2015-09-24 ENCOUNTER — Inpatient Hospital Stay: Payer: Commercial Managed Care - HMO | Admitting: Anesthesiology

## 2015-09-24 ENCOUNTER — Encounter: Payer: Self-pay | Admitting: Anesthesiology

## 2015-09-24 ENCOUNTER — Encounter
Admission: EM | Disposition: A | Discharge status: Home Health | Payer: Self-pay | Source: Home / Self Care | Attending: Internal Medicine

## 2015-09-24 DIAGNOSIS — L0291 Cutaneous abscess, unspecified: Secondary | ICD-10-CM

## 2015-09-24 DIAGNOSIS — L0390 Cellulitis, unspecified: Secondary | ICD-10-CM

## 2015-09-24 DIAGNOSIS — L8990 Pressure ulcer of unspecified site, unspecified stage: Secondary | ICD-10-CM | POA: Diagnosis present

## 2015-09-24 HISTORY — PX: IRRIGATION AND DEBRIDEMENT ABSCESS: SHX5252

## 2015-09-24 LAB — CBC WITH DIFFERENTIAL/PLATELET
Basophils Absolute: 0 10*3/uL (ref 0–0.1)
Basophils Relative: 0 %
Eosinophils Absolute: 0.3 10*3/uL (ref 0–0.7)
HEMATOCRIT: 31.1 % — AB (ref 35.0–47.0)
Hemoglobin: 10.1 g/dL — ABNORMAL LOW (ref 12.0–16.0)
LYMPHS ABS: 1.7 10*3/uL (ref 1.0–3.6)
MCH: 25.2 pg — AB (ref 26.0–34.0)
MCHC: 32.4 g/dL (ref 32.0–36.0)
MCV: 77.9 fL — AB (ref 80.0–100.0)
MONO ABS: 0.7 10*3/uL (ref 0.2–0.9)
Monocytes Relative: 5 %
NEUTROS ABS: 9.9 10*3/uL — AB (ref 1.4–6.5)
Neutrophils Relative %: 79 %
PLATELETS: 452 10*3/uL — AB (ref 150–440)
RBC: 3.99 MIL/uL (ref 3.80–5.20)
RDW: 19.7 % — AB (ref 11.5–14.5)
WBC: 12.6 10*3/uL — ABNORMAL HIGH (ref 3.6–11.0)

## 2015-09-24 LAB — BASIC METABOLIC PANEL
ANION GAP: 6 (ref 5–15)
BUN: 9 mg/dL (ref 6–20)
CHLORIDE: 96 mmol/L — AB (ref 101–111)
CO2: 28 mmol/L (ref 22–32)
Calcium: 8.4 mg/dL — ABNORMAL LOW (ref 8.9–10.3)
Creatinine, Ser: 0.52 mg/dL (ref 0.44–1.00)
GFR calc Af Amer: >60 mL/min (ref >60)
GFR calc non Af Amer: >60 mL/min (ref >60)
GLUCOSE: 89 mg/dL (ref 65–99)
POTASSIUM: 3.8 mmol/L (ref 3.5–5.1)
Sodium: 130 mmol/L — ABNORMAL LOW (ref 135–145)

## 2015-09-24 SURGERY — IRRIGATION AND DEBRIDEMENT ABSCESS
Anesthesia: General | Site: Hip | Laterality: Right | Wound class: Dirty or Infected

## 2015-09-24 MED ORDER — LACTATED RINGERS IV SOLN
INTRAVENOUS | Status: DC | PRN
  Administered 2015-09-24: 17:00:00 via INTRAVENOUS

## 2015-09-24 MED ORDER — VANCOMYCIN HCL IN DEXTROSE 1-5 GM/200ML-% IV SOLN
1000.0000 mg | INTRAVENOUS | Status: DC
  Administered 2015-09-25 – 2015-09-26 (×2): 1000 mg via INTRAVENOUS
  Filled 2015-09-24 (×2): qty 200

## 2015-09-24 MED ORDER — FENTANYL CITRATE (PF) 100 MCG/2ML IJ SOLN
INTRAMUSCULAR | Status: DC | PRN
  Administered 2015-09-24: 100 ug via INTRAVENOUS

## 2015-09-24 MED ORDER — PROPOFOL 10 MG/ML IV BOLUS
INTRAVENOUS | Status: DC | PRN
  Administered 2015-09-24: 130 mg via INTRAVENOUS

## 2015-09-24 MED ORDER — VANCOMYCIN HCL IN DEXTROSE 1-5 GM/200ML-% IV SOLN
1000.0000 mg | Freq: Once | INTRAVENOUS | Status: AC
  Administered 2015-09-24: 1000 mg via INTRAVENOUS
  Filled 2015-09-24: qty 200

## 2015-09-24 MED ORDER — FENTANYL CITRATE (PF) 100 MCG/2ML IJ SOLN: 25.0000 ug | INTRAMUSCULAR | Status: DC | PRN

## 2015-09-24 MED ORDER — OXYCODONE HCL 5 MG/5ML PO SOLN: 5.0000 mg | Freq: Once | ORAL | Status: DC | PRN

## 2015-09-24 MED ORDER — OXYCODONE HCL 5 MG PO TABS: 5.0000 mg | ORAL_TABLET | Freq: Once | ORAL | Status: DC | PRN

## 2015-09-24 MED ORDER — LIDOCAINE HCL (CARDIAC) 20 MG/ML IV SOLN
INTRAVENOUS | Status: DC | PRN
  Administered 2015-09-24: 100 mg via INTRAVENOUS

## 2015-09-24 SURGICAL SUPPLY — 21 items
BLADE SURG 15 STRL LF DISP TIS (BLADE) ×1 IMPLANT
BLADE SURG 15 STRL SS (BLADE) ×3
CHLORAPREP W/TINT 26ML (MISCELLANEOUS) ×3 IMPLANT
DRAPE LAPAROTOMY 100X77 ABD (DRAPES) ×3 IMPLANT
DRAPE UTILITY 15X26 TOWEL STRL (DRAPES) ×3 IMPLANT
ELECT CAUTERY BLADE 6.4 (BLADE) ×3 IMPLANT
ELECT REM PT RETURN 9FT ADLT (ELECTROSURGICAL) ×3
ELECTRODE REM PT RTRN 9FT ADLT (ELECTROSURGICAL) ×1 IMPLANT
GAUZE SPONGE 4X4 12PLY STRL (GAUZE/BANDAGES/DRESSINGS) ×3 IMPLANT
GLOVE BIO SURGEON STRL SZ8 (GLOVE) ×3 IMPLANT
GOWN STRL REUS W/ TWL LRG LVL3 (GOWN DISPOSABLE) ×2 IMPLANT
GOWN STRL REUS W/TWL LRG LVL3 (GOWN DISPOSABLE) ×6
KIT RM TURNOVER STRD PROC AR (KITS) ×3 IMPLANT
LABEL OR SOLS (LABEL) ×3 IMPLANT
NDL HYPO 25X1 1.5 SAFETY (NEEDLE) ×1 IMPLANT
NEEDLE HYPO 25X1 1.5 SAFETY (NEEDLE) ×3 IMPLANT
NS IRRIG 500ML POUR BTL (IV SOLUTION) ×3 IMPLANT
PACK BASIN MINOR ARMC (MISCELLANEOUS) ×3 IMPLANT
SPONGE LAP 18X18 5 PK (GAUZE/BANDAGES/DRESSINGS) ×3 IMPLANT
SYR BULB EAR ULCER 3OZ GRN STR (SYRINGE) ×3 IMPLANT
SYRINGE 10CC LL (SYRINGE) ×3 IMPLANT

## 2015-09-24 NOTE — Progress Notes (Signed)
Alert and oriented, up to bsc with minimal assist, surgery consulted for purulent drainage on right hip abscess, sent to OR for I&D, husband at bedside, denies pain, iv infiltrated in left hand, ice applied, patient denies pain in left hand. New IV started with ultrasound in right A/C. Receiving Iv antibiotics, afebrile.

## 2015-09-24 NOTE — Progress Notes (Signed)
Preoperative Review   Patient is met in the preoperative holding area. The history is reviewed in the chart and with the patient. I personally reviewed the options and rationale as well as the risks of this procedure that have been previously discussed with the patient. All questions asked by the patient and/or family were answered to their satisfaction. Patient marked on the right hip Patient agrees to proceed with this procedure at this time.  Florene Glen M.D. FACS

## 2015-09-24 NOTE — Transfer of Care (Signed)
Immediate Anesthesia Transfer of Care Note  Patient: Nancy BlakeSandra S York  Procedure(s) Performed: Procedure(s): IRRIGATION AND DEBRIDEMENT OF RIGHT HIP ABSCESS (Right)  Patient Location: PACU  Anesthesia Type:General  Level of Consciousness: awake, alert , oriented and patient cooperative  Airway & Oxygen Therapy: Patient Spontanous Breathing and Patient connected to nasal cannula oxygen  Post-op Assessment: Report given to RN and Post -op Vital signs reviewed and stable  Post vital signs: Reviewed and stable  Last Vitals:  Filed Vitals:   09/24/15 1542 09/24/15 1750  BP: 122/62   Pulse: 89 76  Temp: 36.6 C 36.6 C  Resp: 16     Last Pain:  Filed Vitals:   09/24/15 1751  PainSc: 0-No pain         Complications: No apparent anesthesia complications

## 2015-09-24 NOTE — Anesthesia Preprocedure Evaluation (Signed)
Anesthesia Evaluation  Patient identified by MRN, date of birth, ID band Patient awake    Reviewed: Allergy & Precautions, H&P , NPO status , Patient's Chart, lab work & pertinent test results  History of Anesthesia Complications Negative for: history of anesthetic complications  Airway Mallampati: III  TM Distance: <3 FB Neck ROM: limited    Dental  (+) Poor Dentition, Chipped, Missing, Partial Upper   Pulmonary neg pulmonary ROS, neg shortness of breath,    Pulmonary exam normal breath sounds clear to auscultation       Cardiovascular Exercise Tolerance: Good hypertension, (-) angina(-) Past MI and (-) DOE Normal cardiovascular exam Rhythm:regular Rate:Normal     Neuro/Psych negative neurological ROS  negative psych ROS   GI/Hepatic Neg liver ROS, GERD  Controlled,  Endo/Other  negative endocrine ROS  Renal/GU negative Renal ROS  negative genitourinary   Musculoskeletal   Abdominal   Peds  Hematology negative hematology ROS (+)   Anesthesia Other Findings Past Medical History:   Hypertension                                                 GERD (gastroesophageal reflux disease)                       Diverticulosis                                               Heart murmur                                                   Comment:no doctor follows her for this   Arthritis                                                      Comment:joints, shoulder and knees  Past Surgical History:   TONSILLECTOMY                                                 DILATION AND CURETTAGE OF UTERUS                              ROTATOR CUFF REPAIR                             Right 2006           Comment:Dr. Califf    KNEE ARTHROSCOPY                                Left              BILATERAL CARPAL TUNNEL  RELEASE                 Bilateral              TOTAL KNEE ARTHROPLASTY                         Right 08/25/2014    Comment:Dr. Reita ChardChris Smith   FRACTURE SURGERY                                Left 2007           Comment:fractured left  hip   JOINT REPLACEMENT                               Right 08/2014         Comment:total knee replacement   TOTAL HIP ARTHROPLASTY                          Left 06/24/2015       Comment:Procedure: TOTAL HIP ARTHROPLASTY;  Surgeon:               Donato HeinzJames P Hooten, MD;  Location: [facility] ORS;                Service: Orthopedics;  Laterality: Left;   HARDWARE REMOVAL                                Left 06/24/2015       Comment:Procedure: HARDWARE REMOVAL/ DHS;  Surgeon:               Donato HeinzJames P Hooten, MD;  Location: [facility] ORS;                Service: Orthopedics;  Laterality: Left;  BMI    Body Mass Index   27.06 kg/m 2      Reproductive/Obstetrics negative OB ROS                             Anesthesia Physical Anesthesia Plan  ASA: III  Anesthesia Plan: General LMA   Post-op Pain Management:    Induction:   Airway Management Planned:   Additional Equipment:   Intra-op Plan:   Post-operative Plan:   Informed Consent: I have reviewed the patients History and Physical, chart, labs and discussed the procedure including the risks, benefits and alternatives for the proposed anesthesia with the patient or authorized representative who has indicated his/her understanding and acceptance.   Dental Advisory Given  Plan Discussed with: Anesthesiologist, CRNA and Surgeon  Anesthesia Plan Comments:         Anesthesia Quick Evaluation

## 2015-09-24 NOTE — Progress Notes (Signed)
[facility] [place] [facility]   PATIENT NAME: Nancy SchaumannSandra York    MR#:  [id number]  DATE OF BIRTH:  04-30-1945  SUBJECTIVE:  CHIEF COMPLAINT:   Chief Complaint  Patient presents with  . Abscess   No pain. No shortness of breath or fever or vomiting or abdominal pain.  REVIEW OF SYSTEMS:    Review of Systems  Constitutional: Negative for fever and chills.  HENT: Negative for sore throat.   Eyes: Negative for blurred vision, double vision and pain.  Respiratory: Negative for cough, hemoptysis, shortness of breath and wheezing.   Cardiovascular: Negative for chest pain, palpitations, orthopnea and leg swelling.  Gastrointestinal: Negative for heartburn, nausea, vomiting, abdominal pain, diarrhea and constipation.  Genitourinary: Negative for dysuria and hematuria.  Musculoskeletal: Positive for back pain. Negative for joint pain.  Skin: Negative for rash.  Neurological: Positive for weakness. Negative for headaches.    DRUG ALLERGIES:   Allergies  Allergen Reactions  . Statins Other (See Comments)    Reaction:  Leg cramps   . Penicillins Rash and Other (See Comments)    Has patient had a PCN reaction causing immediate rash, facial/tongue/throat swelling, SOB or lightheadedness with hypotension: No Has patient had a PCN reaction causing severe rash involving mucus membranes or skin necrosis: No Has patient had a PCN reaction that required hospitalization No Has patient had a PCN reaction occurring within the last 10 years: Yes If all of the above answers are "NO", then may proceed with Cephalosporin use.  . Tape Rash    VITALS:  Blood pressure 108/50, pulse 92, temperature 97.7 F (36.5 C), temperature source Oral, resp. rate 18, height 5\' 2"  (1.575 m), weight 67.132 kg (148 lb), SpO2 97 %.  PHYSICAL EXAMINATION:   Physical Exam  GENERAL:  70 y.o.-year-old patient lying in the bed with no acute distress.  EYES: Pupils equal, round,  reactive to light and accommodation. No scleral icterus. Extraocular muscles intact.  HEENT: Head atraumatic, normocephalic. Oropharynx and nasopharynx clear.  NECK:  Supple, no jugular venous distention. No thyroid enlargement, no tenderness.  LUNGS: Normal breath sounds bilaterally, no wheezing, rales, rhonchi. No use of accessory muscles of respiration.  CARDIOVASCULAR: S1, S2 normal. No murmurs, rubs, or gallops.  ABDOMEN: Soft, nontender, nondistended. Bowel sounds present. No organomegaly or mass.  EXTREMITIES: No cyanosis, clubbing or edema b/l.    NEUROLOGIC: Cranial nerves II through XII are intact. Moves all 4 extremities.   PSYCHIATRIC: The patient is alert and oriented x 3.  SKIN: Sacral pressure ulcer. Right gluteal incision site with significant purulent discharge. No fluctuance noticed.   LABORATORY PANEL:   CBC  Recent Labs Lab 09/24/15 0716  WBC 12.6*  HGB 10.1*  HCT 31.1*  PLT 452*   ------------------------------------------------------------------------------------------------------------------ Chemistries   Recent Labs Lab 09/22/15 1445  09/24/15 0716  NA 127*  < > 130*  K 4.7  < > 3.8  CL 89*  < > 96*  CO2 27  < > 28  GLUCOSE 114*  < > 89  BUN 20  < > 9  CREATININE 1.02*  < > 0.52  CALCIUM 8.8*  < > 8.4*  AST 33  --   --   ALT 27  --   --   ALKPHOS 138*  --   --   BILITOT 0.5  --   --   < > = values in this interval not displayed. ------------------------------------------------------------------------------------------------------------------  Cardiac Enzymes No results for input(s): TROPONINI  in the last 168 hours. ------------------------------------------------------------------------------------------------------------------  RADIOLOGY:  Ct Pelvis W Contrast  09/22/2015  CLINICAL DATA:  Right hips sore developing on Saturday. Purulent drainage. EXAM: CT PELVIS WITH CONTRAST TECHNIQUE: Multidetector CT imaging of the pelvis was performed  using the standard protocol following the bolus administration of intravenous contrast. CONTRAST:  ISOVUE-300 IOPAMIDOL (ISOVUE-300) INJECTION 61% COMPARISON:  07/16/2015 abdominal CT FINDINGS: Skin thickening and subcutaneous fat reticulation/expansion in the right buttock with asymmetric expansion and edema involving the gluteus maximus. Towards the gluteal cleft there is more dense fluid accumulation in the subcutaneous fat without mature or rim enhancing fluid collection. No pyomyositis noted. No intra-abdominal or intrapelvic inflammatory changes are seen. No intra-abdominal inflammation noted. Fibroid uterus. Interval but healing left inferior pubic ramus fracture with displacement. There is associated medial compartment heterotopic ossification. Transverse fracture across the left acetabulum without displacement or lucency around the left hip arthroplasty. Mature periosteal reaction along the acetabular fracture as well, consistent with healing. No associated sacral fracture. 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 IMPRESSION: 1. Right gluteal cellulitis and myositis. Phlegmonous changes in the subcutaneous compartment without drainable collection. 2. Healing left inferior pubic ramus and transverse acetabular fractures. No displacement of a total left hip arthroplasty. Electronically Signed   By: Marnee Spring M.D.   On: 09/22/2015 17:15     ASSESSMENT AND PLAN:   70 year old female status post left hip arthroplasty after mechanical fall who is at rehabilitation presents today with buttock abscess status post I&D in the emergency room.  1. Right Buttock abscess: s/p Drainage CT scan did not show evidence of abscess. On clindamycin. wound culture showing Staph. Sens pending. Patient still has significant purulent discharge. Discussed with Dr. Excell Seltzer of surgery for debridement. We'll change clindamycin to  vancomycin.  2. Recent arthroplasty after mechanical fall  3. Essential hypertension: Continue metoprolol.  4. Hyponatremia: On IV fluids and improving  All the records are reviewed and case discussed with Care Management/Social Workerr. Management plans discussed with the patient, family and they are in agreement.  CODE STATUS: FULL CODE  DVT Prophylaxis: SCDs  TOTAL TIME TAKING CARE OF THIS PATIENT: 30 minutes.   POSSIBLE D/C IN 2-3 DAYS, DEPENDING ON CLINICAL CONDITION.  Milagros Loll R M.D on 09/24/2015 at 12:18 PM  Between 7am to 6pm - Pager - [phone]  After 6pm go to [email or web address] - password [email or web address] [facility]  [facility]  Office  [phone]  CC: Primary care physician; Marguarite Arbour, MD  Note: This dictation was prepared with Dragon dictation along with smaller phrase technology. Any transcriptional errors that result from this process are unintentional.

## 2015-09-24 NOTE — Progress Notes (Signed)
Pharmacy Antibiotic Note  Nancy York is a 70 y.o. female admitted on 09/22/2015 with cellulitis.  Pharmacy has been consulted for vancomycin dosing.  This is day #3 of antibiotics. Patient has been receiving clindamycin but has significant purulent discharge. Antibiotics being changed to vancomycin.  Plan: Order vancomycin 1000 mg dose x 1 this afternoon followed by vancomycin 1000 mg IV q24h (stacked dose to begin 13 hours after initial dose @ 0300 tomorrow). Goal vancomycin trough 10-15 mcg/mL Vanc trough scheduled for 6/4 @ 0230  Kinetics: Using adjusted body weight of 57 kg Ke: 0.053 Half-life: 13 hrs Vd: 40 L Cmin (calculated): 11.5 mcg/mL  Height: 5\' 2"  (157.5 cm) Weight: 148 lb (67.132 kg) IBW/kg (Calculated) : 50.1  Temp (24hrs), Avg:97.8 F (36.6 C), Min:97.6 F (36.4 C), Max:98.1 F (36.7 C)   Recent Labs Lab 09/22/15 1445 09/22/15 1610 09/23/15 0647 09/24/15 0716  WBC 16.2*  --  16.8* 12.6*  CREATININE 1.02*  --  0.70 0.52  LATICACIDVEN  --  1.6  --   --     Estimated Creatinine Clearance: 57.9 mL/min (by C-G formula based on Cr of 0.52).    Allergies  Allergen Reactions  . Statins Other (See Comments)    Reaction:  Leg cramps   . Penicillins Rash and Other (See Comments)    Has patient had a PCN reaction causing immediate rash, facial/tongue/throat swelling, SOB or lightheadedness with hypotension: No Has patient had a PCN reaction causing severe rash involving mucus membranes or skin necrosis: No Has patient had a PCN reaction that required hospitalization No Has patient had a PCN reaction occurring within the last 10 years: Yes If all of the above answers are "NO", then may proceed with Cephalosporin use.  . Tape Rash   Antimicrobials this admission: clindamycin 5/31 >> 6/1 vancomycin 6/1 >>   Dose adjustments this admission:  Microbiology results: 5/30 BCx: Pending 5/30 Wound: staph aureus, sensitivies pending  5/30 MRSA PCR:  positive  Thank you for allowing pharmacy to be a part of this patient's care.  Cindi CarbonMary M Jazzmon Prindle, PharmD Clinical Pharmacist 09/24/2015 12:35 PM

## 2015-09-24 NOTE — Anesthesia Postprocedure Evaluation (Signed)
Anesthesia Post Note  Patient: Nancy BlakeSandra S York  Procedure(s) Performed: Procedure(s) (LRB): IRRIGATION AND DEBRIDEMENT OF RIGHT HIP ABSCESS (Right)  Patient location during evaluation: PACU Anesthesia Type: General Level of consciousness: awake and alert Pain management: pain level controlled Vital Signs Assessment: post-procedure vital signs reviewed and stable Respiratory status: spontaneous breathing, nonlabored ventilation, respiratory function stable and patient connected to nasal cannula oxygen Cardiovascular status: blood pressure returned to baseline and stable Postop Assessment: no signs of nausea or vomiting Anesthetic complications: no    Last Vitals:  Filed Vitals:   09/24/15 1835 09/24/15 1846  BP: 132/61 129/52  Pulse: 79 78  Temp: 36.2 C 36.7 C  Resp: 18 18    Last Pain:  Filed Vitals:   09/24/15 1855  PainSc: 0-No pain                 Cleda MccreedyJoseph K Machel Violante

## 2015-09-24 NOTE — Consult Note (Signed)
Surgical Consultation  09/24/2015  Nancy York is an 70 y.o. female.   CC: Right hip abscess  HPI: This patient experienced a fall and fractured pelvis who has had a recent left hip replacement but with her fall she developed a hematoma on the right side which developed into an abscess and was drained in the emergency room it continues to drain copious amounts of purulence in spite of a CT scan showing no abscess cavity that was done on May 30. I was asked see the patient for continued drainage from this right hip abscess. He has grown MRSA she denies fevers or chills  Past Medical History  Diagnosis Date  . Hypertension   . GERD (gastroesophageal reflux disease)   . Diverticulosis   . Heart murmur     no doctor follows her for this  . Arthritis     joints, shoulder and knees    Past Surgical History  Procedure Laterality Date  . Tonsillectomy    . Dilation and curettage of uterus    . Rotator cuff repair Right 2006    Dr. Mauri Pole   . Knee arthroscopy Left   . Bilateral carpal tunnel release Bilateral   . 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 Margaretmary Eddy  . Fracture surgery Left 2007    fractured left  hip  . Joint replacement Right 08/2014    total knee replacement  . Total hip arthroplasty Left 06/24/2015    Procedure: TOTAL HIP ARTHROPLASTY;  Surgeon: Dereck Leep, MD;  Location: [facility] ORS;  Service: Orthopedics;  Laterality: Left;  . Hardware removal Left 06/24/2015    Procedure: HARDWARE REMOVAL/ DHS;  Surgeon: Dereck Leep, MD;  Location: [facility] ORS;  Service: Orthopedics;  Laterality: Left;    Family History  Problem Relation Age of Onset  . Breast cancer Neg Hx   . Diabetes Father     Social History:  reports that she has never smoked. She has never used smokeless tobacco. She reports that she does not drink alcohol or use illicit drugs.  Allergies:  Allergies  Allergen Reactions  . Statins Other (See Comments)    Reaction:  Leg cramps   .  Penicillins Rash and Other (See Comments)    Has patient had a PCN reaction causing immediate rash, facial/tongue/throat swelling, SOB or lightheadedness with hypotension: No Has patient had a PCN reaction causing severe rash involving mucus membranes or skin necrosis: No Has patient had a PCN reaction that required hospitalization No Has patient had a PCN reaction occurring within the last 10 years: Yes If all of the above answers are "NO", then may proceed with Cephalosporin use.  . Tape Rash    Medications reviewed.   Review of Systems:   Review of Systems  Constitutional: Negative for fever and chills.  HENT: Negative.   Eyes: Negative.   Respiratory: Negative.   Cardiovascular: Negative.   Gastrointestinal: Negative.   Genitourinary: Negative.   Musculoskeletal: Positive for falls.       Right hip abscess  Skin: Negative.   Neurological: Negative.   Endo/Heme/Allergies: Negative.   Psychiatric/Behavioral: Negative.      Physical Exam:  BP 108/50 mmHg  Pulse 92  Temp(Src) 97.7 F (36.5 C) (Oral)  Resp 18  Ht '5\' 2"'$  (1.575 m)  Wt 148 lb (67.132 kg)  BMI 27.06 kg/m2  SpO2 97%  Physical Exam  Constitutional: She is oriented to person, place, and time and well-developed, well-nourished, and in no  distress. No distress.  HENT:  Head: Normocephalic and atraumatic.  Eyes: Pupils are equal, round, and reactive to light. Right eye exhibits no discharge. Left eye exhibits no discharge. No scleral icterus.  Neck: Normal range of motion.  Cardiovascular: Normal rate, regular rhythm and normal heart sounds.   Pulmonary/Chest: Effort normal and breath sounds normal. No respiratory distress.  Abdominal: Soft. She exhibits no distension. There is no tenderness.  Musculoskeletal:  Right hip wound with erythema and considerable drainage of purulent material with minimal tenderness  Lymphadenopathy:    She has no cervical adenopathy.  Neurological: She is alert and oriented to  person, place, and time.  Skin: Skin is warm. No rash noted. She is not diaphoretic. There is erythema. No pallor.  Psychiatric: Mood and affect normal.  Vitals reviewed.     Results for orders placed or performed during the hospital encounter of 09/22/15 (from the past 48 hour(s))  Urinalysis complete, with microscopic     Status: Abnormal   Collection Time: 09/22/15  2:44 PM  Result Value Ref Range   Color, Urine YELLOW (A) YELLOW   APPearance CLOUDY (A) CLEAR   Glucose, UA NEGATIVE NEGATIVE mg/dL   Bilirubin Urine NEGATIVE NEGATIVE   Ketones, ur NEGATIVE NEGATIVE mg/dL   Specific Gravity, Urine >1.060 (H) 1.005 - 1.030   Hgb urine dipstick 2+ (A) NEGATIVE   pH 7.0 5.0 - 8.0   Protein, ur 100 (A) NEGATIVE mg/dL   Nitrite NEGATIVE NEGATIVE   Leukocytes, UA 2+ (A) NEGATIVE   RBC / HPF 6-30 0 - 5 RBC/hpf   WBC, UA 6-30 0 - 5 WBC/hpf   Bacteria, UA MANY (A) NONE SEEN   Squamous Epithelial / LPF TOO NUMEROUS TO COUNT (A) NONE SEEN  Culture, blood (routine x 2)     Status: None (Preliminary result)   Collection Time: 09/22/15  2:44 PM  Result Value Ref Range   Specimen Description BLOOD RIGHT ASSIST CONTROL    Special Requests BOTTLES DRAWN AEROBIC AND ANAEROBIC  15CC    Culture NO GROWTH < 24 HOURS    Report Status PENDING   Culture, blood (routine x 2)     Status: None (Preliminary result)   Collection Time: 09/22/15  2:44 PM  Result Value Ref Range   Specimen Description BLOOD    Special Requests AER 5CC ANA 5CC    Culture NO GROWTH < 24 HOURS    Report Status PENDING   Comprehensive metabolic panel     Status: Abnormal   Collection Time: 09/22/15  2:45 PM  Result Value Ref Range   Sodium 127 (L) 135 - 145 mmol/L   Potassium 4.7 3.5 - 5.1 mmol/L   Chloride 89 (L) 101 - 111 mmol/L   CO2 27 22 - 32 mmol/L   Glucose, Bld 114 (H) 65 - 99 mg/dL   BUN 20 6 - 20 mg/dL   Creatinine, Ser 3.15 (H) 0.44 - 1.00 mg/dL   Calcium 8.8 (L) 8.9 - 10.3 mg/dL   Total Protein 6.9 6.5 -  8.1 g/dL   Albumin 2.3 (L) 3.5 - 5.0 g/dL   AST 33 15 - 41 U/L   ALT 27 14 - 54 U/L   Alkaline Phosphatase 138 (H) 38 - 126 U/L   Total Bilirubin 0.5 0.3 - 1.2 mg/dL   GFR calc non Af Amer 54 (L) >60 mL/min   GFR calc Af Amer >60 >60 mL/min    Comment: (NOTE) The eGFR has been calculated using  the CKD EPI equation. This calculation has not been validated in all clinical situations. eGFR's persistently <60 mL/min signify possible Chronic Kidney Disease.    Anion gap 11 5 - 15  CBC with Differential     Status: Abnormal   Collection Time: 09/22/15  2:45 PM  Result Value Ref Range   WBC 16.2 (H) 3.6 - 11.0 K/uL   RBC 4.45 3.80 - 5.20 MIL/uL   Hemoglobin 11.2 (L) 12.0 - 16.0 g/dL   HCT 35.1 35.0 - 47.0 %   MCV 79.0 (L) 80.0 - 100.0 fL   MCH 25.1 (L) 26.0 - 34.0 pg   MCHC 31.8 (L) 32.0 - 36.0 g/dL   RDW 19.6 (H) 11.5 - 14.5 %   Platelets 493 (H) 150 - 440 K/uL   Neutrophils Relative % 78 %   Lymphocytes Relative 15 %   Monocytes Relative 2 %   Eosinophils Relative 0 %   Basophils Relative 0 %   Band Neutrophils 5 %   Metamyelocytes Relative 0 %   Myelocytes 0 %   Promyelocytes Absolute 0 %   Blasts 0 %   nRBC 0 0 /100 WBC   Other 0 %   Neutro Abs 13.5 (H) 1.4 - 6.5 K/uL   Lymphs Abs 2.4 1.0 - 3.6 K/uL   Monocytes Absolute 0.3 0.2 - 0.9 K/uL   Eosinophils Absolute 0.0 0 - 0.7 K/uL   Basophils Absolute 0.0 0 - 0.1 K/uL   RBC Morphology MORPHOLOGY UNREMARKABLE   Lactic acid, plasma     Status: None   Collection Time: 09/22/15  4:10 PM  Result Value Ref Range   Lactic Acid, Venous 1.6 0.5 - 2.0 mmol/L  MRSA PCR Screening     Status: Abnormal   Collection Time: 09/22/15 10:43 PM  Result Value Ref Range   MRSA by PCR POSITIVE (A) NEGATIVE    Comment:        The GeneXpert MRSA Assay (FDA approved for NASAL specimens only), is one component of a comprehensive MRSA colonization surveillance program. It is not intended to diagnose MRSA infection nor to guide or monitor  treatment for MRSA infections. CRITICAL RESULT CALLED TO, READ BACK BY AND VERIFIED WITH: MARY RAYMOND @ 0012 ON 09/23/2015 BY CAF   WOUND CULTURE ([facility] ONLY)     Status: None (Preliminary result)   Collection Time: 09/22/15 10:45 PM  Result Value Ref Range   Specimen Description ABSCESS    Special Requests Normal    Gram Stain RARE WBC SEEN RARE GRAM POSITIVE COCCI     Culture      MODERATE GROWTH STAPHYLOCOCCUS AUREUS SUSCEPTIBILITIES TO FOLLOW    Report Status PENDING   Basic metabolic panel     Status: Abnormal   Collection Time: 09/23/15  6:47 AM  Result Value Ref Range   Sodium 129 (L) 135 - 145 mmol/L   Potassium 4.6 3.5 - 5.1 mmol/L   Chloride 94 (L) 101 - 111 mmol/L   CO2 27 22 - 32 mmol/L   Glucose, Bld 106 (H) 65 - 99 mg/dL   BUN 16 6 - 20 mg/dL   Creatinine, Ser 0.70 0.44 - 1.00 mg/dL   Calcium 8.5 (L) 8.9 - 10.3 mg/dL   GFR calc non Af Amer >60 >60 mL/min   GFR calc Af Amer >60 >60 mL/min    Comment: (NOTE) The eGFR has been calculated using the CKD EPI equation. This calculation has not been validated in all clinical situations. eGFR's persistently <  60 mL/min signify possible Chronic Kidney Disease.    Anion gap 8 5 - 15  CBC     Status: Abnormal   Collection Time: 09/23/15  6:47 AM  Result Value Ref Range   WBC 16.8 (H) 3.6 - 11.0 K/uL   RBC 3.95 3.80 - 5.20 MIL/uL   Hemoglobin 10.0 (L) 12.0 - 16.0 g/dL   HCT 30.9 (L) 35.0 - 47.0 %   MCV 78.2 (L) 80.0 - 100.0 fL   MCH 25.3 (L) 26.0 - 34.0 pg   MCHC 32.4 32.0 - 36.0 g/dL   RDW 19.4 (H) 11.5 - 14.5 %   Platelets 435 150 - 440 K/uL  Basic metabolic panel     Status: Abnormal   Collection Time: 09/24/15  7:16 AM  Result Value Ref Range   Sodium 130 (L) 135 - 145 mmol/L   Potassium 3.8 3.5 - 5.1 mmol/L   Chloride 96 (L) 101 - 111 mmol/L   CO2 28 22 - 32 mmol/L   Glucose, Bld 89 65 - 99 mg/dL   BUN 9 6 - 20 mg/dL   Creatinine, Ser 0.52 0.44 - 1.00 mg/dL   Calcium 8.4 (L) 8.9 - 10.3 mg/dL   GFR calc  non Af Amer >60 >60 mL/min   GFR calc Af Amer >60 >60 mL/min    Comment: (NOTE) The eGFR has been calculated using the CKD EPI equation. This calculation has not been validated in all clinical situations. eGFR's persistently <60 mL/min signify possible Chronic Kidney Disease.    Anion gap 6 5 - 15  CBC with Differential/Platelet     Status: Abnormal   Collection Time: 09/24/15  7:16 AM  Result Value Ref Range   WBC 12.6 (H) 3.6 - 11.0 K/uL   RBC 3.99 3.80 - 5.20 MIL/uL   Hemoglobin 10.1 (L) 12.0 - 16.0 g/dL   HCT 31.1 (L) 35.0 - 47.0 %   MCV 77.9 (L) 80.0 - 100.0 fL   MCH 25.2 (L) 26.0 - 34.0 pg   MCHC 32.4 32.0 - 36.0 g/dL   RDW 19.7 (H) 11.5 - 14.5 %   Platelets 452 (H) 150 - 440 K/uL   Neutrophils Relative % 79% %   Neutro Abs 9.9 (H) 1.4 - 6.5 K/uL   Lymphocytes Relative 14% %   Lymphs Abs 1.7 1.0 - 3.6 K/uL   Monocytes Relative 5% %   Monocytes Absolute 0.7 0.2 - 0.9 K/uL   Eosinophils Relative 2% %   Eosinophils Absolute 0.3 0 - 0.7 K/uL   Basophils Relative 0% %   Basophils Absolute 0.0 0 - 0.1 K/uL   Ct Pelvis W Contrast  09/22/2015  CLINICAL DATA:  Right hips sore developing on Saturday. Purulent drainage. EXAM: CT PELVIS WITH CONTRAST TECHNIQUE: Multidetector CT imaging of the pelvis was performed using the standard protocol following the bolus administration of intravenous contrast. CONTRAST:  167m ISOVUE-300 IOPAMIDOL (ISOVUE-300) INJECTION 61% COMPARISON:  07/16/2015 abdominal CT FINDINGS: Skin thickening and subcutaneous fat reticulation/expansion in the right buttock with asymmetric expansion and edema involving the gluteus maximus. Towards the gluteal cleft there is more dense fluid accumulation in the subcutaneous fat without mature or rim enhancing fluid collection. No pyomyositis noted. No intra-abdominal or intrapelvic inflammatory changes are seen. No intra-abdominal inflammation noted. Fibroid uterus. Interval but healing left inferior pubic ramus fracture with  displacement. There is associated medial compartment heterotopic ossification. Transverse fracture across the left acetabulum without displacement or lucency around the left hip arthroplasty. Mature periosteal  reaction along the acetabular fracture as well, consistent with healing. No associated sacral fracture. 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 IMPRESSION: 1. Right gluteal cellulitis and myositis. Phlegmonous changes in the subcutaneous compartment without drainable collection. 2. Healing left inferior pubic ramus and transverse acetabular fractures. No displacement of a total left hip arthroplasty. Electronically Signed   By: Monte Fantasia M.D.   On: 09/22/2015 17:15    Assessment/Plan:  Right hip abscess which is partially drained but continues to drain purulent material with MRSA positivity. Recommend going to the operating room and opening this up more formally and probably placing Penrose drains etc. the rationale for this is discussed with the patient and her husband the options of observation reviewed the risk bleeding infection recurrence and the need for further surgery were all reviewed with them they understood and agreed to proceed  Should 8 a full breakfast therefore this will be done later today or tonight.  Florene Glen, MD, FACS

## 2015-09-24 NOTE — Op Note (Signed)
09/22/2015 - 09/24/2015  5:47 PM  PATIENT:  Charlett BlakeSandra S Gowans  70 y.o. female  PRE-OPERATIVE DIAGNOSIS: Right hip abscess  POST-OPERATIVE DIAGNOSIS:  Right sub-gluteal hip abscess  PROCEDURE: Incise and drain right sub-gluteal abscess  SURGEON:  Lattie Hawichard E Meela Wareing MD, FACS   ANESTHESIA:   Gen. with LMA   Details of Procedure: This patient with a right hip abscess which is been drained in the ED but continues to drain copious amounts of purulent material and she is positive for MRSA she requires more formal drainage. We discussed rationale for surgery the options of observation risk bleeding infection recurrence and need for further surgery drain placement etc. she understood and agreed to proceed  Findings: Deep abscess intraoral or sub-gluteal did not appear to be in contact with bone. Cultures were taken Penrose were placed into the deep cavity  Patient was identified and properly marked and then induced to general anesthesia prepped draped sterile fashion a surgical post was performed and then cultures were taken after she was prepped and draped in a sterile fashion. Probing of the wound demonstrated no superficial pocket but entry into the gluteal area demonstrated a large pocket deep to or intragluteal. He is area was aspirated with a Yankauer suction catheter and irrigated with copious metastases normal saline. 2 Penrose drains were placed into this deep cavity and tied in with 3-0 nylon. Sterile dressings were placed  Patient out of the procedure well the workup occasions she was taken to recovery room in stable condition to be admitted for continued care Dionne Miloichard Kathrynn Backstrom on Florene RouteSandra Coachman   Teshara Moree E Serita Degroote, MD FACS

## 2015-09-24 NOTE — Progress Notes (Signed)
Upon arrival from OR vital signs are stable and dressing is dry and intact, unable to visualize penrose drains.

## 2015-09-24 NOTE — Anesthesia Procedure Notes (Signed)
Procedure Name: LMA Insertion Date/Time: 09/24/2015 5:23 PM Performed by: Irving BurtonBACHICH, Eidan Muellner Pre-anesthesia Checklist: Patient identified, Emergency Drugs available, Suction available and Patient being monitored Patient Re-evaluated:Patient Re-evaluated prior to inductionOxygen Delivery Method: Circle system utilized Preoxygenation: Pre-oxygenation with 100% oxygen Intubation Type: IV induction LMA: LMA inserted LMA Size: 3.5 Number of attempts: 1 Placement Confirmation: positive ETCO2 Tube secured with: Tape Dental Injury: Teeth and Oropharynx as per pre-operative assessment

## 2015-09-25 ENCOUNTER — Other Ambulatory Visit: Payer: Self-pay | Admitting: *Deleted

## 2015-09-25 ENCOUNTER — Encounter: Payer: Self-pay | Admitting: Surgery

## 2015-09-25 ENCOUNTER — Ambulatory Visit: Payer: Self-pay | Admitting: *Deleted

## 2015-09-25 NOTE — Consult Note (Signed)
   [facility]   09/25/2015  Charlett BlakeSandra S Minturn 26-Dec-1945 [id number]   [facility] chronic disease management services.  [facility] Clinical Licensed Social Worker during her recent SNF stay. Our community based plan of care has focused on disease management and community resource support.  Patient will receive a post discharge transition of care calls and will be evaluated for monthly home visits for assessments and disease process education. Will make Inpatient Case Manager aware that [facility] following. Of note, [facility] services does not replace or interfere with any services that are needed or arranged by inpatient case management or social work.  For additional questions or referrals please contact:  Avagail Whittlesey RN, BSN [facility]  Hospital Liaison  [phone]) Business Mobile [phone]) Toll free office

## 2015-09-25 NOTE — Addendum Note (Signed)
Addended by: Wenda OverlandLAND, Zianna Dercole M on: 09/25/2015 03:22 PM   Modules accepted: Orders

## 2015-09-25 NOTE — Plan of Care (Signed)
Problem: Activity: Goal: Risk for activity intolerance will decrease Outcome: Progressing Up [facility].   Problem: Nutrition: Goal: Adequate nutrition will be maintained Outcome: Progressing Tolerated a regular diet for lunch

## 2015-09-25 NOTE — Progress Notes (Signed)
Patient is A&O x4, up with [facility] to [facility] and chair. Immobilizer to left leg. Dressing to right buttocks are changed x2 this shift. Penrose drains in place. Purulent drainage, MD aware. NSL in place to [facility]. LBM this shift. Gave tylenol x1, this good relief.

## 2015-09-25 NOTE — Care Management (Signed)
This [facility]. [facility].

## 2015-09-25 NOTE — Progress Notes (Signed)
1 Day Post-Op  Subjective: Status post I&D of a right buttock abscess Patient has no complaints states that she feels much better today  Objective: Vital signs in last 24 hours: Temp:  [96.8 F (36 C)-98 F (36.7 C)] 97.5 F (36.4 C) (06/02 0753) Pulse Rate:  [76-90] 90 (06/02 0941) Resp:  [15-19] 18 (06/02 0753) BP: (111-140)/(52-62) 140/54 mmHg (06/02 0941) SpO2:  [92 %-100 %] 100 % (06/02 0753) Last BM Date: 09/22/15  Intake/Output from previous day: 06/01 0701 - 06/02 0700 In: 1640 [P.O.:840; I.V.:400; IV Piggyback:400] Out: 1 [Blood:1] Intake/Output this shift:    Physical exam:  Considerable purulent drainage around Penrose drains less erythema nontender  Lab Results: CBC   Recent Labs  09/23/15 0647 09/24/15 0716  WBC 16.8* 12.6*  HGB 10.0* 10.1*  HCT 30.9* 31.1*  PLT 435 452*   BMET  Recent Labs  09/23/15 0647 09/24/15 0716  NA 129* 130*  K 4.6 3.8  CL 94* 96*  CO2 27 28  GLUCOSE 106* 89  BUN 16 9  CREATININE 0.70 0.52  CALCIUM 8.5* 8.4*   PT/INR No results for input(s): LABPROT, INR in the last 72 hours. ABG No results for input(s): PHART, HCO3 in the last 72 hours.  Invalid input(s): PCO2, PO2  Studies/Results: No results found.  Anti-infectives: Anti-infectives    Start     Dose/Rate Route Frequency Ordered Stop   09/25/15 0300  vancomycin (VANCOCIN) IVPB 1000 mg/200 mL premix     1,000 mg 200 mL/hr over 60 Minutes Intravenous Every 24 hours 09/24/15 1233     09/24/15 1330  vancomycin (VANCOCIN) IVPB 1000 mg/200 mL premix     1,000 mg 200 mL/hr over 60 Minutes Intravenous  Once 09/24/15 1232 09/24/15 1530   09/22/15 2200  clindamycin (CLEOCIN) IVPB 600 mg  Status:  Discontinued     600 mg 100 mL/hr over 30 Minutes Intravenous Every 8 hours 09/22/15 2013 09/24/15 1216   09/22/15 1545  vancomycin (VANCOCIN) IVPB 1000 mg/200 mL premix     1,000 mg 200 mL/hr over 60 Minutes Intravenous  Once 09/22/15 1540 09/22/15 1733   09/22/15  1545  cefTRIAXone (ROCEPHIN) 1 g in dextrose 5 % 50 mL IVPB     1 g 100 mL/hr over 30 Minutes Intravenous  Once 09/22/15 1542 09/22/15 1733      Assessment/Plan: s/p Procedure(s): IRRIGATION AND DEBRIDEMENT OF RIGHT HIP ABSCESS   Patient is doing well will warrant continued IV antibiotics for now dressing changes as they get wet. This discussed with nursing and the patient and her husband. They home tomorrow with Penrose drains in place  Lattie Hawichard E Providence Stivers, MD, [facility]  09/25/2015

## 2015-09-25 NOTE — Progress Notes (Signed)
Pharmacy Antibiotic Note  Nancy York is a 70 y.o. female admitted on 09/22/2015 with cellulitis.  Pharmacy has been consulted for vancomycin dosing.  This is day4 of antibiotics. Patient was receiving clindamycin but has significant purulent discharge. Antibiotics being changed to vancomycin.  Plan: Will continue vancomycin 1000mg  IV q24hr for goal trough of 10-15. Will obtain trough prior to dose on 6/4.  Height: 5\' 2"  (157.5 cm) Weight: 148 lb (67.132 kg) IBW/kg (Calculated) : 50.1  Temp (24hrs), Avg:97.3 F (36.3 C), Min:96.8 F (36 C), Max:97.9 F (36.6 C)   Recent Labs Lab 09/22/15 1445 09/22/15 1610 09/23/15 0647 09/24/15 0716  WBC 16.2*  --  16.8* 12.6*  CREATININE 1.02*  --  0.70 0.52  LATICACIDVEN  --  1.6  --   --     Estimated Creatinine Clearance: 57.9 mL/min (by C-G formula based on Cr of 0.52).    Allergies  Allergen Reactions  . Statins Other (See Comments)    Reaction:  Leg cramps   . Penicillins Rash and Other (See Comments)    Has patient had a PCN reaction causing immediate rash, facial/tongue/throat swelling, SOB or lightheadedness with hypotension: No Has patient had a PCN reaction causing severe rash involving mucus membranes or skin necrosis: No Has patient had a PCN reaction that required hospitalization No Has patient had a PCN reaction occurring within the last 10 years: Yes If all of the above answers are "NO", then may proceed with Cephalosporin use.  . Tape Rash   Antimicrobials this admission: clindamycin 5/31 >> 6/1 vancomycin 6/1 >>   Dose adjustments this admission:  Microbiology results: 5/30 BCx: no growth x 3 days 5/30 Wound: MRSA 5/30 MRSA PCR: positive  Pharmacy will continue to monitor and adjust per consult.   Freedom Lopezperez L 09/25/2015 10:15 PM

## 2015-09-25 NOTE — Patient Outreach (Signed)
This encounter was opened in error.   Costella HatcherJanci Arrie Borrelli RN, BSN [facility]  Hospital Liaison  [phone]) Business Mobile [phone]) Toll free office

## 2015-09-25 NOTE — Care Management Important Message (Signed)
Important Message  Patient Details  Name: Nancy York MRN: [id number] Date of Birth: May 06, 1945   Medicare Important Message Given:  Yes    Collie SiadAngela Feige Lowdermilk, RN 09/25/2015, 11:38 AM

## 2015-09-25 NOTE — Progress Notes (Signed)
[facility] -  at [facility]   PATIENT NAME: Clelia SchaumannSandra Grotz    MR#:  [id number]  DATE OF BIRTH:  1946/04/15  SUBJECTIVE:  CHIEF COMPLAINT:   Chief Complaint  Patient presents with  . Abscess   No pain. No shortness of breath or fever or vomiting or abdominal pain. Surgical site sore.   REVIEW OF SYSTEMS:    Review of Systems  Constitutional: Negative for fever and chills.  HENT: Negative for sore throat.   Eyes: Negative for blurred vision, double vision and pain.  Respiratory: Negative for cough, hemoptysis, shortness of breath and wheezing.   Cardiovascular: Negative for chest pain, palpitations, orthopnea and leg swelling.  Gastrointestinal: Negative for heartburn, nausea, vomiting, abdominal pain, diarrhea and constipation.  Genitourinary: Negative for dysuria and hematuria.  Musculoskeletal: Positive for back pain. Negative for joint pain.  Skin: Negative for rash.  Neurological: Positive for weakness. Negative for headaches.    DRUG ALLERGIES:   Allergies  Allergen Reactions  . Statins Other (See Comments)    Reaction:  Leg cramps   . Penicillins Rash and Other (See Comments)    Has patient had a PCN reaction causing immediate rash, facial/tongue/throat swelling, SOB or lightheadedness with hypotension: No Has patient had a PCN reaction causing severe rash involving mucus membranes or skin necrosis: No Has patient had a PCN reaction that required hospitalization No Has patient had a PCN reaction occurring within the last 10 years: Yes If all of the above answers are "NO", then may proceed with Cephalosporin use.  . Tape Rash    VITALS:  Blood pressure 140/54, pulse 90, temperature 97.5 F (36.4 C), temperature source Oral, resp. rate 18, height 5\' 2"  (1.575 m), weight 67.132 kg (148 lb), SpO2 100 %.  PHYSICAL EXAMINATION:   Physical Exam  GENERAL:  70 y.o.-year-old patient lying in the bed with no acute distress.  EYES:  Pupils equal, round, reactive to light and accommodation. No scleral icterus. Extraocular muscles intact.  HEENT: Head atraumatic, normocephalic. Oropharynx and nasopharynx clear.  NECK:  Supple, no jugular venous distention. No thyroid enlargement, no tenderness.  LUNGS: Normal breath sounds bilaterally, no wheezing, rales, rhonchi. No use of accessory muscles of respiration.  CARDIOVASCULAR: S1, S2 normal. No murmurs, rubs, or gallops.  ABDOMEN: Soft, nontender, nondistended. Bowel sounds present. No organomegaly or mass.  EXTREMITIES: No cyanosis, clubbing or edema b/l.    NEUROLOGIC: Cranial nerves II through XII are intact. Moves all 4 extremities.   PSYCHIATRIC: The patient is alert and oriented x 3.  SKIN: Sacral pressure ulcer. Right gluteal incision site with significant purulent discharge. No fluctuance noticed. 2 penrose drains in place   LABORATORY PANEL:   CBC  Recent Labs Lab 09/24/15 0716  WBC 12.6*  HGB 10.1*  HCT 31.1*  PLT 452*   ------------------------------------------------------------------------------------------------------------------ Chemistries   Recent Labs Lab 09/22/15 1445  09/24/15 0716  NA 127*  < > 130*  K 4.7  < > 3.8  CL 89*  < > 96*  CO2 27  < > 28  GLUCOSE 114*  < > 89  BUN 20  < > 9  CREATININE 1.02*  < > 0.52  CALCIUM 8.8*  < > 8.4*  AST 33  --   --   ALT 27  --   --   ALKPHOS 138*  --   --   BILITOT 0.5  --   --   < > = values in this interval not displayed. ------------------------------------------------------------------------------------------------------------------  Cardiac Enzymes No results for input(s): TROPONINI in the last 168 hours. ------------------------------------------------------------------------------------------------------------------  RADIOLOGY:  No results found.   ASSESSMENT AND PLAN:   70 year old female status post left hip arthroplasty after mechanical fall who is at rehabilitation presents  today with buttock abscess status post I&D in the emergency room.  1. Right Buttock abscess: s/p Drainage in ED and then OR. MRSA in wound cx CT scan did not show evidence of abscess. On vancomycin. Will change to Bactrim at discharge Appreciate surgical help.  2. Recent arthroplasty after mechanical fall  3. Essential hypertension: Continue metoprolol.  4. Hyponatremia: On IV fluids and improving  All the records are reviewed and case discussed with Care Management/Social Workerr. Management plans discussed with the patient, family and they are in agreement.  CODE STATUS: FULL CODE  DVT Prophylaxis: SCDs  TOTAL TIME TAKING CARE OF THIS PATIENT: 25 minutes.   POSSIBLE D/C IN 2-3 DAYS, DEPENDING ON CLINICAL CONDITION.  Milagros Loll R M.D on 09/25/2015 at 11:02 AM  Between 7am to 6pm - Pager - [phone]  After 6pm go to [email or web address] - password [email or web address] [facility]  [place] [facility]  Office  [phone]  CC: Primary care physician; Marguarite Arbour, MD  Note: This dictation was prepared with Dragon dictation along with smaller phrase technology. Any transcriptional errors that result from this process are unintentional.

## 2015-09-26 LAB — WOUND CULTURE: Special Requests: NORMAL

## 2015-09-26 MED ORDER — LORAZEPAM 0.5 MG PO TABS
0.5000 mg | ORAL_TABLET | Freq: Once | ORAL | Status: AC
  Administered 2015-09-26: 0.5 mg via ORAL
  Filled 2015-09-26: qty 1

## 2015-09-26 MED ORDER — SULFAMETHOXAZOLE-TRIMETHOPRIM 800-160 MG PO TABS: 1.0000 | ORAL_TABLET | Freq: Two times a day (BID) | ORAL | Status: DC

## 2015-09-26 MED ORDER — LORAZEPAM 0.5 MG PO TABS: 0.5000 mg | ORAL_TABLET | Freq: Every day | ORAL | Status: DC | PRN

## 2015-09-26 MED ORDER — NYSTATIN 100000 UNIT/ML MT SUSP
5.0000 mL | Freq: Four times a day (QID) | OROMUCOSAL | Status: DC
  Administered 2015-09-26: 500000 [IU] via ORAL
  Filled 2015-09-26: qty 5

## 2015-09-26 MED ORDER — NYSTATIN 100000 UNIT/ML MT SUSP: 5.0000 mL | Freq: Four times a day (QID) | OROMUCOSAL | Status: DC

## 2015-09-26 NOTE — Progress Notes (Signed)
Pt for discharge . Highly anxious. Non consolable. Dr sudini notified. md to add ativan to plan of care

## 2015-09-26 NOTE — Progress Notes (Signed)
Patient has no complaints at this time. Afebrile Wound is clean with less radial and drainage around the Penrose drains and resolving erythema. Patient doing very well with MRSA she will likely be discharged today as I spoke to prime doc. She will follow-up in my office midweek for Penrose drain removal

## 2015-09-26 NOTE — Care Management Note (Signed)
Case Management Note  Patient Details  Name: Charlett BlakeSandra S Connon MRN: [id number] Date of Birth: April 20, 1946  Subjective/Objective:      [facility]               Action/Plan:   Expected Discharge Date:  09/23/15               Expected Discharge Plan:     In-House Referral:     Discharge planning Services     Post Acute Care Choice:    Choice offered to:     DME Arranged:    DME Agency:     HH Arranged:    HH Agency:     Status of Service:     Medicare Important Message Given:  Yes Date Medicare IM Given:    Medicare IM give by:    Date Additional Medicare IM Given:    Additional Medicare Important Message give by:     If discussed at Long Length of Stay Meetings, dates discussed:    Additional Comments:  Palestine Mosco A, RN 09/26/2015, 12:41 PM

## 2015-09-26 NOTE — Progress Notes (Addendum)
Pt discharged home on  New meds. I/s pt and cger on new meds including use of new  meds nystatin,,septra and ativan.i/s on wound care tid .  [facility]s [facility]. Understanding voiced. Left via w/c

## 2015-09-26 NOTE — Discharge Instructions (Signed)
°  DIET:  Cardiac diet  DISCHARGE CONDITION:  Stable  ACTIVITY:  Activity as before  OXYGEN:  Home Oxygen: No.   Oxygen Delivery: room air  DISCHARGE LOCATION:  home   If you experience worsening of your admission symptoms, develop shortness of breath, life threatening emergency, suicidal or homicidal thoughts you must seek medical attention immediately by calling 911 or calling your MD immediately  if symptoms less severe.  You Must read complete instructions/literature along with all the possible adverse reactions/side effects for all the Medicines you take and that have been prescribed to you. Take any new Medicines after you have completely understood and accpet all the possible adverse reactions/side effects.   Please note  You were cared for by a hospitalist during your hospital stay. If you have any questions about your discharge medications or the care you received while you were in the hospital after you are discharged, you can call the unit and asked to speak with the hospitalist on call if the hospitalist that took care of you is not available. Once you are discharged, your primary care physician will handle any further medical issues. Please note that NO REFILLS for any discharge medications will be authorized once you are discharged, as it is imperative that you return to your primary care physician (or establish a relationship with a primary care physician if you do not have one) for your aftercare needs so that they can reassess your need for medications and monitor your lab values.   Dry Dressing changes daily

## 2015-09-27 LAB — WOUND CULTURE

## 2015-09-27 LAB — CULTURE, BLOOD (ROUTINE X 2)
CULTURE: NO GROWTH
Culture: NO GROWTH

## 2015-09-27 NOTE — Discharge Summary (Signed)
[facility] - [place] at [facility]   PATIENT NAME: Nancy York    MR#:  [id number]  DATE OF BIRTH:  [birth year]  DATE OF ADMISSION:  09/22/2015 ADMITTING PHYSICIAN: Nancy Saran, MD  DATE OF DISCHARGE: 09/26/2015  4:45 PM  PRIMARY CARE PHYSICIAN: Nancy D, MD   ADMISSION DIAGNOSIS:  Abscess and cellulitis [L03.90, L02.91]  DISCHARGE DIAGNOSIS:  Active Problems:   Abscess   Abscess and cellulitis   Pressure ulcer   SECONDARY DIAGNOSIS:   Past Medical History  Diagnosis Date  . Hypertension   . GERD (gastroesophageal reflux disease)   . Diverticulosis   . Heart murmur     no doctor follows her for this  . Arthritis     joints, shoulder and knees     ADMITTING HISTORY  Nancy York is a 70 y.o. female with a known history of Recent left hip fracture status post arthroplasty and essential hypertension who presents with a buttock abscess. Patient reports that she has not been very mobile at [facility] and developed a buttock sore. This then apparently turned into an abscess. In the emergency room this is drained. CT scan after incision and drainage by ER PA reveals no abscess. Patient denies fever just pain that brought her to the emergency room. She was given vancomycin and Rocephin in the emergency room.  Daughter bedside is also concern the patient has had a little bit more confusion over the past few days.  HOSPITAL COURSE:   70 year old female status post left hip arthroplasty after mechanical fall who is at rehabilitation presents today with buttock abscess status post I&York in the emergency room.  1. Right Buttock abscess: s/p Drainage in ED and then OR. MRSA in wound cx CT scan did not show evidence of abscess. But on taking the patient to operating room she did have a deep abscess which was drained with 2 Penrose drains placed. On vancomycin In the hospital. changed to Bactrim at discharge Appreciated surgical  help. Discussed with Nancy York on day of discharge.  2. Recent arthroplasty after mechanical fall  3. Essential hypertension: Continue metoprolol.  4. Hyponatremia: On IV fluids and improved  5. Oral thrush Nystatin prescribed  6. Anxiety Ativan when necessary  Stable for discharge home with home health physical therapy and nursing for wound care.  CONSULTS OBTAINED:  Treatment Team:  Nancy Haw, MD  DRUG ALLERGIES:   Allergies  Allergen Reactions  . Statins Other (See Comments)    Reaction:  Leg cramps   . Penicillins Rash and Other (See Comments)    Has patient had a PCN reaction causing immediate rash, facial/tongue/throat swelling, SOB or lightheadedness with hypotension: No Has patient had a PCN reaction causing severe rash involving mucus membranes or skin necrosis: No Has patient had a PCN reaction that required hospitalization No Has patient had a PCN reaction occurring within the last 10 years: Yes If all of the above answers are "NO", then may proceed with Cephalosporin use.  . Tape Rash    DISCHARGE MEDICATIONS:   Discharge Medication List as of 09/26/2015  1:02 PM    START taking these medications   Details  LORazepam (ATIVAN) 0.5 MG tablet Take 1 tablet (0.5 mg total) by mouth daily as needed for anxiety., Starting 09/26/2015, Until Discontinued, Print    nystatin (MYCOSTATIN) 100000 UNIT/ML suspension Take 5 mLs (500,000 Units total) by mouth 4 (four) times daily., Starting 09/26/2015, Until Discontinued, Normal    sulfamethoxazole-trimethoprim (  BACTRIM DS,SEPTRA DS) 800-160 MG tablet Take 1 tablet by mouth 2 (two) times daily., Starting 09/26/2015, Until Discontinued, Normal      CONTINUE these medications which have NOT CHANGED   Details  acetaminophen (TYLENOL) 325 MG tablet Take 1 tablet (325 mg total) by mouth every 6 (six) hours as needed for mild pain (or Fever >/= 101)., Starting 07/21/2015, Until Discontinued, OTC    amLODipine (NORVASC) 10 MG  tablet Take 10 mg by mouth daily., Until Discontinued, Historical Med    aspirin EC 81 MG tablet Take 81 mg by mouth daily., Until Discontinued, Historical Med    calcium citrate-vitamin York (CITRACAL+York) 315-200 MG-UNIT per tablet Take 1 tablet by mouth 3 (three) times daily. , Until Discontinued, Historical Med    cetirizine (ZYRTEC) 10 MG tablet Take 10 mg by mouth daily., Until Discontinued, Historical Med    dicyclomine (BENTYL) 20 MG tablet Take 40 mg by mouth 4 (four) times daily., Until Discontinued, Historical Med    diphenhydrAMINE (BENADRYL) 25 mg capsule Take 1 capsule (25 mg total) by mouth every 8 (eight) hours as needed for itching or allergies., Starting 07/21/2015, Until Discontinued, Normal    Flaxseed, Linseed, (FLAX SEED OIL) 1000 MG CAPS Take 1,000 mg by mouth 3 (three) times daily. , Until Discontinued, Historical Med    Ginkgo Biloba 120 MG CAPS Take 120 mg by mouth daily. , Until Discontinued, Historical Med    Inulin (FIBER CHOICE FRUITY BITES PO) Take 1 each by mouth at bedtime. , Until Discontinued, Historical Med    meclizine (ANTIVERT) 25 MG tablet Take 25 mg by mouth 3 (three) times daily as needed for dizziness., Until Discontinued, Historical Med    nystatin cream (MYCOSTATIN) Apply 1 application topically 2 (two) times daily as needed (for rash)., Until Discontinued, Historical Med    ondansetron (ZOFRAN-ODT) 8 MG disintegrating tablet Take 8 mg by mouth every 8 (eight) hours as needed for nausea or vomiting., Until Discontinued, Historical Med    Plant Sterols and Stanols (CHOLESTOFF PLUS) 450 MG CAPS Take 900 mg by mouth 2 (two) times daily., Until Discontinued, Historical Med    potassium chloride (KLOR-CON) 20 MEQ packet Take 20 mEq by mouth daily., Until Discontinued, Historical Med    RABEprazole (ACIPHEX) 20 MG tablet Take 20 mg by mouth 2 (two) times daily., Until Discontinued, Historical Med    ranitidine (ZANTAC) 150 MG tablet Take 150 mg by mouth  2 (two) times daily., Until Discontinued, Historical Med    scopolamine (TRANSDERM-SCOP) 1 MG/3DAYS Place 1 patch onto the skin every 3 (three) days., Until Discontinued, Historical Med    sodium chloride (OCEAN) 0.65 % SOLN nasal spray Place 1 spray into both nostrils as needed for congestion., Until Discontinued, Historical Med    vitamin B-12 (CYANOCOBALAMIN) 500 MCG tablet Take 500 mcg by mouth 2 (two) times daily. , Until Discontinued, Historical Med    Vitamin York, Ergocalciferol, (DRISDOL) 50000 units CAPS capsule Take 50,000 Units by mouth every 7 (seven) days. Pt takes on Sunday., Until Discontinued, Historical Med      STOP taking these medications     loratadine (CLARITIN) 10 MG tablet      metoprolol succinate (TOPROL XL) 50 MG 24 hr tablet      oxyCODONE (OXY IR/ROXICODONE) 5 MG immediate release tablet      pantoprazole (PROTONIX) 40 MG tablet         Today   VITAL SIGNS:  Blood pressure 135/62, pulse 77, temperature 97.8 F (  36.6 C), temperature source Oral, resp. rate 18, height 5\' 2"  (1.575 m), weight 67.132 kg (148 lb), SpO2 95 %.  I/O:  No intake or output data in the 24 hours ending 09/27/15 1351  PHYSICAL EXAMINATION:  Physical Exam  GENERAL:  70 y.o.-year-old patient lying in the bed with no acute distress.  LUNGS: Normal breath sounds bilaterally, no wheezing, rales,rhonchi or crepitation. No use of accessory muscles of respiration.  CARDIOVASCULAR: S1, S2 normal. No murmurs, rubs, or gallops.  ABDOMEN: Soft, non-tender, non-distended. Bowel sounds present. No organomegaly or mass.  NEUROLOGIC: Moves all 4 extremities. PSYCHIATRIC: The patient is alert and oriented x 3.  SKIN:  Sacral pressure ulcer. Right gluteal incision site with 2 Penrose drains. Some purulent discharge. No fluctuance noticed.  DATA REVIEW:   CBC  Recent Labs Lab 09/24/15 0716  WBC 12.6*  HGB 10.1*  HCT 31.1*  PLT 452*    Chemistries   Recent Labs Lab  09/22/15 1445  09/24/15 0716  NA 127*  < > 130*  K 4.7  < > 3.8  CL 89*  < > 96*  CO2 27  < > 28  GLUCOSE 114*  < > 89  BUN 20  < > 9  CREATININE 1.02*  < > 0.52  CALCIUM 8.8*  < > 8.4*  AST 33  --   --   ALT 27  --   --   ALKPHOS 138*  --   --   BILITOT 0.5  --   --   < > = values in this interval not displayed.  Cardiac Enzymes No results for input(s): TROPONINI in the last 168 hours.  Microbiology Results  Results for orders placed or performed during the hospital encounter of 09/22/15  Culture, blood (routine x 2)     Status: None   Collection Time: 09/22/15  2:44 PM  Result Value Ref Range Status   Specimen Description BLOOD RIGHT ASSIST CONTROL  Final   Special Requests BOTTLES DRAWN AEROBIC AND ANAEROBIC  15CC  Final   Culture NO GROWTH 5 DAYS  Final   Report Status 09/27/2015 FINAL  Final  Culture, blood (routine x 2)     Status: None   Collection Time: 09/22/15  2:44 PM  Result Value Ref Range Status   Specimen Description BLOOD  Final   Special Requests AER 5CC ANA 5CC  Final   Culture NO GROWTH 5 DAYS  Final   Report Status 09/27/2015 FINAL  Final  MRSA PCR Screening     Status: Abnormal   Collection Time: 09/22/15 10:43 PM  Result Value Ref Range Status   MRSA by PCR POSITIVE (A) NEGATIVE Final    Comment:        The GeneXpert MRSA Assay (FDA approved for NASAL specimens only), is one component of a comprehensive MRSA colonization surveillance program. It is not intended to diagnose MRSA infection nor to guide or monitor treatment for MRSA infections. CRITICAL RESULT CALLED TO, READ BACK BY AND VERIFIED WITH: MARY RAYMOND @ 0012 ON 09/23/2015 BY CAF   WOUND CULTURE ([facility] ONLY)     Status: None   Collection Time: 09/22/15 10:45 PM  Result Value Ref Range Status   Specimen Description ABSCESS  Final   Special Requests Normal  Final   Gram Stain RARE WBC SEEN RARE GRAM POSITIVE COCCI   Final   Culture   Final    MODERATE GROWTH METHICILLIN  RESISTANT STAPHYLOCOCCUS AUREUS   Report Status 09/26/2015 FINAL  Final   Organism ID, Bacteria METHICILLIN RESISTANT STAPHYLOCOCCUS AUREUS  Final      Susceptibility   Methicillin resistant staphylococcus aureus - MIC*    CIPROFLOXACIN >=8 RESISTANT Resistant     ERYTHROMYCIN >=8 RESISTANT Resistant     GENTAMICIN <=0.5 SENSITIVE Sensitive     OXACILLIN >=4 RESISTANT Resistant     TETRACYCLINE 2 SENSITIVE Sensitive     VANCOMYCIN 1 SENSITIVE Sensitive     TRIMETH/SULFA <=10 SENSITIVE Sensitive     CLINDAMYCIN <=0.25 SENSITIVE Sensitive     RIFAMPIN <=0.5 SENSITIVE Sensitive     Inducible Clindamycin NEGATIVE Sensitive     * MODERATE GROWTH METHICILLIN RESISTANT STAPHYLOCOCCUS AUREUS  Anaerobic culture     Status: None (Preliminary result)   Collection Time: 09/24/15  5:25 PM  Result Value Ref Range Status   Specimen Description WOUND  Final   Special Requests NONE  Final   Culture NO ANAEROBES ISOLATED  Final   Report Status PENDING  Incomplete  WOUND CULTURE ([facility] ONLY)     Status: None   Collection Time: 09/24/15  5:25 PM  Result Value Ref Range Status   Specimen Description WOUND  Final   Special Requests NONE  Final   Gram Stain FEW WBC SEEN FEW GRAM POSITIVE COCCI   Final   Culture MODERATE GROWTH STAPHYLOCOCCUS AUREUS  Final   Report Status 09/27/2015 FINAL  Final   Organism ID, Bacteria STAPHYLOCOCCUS AUREUS  Final      Susceptibility   Staphylococcus aureus - MIC*    CIPROFLOXACIN >=8 RESISTANT Resistant     ERYTHROMYCIN >=8 RESISTANT Resistant     GENTAMICIN <=0.5 SENSITIVE Sensitive     OXACILLIN 2 SENSITIVE Sensitive     TETRACYCLINE <=1 SENSITIVE Sensitive     VANCOMYCIN 1 SENSITIVE Sensitive     TRIMETH/SULFA <=10 SENSITIVE Sensitive     CLINDAMYCIN <=0.25 SENSITIVE Sensitive     RIFAMPIN <=0.5 SENSITIVE Sensitive     Inducible Clindamycin NEGATIVE Sensitive     * MODERATE GROWTH STAPHYLOCOCCUS AUREUS    RADIOLOGY:  No results found.  Follow up with  PCP in 1 week.  Management plans discussed with the patient, family and they are in agreement.  CODE STATUS:  Code Status History    Date Active Date Inactive Code Status Order ID Comments User Context   09/22/2015  8:13 PM 09/26/2015  7:45 PM Full Code [id number]  Nancy Saran, MD Inpatient   07/18/2015 10:41 PM 07/21/2015  7:44 PM Full Code [id number]  Oralia Manis, MD Inpatient   08/25/2014  7:10 PM 08/28/2014  3:08 PM Full Code [id number]  Donato Heinz, MD Inpatient   08/25/2014  7:10 PM 08/25/2014  7:10 PM Full Code [id number]  Donato Heinz, MD Inpatient      TOTAL TIME TAKING CARE OF THIS PATIENT ON DAY OF DISCHARGE: more than 30 minutes.   Milagros Loll R M.York on 09/27/2015 at 1:51 PM  Between 7am to 6pm - Pager - [phone]  After 6pm go to [email or web address] - password [email or web address] [facility]  [place] [facility]  Office  [phone]  CC: Primary care physician; Marguarite Arbour, MD  Note: This dictation was prepared with Dragon dictation along with smaller phrase technology. Any transcriptional errors that result from this process are unintentional.

## 2015-09-29 ENCOUNTER — Other Ambulatory Visit: Payer: Self-pay | Admitting: *Deleted

## 2015-09-29 ENCOUNTER — Encounter: Payer: Self-pay | Admitting: *Deleted

## 2015-09-29 NOTE — Patient Outreach (Signed)
Transition of care call (week 1, discharged 6/3).  Spoke with pt, HIPPA verified, discussed with pt  referral received to f/u on recent hospitalization.   Pt reports since discharge, appetite better, no longer nauseous.   Pt reports HH RN coming daily for wound care (buttock access).  Pt reports to f/u with Dr. Excell Seltzerooper 6/12 and Dr. Judithann SheenSparks next week.   RN CM discussed [facility] transition of care program, f/u with weekly phone calls 31 days post day of discharge as well as providing a home visit (no cost) to which pt agreed.  As discussed, plan to f/u again 6/14- home visit.    Patient was recently discharged from hospital and all medications have been reviewed- 6/6.    Plan to inform Dr. Judith[facility]Sparks [facility] involvement- fax barrier letter.    Shayne Alkenose M.   Pierzchala RN CCM [facility] Care Management  [phone]

## 2015-10-01 ENCOUNTER — Other Ambulatory Visit: Payer: Self-pay

## 2015-10-01 DIAGNOSIS — E785 Hyperlipidemia, unspecified: Secondary | ICD-10-CM | POA: Insufficient documentation

## 2015-10-01 DIAGNOSIS — E079 Disorder of thyroid, unspecified: Secondary | ICD-10-CM | POA: Insufficient documentation

## 2015-10-01 LAB — ANAEROBIC CULTURE

## 2015-10-02 ENCOUNTER — Encounter: Payer: Self-pay | Admitting: *Deleted

## 2015-10-02 ENCOUNTER — Other Ambulatory Visit: Payer: Self-pay | Admitting: *Deleted

## 2015-10-02 NOTE — Patient Outreach (Signed)
[facility]) Care Management  10/02/2015  Nancy York 04-27-1945 [id number]   Phone call to patient to assess for continued social work involvement.  Per patient , her wound is healing.  Patient continues to receive wound care from home health nurse.  She is able to afford her medications and she reports having transportation to all of her medical appointments by way of her spouse.  Per patient, her appetite has improved a little bit, drinks about 2 to 3 Boost per day.  However tried to eat oatmeal this morning and it came back up. Patient verbalized haivng no social work needs at this time.  [facility] RNCM has scheduled a home visit for 10/07/15.   [facility] be [facility] at this time. RNCM and provider to be notified.    Adriana ReamsChrystal Land, LCSW [facility] Care Management [phone]

## 2015-10-05 ENCOUNTER — Encounter: Payer: Self-pay | Admitting: General Surgery

## 2015-10-05 ENCOUNTER — Other Ambulatory Visit: Payer: Self-pay

## 2015-10-05 ENCOUNTER — Ambulatory Visit (INDEPENDENT_AMBULATORY_CARE_PROVIDER_SITE_OTHER): Payer: Commercial Managed Care - HMO | Admitting: General Surgery

## 2015-10-05 VITALS — BP 112/63 | HR 108 | Temp 97.5°F | Ht 62.0 in | Wt 145.0 lb

## 2015-10-05 DIAGNOSIS — Z4889 Encounter for other specified surgical aftercare: Secondary | ICD-10-CM

## 2015-10-05 NOTE — Patient Instructions (Signed)
We would like to see you back next week with Dr. Excell Seltzerooper. Please see your appointment below.  Your drains have been pulled today. Expect the drainage to increase for the next several days. You will continue to change the bandage at least once daily but also change if this is soiled.  Take your antibiotics until you have completed them and you do not need a refill at this time. However, if you develop increased redness, foul smelling thick drainage, fever, or nausea/vomiting; call our office immediately.

## 2015-10-05 NOTE — Progress Notes (Signed)
Outpatient Surgical Follow Up  10/05/2015  Nancy York is an 70 y.o. female.   Chief Complaint  Patient presents with  . Abscess    Right Sub-Gluteal Hip    HPI: 70 year old female returns to clinic for follow-up from an incision and drainage of a right hip abscess. Patient reports feeling much better than prior to drainage. She has 2 drains that are still in place that are sutured in place. She denies any current fevers, chills, nausea, vomiting, diarrhea, constipation. She has been very happy with her surgical experience this far.  Past Medical History  Diagnosis Date  . Hypertension   . GERD (gastroesophageal reflux disease)   . Diverticulosis   . Heart murmur     no doctor follows her for this  . Arthritis     joints, shoulder and knees    Past Surgical History  Procedure Laterality Date  . Tonsillectomy    . Dilation and curettage of uterus    . Rotator cuff repair Right 2006    Dr. Gerrit Heck   . Knee arthroscopy Left   . Bilateral carpal tunnel release Bilateral   . 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 Reita Chard  . Fracture surgery Left 2007    fractured left  hip  . Joint replacement Right 08/2014    total knee replacement  . Total hip arthroplasty Left 06/24/2015    Procedure: TOTAL HIP ARTHROPLASTY;  Surgeon: Donato Heinz, MD;  Location: [facility] ORS;  Service: Orthopedics;  Laterality: Left;  . Hardware removal Left 06/24/2015    Procedure: HARDWARE REMOVAL/ DHS;  Surgeon: Donato Heinz, MD;  Location: [facility] ORS;  Service: Orthopedics;  Laterality: Left;  . Irrigation and debridement abscess Right 09/24/2015    Procedure: IRRIGATION AND DEBRIDEMENT OF RIGHT HIP ABSCESS;  Surgeon: Lattie Haw, MD;  Location: [facility] ORS;  Service: General;  Laterality: Right;    Family History  Problem Relation Age of Onset  . Breast cancer Neg Hx   . Diabetes Father     Social History:  reports that she has never smoked. She has never used smokeless tobacco. She  reports that she does not drink alcohol or use illicit drugs.  Allergies:  Allergies  Allergen Reactions  . Fish-Derived Products Other (See Comments)    "leg cramps"  . Statins Other (See Comments)    Reaction:  Leg cramps   . Other Rash  . Penicillins Rash and Other (See Comments)    Has patient had a PCN reaction causing immediate rash, facial/tongue/throat swelling, SOB or lightheadedness with hypotension: No Has patient had a PCN reaction causing severe rash involving mucus membranes or skin necrosis: No Has patient had a PCN reaction that required hospitalization No Has patient had a PCN reaction occurring within the last 10 years: Yes If all of the above answers are "NO", then may proceed with Cephalosporin use.  . Tape Rash    Medications reviewed.    ROS A multipoint review of systems was completed. All pertinent positives and negatives are documented in the history of present illness remainder negative.   BP 112/63 mmHg  Pulse 108  Temp(Src) 97.5 F (36.4 C) (Oral)  Ht  (1.575 m)  Wt 65.772 kg (145 lb)  BMI 26.51 kg/m2  Physical Exam  Gen.: No acute distress Chest: Clear to sedation Heart: Tachycardic Abdomen: Soft and nontender Skin: Right hip I&D site with 2 Penrose drain sewn in place with nylon suture.  No results found for this or any previous visit (from the past 48 hour(s)). No results found.  Assessment/Plan:  1. Aftercare following surgery 70 year old female status post I&D of right hip abscess. Penrose drains removed in clinic today without any difficulty. Given the size and depth of the wound discussed with patient to anticipate increased drainage over the next 24-48 hours. She is to complete the course of antibiotics she is currently on. She'll follow-up in clinic in 1 week for additional wound check.     Ricarda Frameharles Izea Livolsi, MD FACS General Surgeon  10/05/2015,4:35 PM

## 2015-10-07 ENCOUNTER — Other Ambulatory Visit: Payer: Self-pay | Admitting: *Deleted

## 2015-10-07 ENCOUNTER — Encounter: Payer: Self-pay | Admitting: *Deleted

## 2015-10-07 NOTE — Patient Outreach (Addendum)
[facility]) Care Management   10/07/2015  Nancy York 1945-07-14 [id number]  Nancy York is an 70 y.o. female  Subjective:  Pt reports appetite is no better, having 3 Boost a day, small amounts of food.  Pt  Reports problem is can't chew, face is numb to which spouse and daughter think is coming from Antibiotic (took last one last night).  Pt reports on recent right  hip surgery, three weeks following surgery Fell and broke left femur bone (brace intact) plus cellulitis in right hip.  Pt reports she walks a little, 25% weight bearing on left leg.  Pt  reports f/u with surgeon 6/12, removed tubes from right buttock (abcess site[facility], spouse does remaining days.  Pt reports to f/u with Dr. Judithann SheenSparks tomorrow, infection  MD 6/19.    Objective:   Filed Vitals:   10/07/15 1522  BP: 110/52  Pulse: 100  Resp: 16    ROS  Physical Exam  Constitutional: She is oriented to person, place, and time. She appears well-developed.  Cardiovascular: Normal rate, regular rhythm and normal heart sounds.   Respiratory: Effort normal and breath sounds normal.  GI: Soft. Bowel sounds are normal.  Musculoskeletal:  Walks short distances, 25 % weight bearing on left leg.   Neurological: She is alert and oriented to person, place, and time.  Skin: Skin is warm.  Buttock wound- small amount of drainage noted on dressing.    Psychiatric: She has a normal mood and affect. Her behavior is normal. Judgment and thought content normal.    Encounter Medications:   Outpatient Encounter Prescriptions as of 10/07/2015  Medication Sig Note  . acetaminophen (TYLENOL) 325 MG tablet Take 1 tablet (325 mg total) by mouth every 6 (six) hours as needed for mild pain (or Fever >/= 101). 09/29/2015: As needed.   Marland Kitchen. amLODipine (NORVASC) 10 MG tablet Take 10 mg by mouth daily.   Marland Kitchen. aspirin EC 81 MG tablet Take 81 mg by mouth daily.   . calcium citrate-vitamin D  (CITRACAL+D) 315-200 MG-UNIT per tablet Take 1 tablet by mouth 3 (three) times daily.    . diazepam (VALIUM) 5 MG tablet  10/07/2015: As needed.   . dicyclomine (BENTYL) 20 MG tablet Take 40 mg by mouth 4 (four) times daily. 10/07/2015: As needed.   Marland Kitchen. LORazepam (ATIVAN) 0.5 MG tablet Take 1 tablet (0.5 mg total) by mouth daily as needed for anxiety. 10/07/2015: As needed.   . meclizine (ANTIVERT) 25 MG tablet Take 25 mg by mouth 3 (three) times daily as needed for dizziness. 09/29/2015: As needed.   . nystatin (MYCOSTATIN) 100000 UNIT/ML suspension Take 5 mLs (500,000 Units total) by mouth 4 (four) times daily.   Marland Kitchen. nystatin cream (MYCOSTATIN) Apply 1 application topically 2 (two) times daily as needed (for rash). 10/07/2015: As needed.   . ondansetron (ZOFRAN-ODT) 8 MG disintegrating tablet Take 8 mg by mouth every 8 (eight) hours as needed for nausea or vomiting. Reported on 09/29/2015 10/07/2015: As needed.   . polyethylene glycol powder (GLYCOLAX/MIRALAX) powder  10/05/2015: Received from: External Pharmacy  . potassium chloride (KLOR-CON) 20 MEQ packet Take 20 mEq by mouth daily.   . RABEprazole (ACIPHEX) 20 MG tablet Take 20 mg by mouth 2 (two) times daily.   . ranitidine (ZANTAC) 150 MG tablet Take 150 mg by mouth 2 (two) times daily. 10/07/2015: As needed.   . sodium chloride (OCEAN) 0.65 % SOLN  nasal spray Place 1 spray into both nostrils as needed for congestion. Reported on 09/29/2015 10/07/2015: As needed.   . vitamin B-12 (CYANOCOBALAMIN) 500 MCG tablet Take 500 mcg by mouth 2 (two) times daily.    . Vitamin D, Ergocalciferol, (DRISDOL) 50000 units CAPS capsule Take 50,000 Units by mouth every 7 (seven) days. Pt takes on Sunday.   . cetirizine (ZYRTEC) 10 MG tablet Take 10 mg by mouth daily. Reported on 10/07/2015   . diphenhydrAMINE (BENADRYL) 25 mg capsule Take 1 capsule (25 mg total) by mouth every 8 (eight) hours as needed for itching or allergies. (Patient not taking: Reported on 10/07/2015)   .  Flaxseed, Linseed, (FLAX SEED OIL) 1000 MG CAPS Take 1,000 mg by mouth 3 (three) times daily. Reported on 10/07/2015   . flunisolide (NASALIDE) 25 MCG/ACT (0.025%) SOLN  10/01/2015: Received from: External Pharmacy  . Ginkgo Biloba 120 MG CAPS Take 120 mg by mouth daily. Reported on 10/07/2015   . Inulin (FIBER CHOICE FRUITY BITES PO) Take 1 each by mouth at bedtime. Reported on 10/07/2015   . lactulose (CHRONULAC) 10 GM/15ML solution Reported on 10/07/2015 10/05/2015: Received from: External Pharmacy  . metoCLOPramide (REGLAN) 5 MG tablet Take by mouth. Reported on 10/07/2015 10/01/2015: Received from: [facility]  . metoprolol succinate (TOPROL-XL) 50 MG 24 hr tablet Reported on 10/07/2015 10/01/2015: Received from: External Pharmacy  . ofloxacin (FLOXIN) 0.3 % otic solution Reported on 10/07/2015 10/01/2015: Received from: External Pharmacy  . ondansetron (ZOFRAN-ODT) 4 MG disintegrating tablet Reported on 10/07/2015 10/07/2015: As needed.   . Plant Sterols and Stanols (CHOLESTOFF PLUS) 450 MG CAPS Take 900 mg by mouth 2 (two) times daily. Reported on 10/07/2015   . polyethylene glycol (MIRALAX / GLYCOLAX) packet Take by mouth. Reported on 10/07/2015 10/07/2015: As needed.   Marland Kitchen scopolamine (TRANSDERM-SCOP) 1 MG/3DAYS Place 1 patch onto the skin every 3 (three) days. Reported on 10/07/2015   . sulfamethoxazole-trimethoprim (BACTRIM DS,SEPTRA DS) 800-160 MG tablet Take 1 tablet by mouth 2 (two) times daily. (Patient not taking: Reported on 10/07/2015) 10/07/2015: Completed   . traMADol (ULTRAM) 50 MG tablet Reported on 10/07/2015 10/01/2015: Received from: External Pharmacy   No facility-administered encounter medications on file as of 10/07/2015.    Functional Status:   In your present state of health, do you have any difficulty performing the following activities: 10/07/2015 09/22/2015  Hearing? N N  Vision? N Y  Difficulty concentrating or making decisions? N N  Walking or climbing stairs? Y Y   Dressing or bathing? Y Y  Doing errands, shopping? Y N  Preparing Food and eating ? Y -  Using the Toilet? N -  In the past six months, have you accidently leaked urine? N -  Do you have problems with loss of bowel control? N -  Managing your Medications? Y -  Managing your Finances? Y -  Housekeeping or managing your Housekeeping? Y -    Fall/Depression Screening:    PHQ 2/9 Scores 10/07/2015 09/09/2015  PHQ - 2 Score 2 0  PHQ- 9 Score 9 -    Assessment:   Pleasant 70 year old woman, resides with spouse, very supportive.                           Lungs clear, no c/o pain.  Skin integrity-  Right buttock wound assessed, small amount of drainage noted                              On dressing (tube removed)- new dry sterile dressing applied.                           Nutrition-  Pt reports appetite not better, face numbness makes it difficult to eat.                                               Pt tolerates liquids better- suggestion made to make smoothies (add protein,                                                   Boost, fruits, vegetables).                             Plan:  Pt to f/u with Dr. Judithann Sheen 6/15.            Spouse to continue to change right buttock dressing on days when [facility] RN not coming.             RN CM to continue to follow for  transition of care, as discussed with pt- coworker Systems analyst CM                (covering for this RN CM) will call her next week.              RN CM to f/u 6/29 telephonically- part of ongoing transition of care.             Plan to fax Dr. Judithann Sheen  6/14 home visit encounter.                       [facility] CM Care Plan Problem Two        Most Recent Value   Care Plan Problem Two  Risk for readmission related to recent hospitalization - abcess    Role Documenting the Problem Two  Care Management Coordinator   Care Plan for Problem Two  Active   Interventions for Problem Two Long Term Goal   Home visit done-  medications reviewed.    [facility] Long Term Goal (31-90) days  Pt would not readmit 31 days from day of discharge    [facility] Long Term Goal Start Date  09/29/15   [facility] CM Short Term Goal #1 (0-30 days)  Improvement seen in buttock abcess in the next 14 days    [facility] CM Short Term Goal #1 Start Date  09/29/15   Interventions for Short Term Goal #2   Assessed buttock wound- signs of healing seen.     [facility] CM Care Plan Problem Three        Most Recent Value   Care Plan Problem Three  Impaired nutrition    Role Documenting the Problem Three  Care Management Coordinator   Care Plan for Problem Three  Active   [facility] CM Short Term Goal #1 (0-30 days)  Pt's nutritional status, appetitie would improve in the next 14 days    [facility] CM Short Term Goal #1 Start Date  10/07/15   Interventions for Short Term Goal #1  Discussed with pt importance of nutrition for healing, continue to take 3 boost a day- add yogurt, fruit, vegetables - Smoothie       Rose M.   Pierzchala RN CCM [facility] Care Management  [phone]

## 2015-10-08 ENCOUNTER — Other Ambulatory Visit: Payer: Self-pay

## 2015-10-12 ENCOUNTER — Encounter: Payer: Self-pay | Admitting: Surgery

## 2015-10-12 ENCOUNTER — Ambulatory Visit (INDEPENDENT_AMBULATORY_CARE_PROVIDER_SITE_OTHER): Payer: Commercial Managed Care - HMO | Admitting: Surgery

## 2015-10-12 VITALS — BP 134/75 | HR 109 | Temp 97.1°F | Ht 62.0 in | Wt 145.0 lb

## 2015-10-12 DIAGNOSIS — Z4889 Encounter for other specified surgical aftercare: Secondary | ICD-10-CM

## 2015-10-12 NOTE — Patient Instructions (Signed)
If you are still having drainage on 11/11/15, please call our office so that we can set you up an appointment to see Dr. Excell Seltzerooper.  If you have any questions or concerns, please call our office.

## 2015-10-12 NOTE — Progress Notes (Signed)
This patient status post I&D of a buttock abscess drains were removed last week by Dr. Tonita CongWoodham she has known MRSA and has completed her antibiotic therapy.   Patient describes no pain no fevers no chills and minimal drainage.  Wound is clean with no purulence no erythema nontender on the right buttock.  Patient doing very well recommend follow-up in 1 month if this is not completely closed and continue dry dressings as needed.

## 2015-10-15 ENCOUNTER — Other Ambulatory Visit: Payer: Self-pay | Admitting: *Deleted

## 2015-10-15 NOTE — Patient Outreach (Signed)
[facility] CM Telephone Outreach, Transition of Care, day 17  10/15/2015  Charlett BlakeSandra S Yurchak Jul 10, 1945 [id number]  Successful telephone outreach to Nancy SchaumannSandra Cropp, 70 y/o female, [facility] for [facility] of care after recent IP hospital visit May 30- September 26, 2015 for abscess and cellulitis.  Today, patient reports that she is "doing about the same," and confirmed that she attended her scheduled provider appointments last week, which "went just fine."  Patient reports that "the blood work looked good, and the surgeon said that my wound looked good too-- he released me and said I didn't have to go back to see him again."    [name] [facility] dressing for her abscess wound, which she reports "is just about completely healed up."    [name] states that her appetite "is not much better," reporting that she continues to drink her Boost as advised, but that her mouth has been numb, possibly from a "swollen facial nerve" that causes her to be unable to chew properly.  Patient states, "some days are better than others."  [name] denies further problems, concerns, issues, or questions.  I let her know that I would notify Jodi Mourningose Pierzchala, [facility] Community RN CM of our successful phone conversation today, and that Okey DupreRose would be following up with her next week for ongoing transition of care.  Plan:  Will notify Jodi Mourningose Pierzchala, [facility] RN CM of successful telephone conversation with patient today.   Caryl PinaLaine Mckinney Mose Colaizzi, RN, BSN, [employer] Community Care Coordinator [facility] Care Management  [phone]

## 2015-10-22 ENCOUNTER — Other Ambulatory Visit: Payer: Self-pay | Admitting: *Deleted

## 2015-10-22 NOTE — Patient Outreach (Signed)
Transition of care call successful (recent inpatient admission 5/30-6/3 Abscess and cellulitis).  Spoke with pt, HIPAA verified.   Pt reports wound (abscess) is almost healed, HH RN coming once a week now.  Pt reports left lower extremity doing good, HH PT working with her, walking more.  Pt reports appetite is the same, not better.  Pt reports continues to have 2-3 Boost a day, tried doing the smoothie and it came up.  Pt reports mouth remains numb, doctors aware.  Pt reports to f/u with Dr. Judithann SheenSparks next week.  RN CM discussed with pt f/u again 7/3- final transition of care call.     Shayne Alkenose M.   Pierzchala RN CCM [facility] Care [facility]  [phone]

## 2015-10-26 ENCOUNTER — Encounter: Payer: Self-pay | Admitting: *Deleted

## 2015-10-26 ENCOUNTER — Other Ambulatory Visit: Payer: Self-pay | Admitting: *Deleted

## 2015-10-26 NOTE — Patient Outreach (Signed)
Final transition of care call successful (in patient admission 5/30-6/3 abscess and cellulitis).  Spoke with pt, HIPAA verified.  Pt reports wound (buttock) is good, HH RN still coming once a week, to come tomorrow.   Pt reports appetite is not good, lost 2 lbs. (vomiting). Pt reports HH RN is suppose to be Following  up on that.   Pt reports continue to have Boost, taking motion sickness medication which helps some.   Pt reports to f/u with Dr. Judithann SheenSparks next week. Discussed with pt today is final transition of care, no further case management needs, plan to close case.   Plan to inform Dr. Judithann SheenSparks of case closure- fax case closure letter As discussed with pt, case closure letter to be sent to pt, includes [facility]'s main contact number to call if needs arise in the future.   Plan to inform [facility] care management assistant to close case.   Shayne Alkenose M.   Florencio Hollibaugh RN CCM [facility] Care [facility]  [phone]

## 2015-11-02 ENCOUNTER — Encounter: Payer: Self-pay | Admitting: Emergency Medicine

## 2015-11-02 ENCOUNTER — Emergency Department
Admission: EM | Admit: 2015-11-02 | Discharge: 2015-11-02 | Disposition: A | Discharge status: Home / Self Care | Payer: Commercial Managed Care - HMO | Attending: Emergency Medicine | Admitting: Emergency Medicine

## 2015-11-02 DIAGNOSIS — E785 Hyperlipidemia, unspecified: Secondary | ICD-10-CM | POA: Insufficient documentation

## 2015-11-02 DIAGNOSIS — R55 Syncope and collapse: Secondary | ICD-10-CM | POA: Diagnosis present

## 2015-11-02 DIAGNOSIS — Z7951 Long term (current) use of inhaled steroids: Secondary | ICD-10-CM | POA: Insufficient documentation

## 2015-11-02 DIAGNOSIS — Z7982 Long term (current) use of aspirin: Secondary | ICD-10-CM | POA: Insufficient documentation

## 2015-11-02 DIAGNOSIS — M1990 Unspecified osteoarthritis, unspecified site: Secondary | ICD-10-CM | POA: Diagnosis not present

## 2015-11-02 DIAGNOSIS — Z79899 Other long term (current) drug therapy: Secondary | ICD-10-CM | POA: Diagnosis not present

## 2015-11-02 DIAGNOSIS — H6092 Unspecified otitis externa, left ear: Secondary | ICD-10-CM

## 2015-11-02 DIAGNOSIS — I10 Essential (primary) hypertension: Secondary | ICD-10-CM | POA: Insufficient documentation

## 2015-11-02 LAB — URINALYSIS COMPLETE WITH MICROSCOPIC (ARMC ONLY)
BACTERIA UA: NONE SEEN
Bilirubin Urine: NEGATIVE
GLUCOSE, UA: NEGATIVE mg/dL
Hgb urine dipstick: NEGATIVE
Ketones, ur: NEGATIVE mg/dL
LEUKOCYTES UA: NEGATIVE
NITRITE: NEGATIVE
PROTEIN: 30 mg/dL — AB
RBC / HPF: NONE SEEN RBC/hpf (ref 0–5)
Specific Gravity, Urine: 1.017 (ref 1.005–1.030)
pH: 8 (ref 5.0–8.0)

## 2015-11-02 LAB — BASIC METABOLIC PANEL
Anion gap: 8 (ref 5–15)
BUN: 11 mg/dL (ref 6–20)
CALCIUM: 10.9 mg/dL — AB (ref 8.9–10.3)
CO2: 30 mmol/L (ref 22–32)
CREATININE: 0.61 mg/dL (ref 0.44–1.00)
Chloride: 97 mmol/L — ABNORMAL LOW (ref 101–111)
Glucose, Bld: 117 mg/dL — ABNORMAL HIGH (ref 65–99)
Potassium: 3.8 mmol/L (ref 3.5–5.1)
SODIUM: 135 mmol/L (ref 135–145)

## 2015-11-02 LAB — CBC
HCT: 38.6 % (ref 35.0–47.0)
Hemoglobin: 13.2 g/dL (ref 12.0–16.0)
MCH: 27.2 pg (ref 26.0–34.0)
MCHC: 34.3 g/dL (ref 32.0–36.0)
MCV: 79.2 fL — ABNORMAL LOW (ref 80.0–100.0)
PLATELETS: 374 10*3/uL (ref 150–440)
RBC: 4.87 MIL/uL (ref 3.80–5.20)
RDW: 21 % — ABNORMAL HIGH (ref 11.5–14.5)
WBC: 9.5 10*3/uL (ref 3.6–11.0)

## 2015-11-02 LAB — MAGNESIUM: MAGNESIUM: 2.1 mg/dL (ref 1.7–2.4)

## 2015-11-02 LAB — TSH: TSH: 1.237 u[IU]/mL (ref 0.350–4.500)

## 2015-11-02 MED ORDER — HYDROCORTISONE-ACETIC ACID 1-2 % OT SOLN: 3.0000 [drp] | Freq: Three times a day (TID) | OTIC | Status: DC

## 2015-11-02 MED ORDER — SODIUM CHLORIDE 0.9 % IV BOLUS (SEPSIS)
1000.0000 mL | Freq: Once | INTRAVENOUS | Status: AC
  Administered 2015-11-02: 1000 mL via INTRAVENOUS

## 2015-11-02 MED ORDER — CIPROFLOXACIN HCL 500 MG PO TABS
500.0000 mg | ORAL_TABLET | Freq: Two times a day (BID) | ORAL | Status: AC
Start: 2015-11-02 — End: 2015-11-12

## 2015-11-02 NOTE — Discharge Instructions (Signed)
You have been seen today in the Emergency Department (ED)  for syncope (passing out).  Your workup including labs and EKG did not show a cause for your syncope and were overall reassuring.   ° °Your symptoms may be due to dehydration, so it is important that you drink plenty of non-alcoholic fluids. Emotional stress, pain, or overheating--especially if you have been standing--can make you faint. In these cases, fainting is usually not serious. But fainting can be a sign of a more serious problem. Therefore it is imperative that you follow up with your doctor in 1-2 days for further evaluation. ° °When should you call for help?  °Call 911 anytime you think you may need emergency care. For example, call if:  °You have symptoms of a heart problem. These may include:  °Chest pain or pressure.  °Severe trouble breathing.  °A fast or irregular heartbeat.  °Lightheadedness or sudden weakness.  °Coughing up pink, foamy mucus.  °Passing out. °You have symptoms of a stroke. These may include:  °Sudden numbness, tingling, weakness, or loss of movement in your face, arm, or leg, especially on only one side of your body.  °Sudden vision changes.  °Sudden trouble speaking.  °Sudden confusion or trouble understanding simple statements.  °Sudden problems with walking or balance.  °A sudden, severe headache that is different from past headaches. °You passed out (lost consciousness) again. ° °Watch closely for changes in your health, and be sure to contact your doctor if:  °You do not get better as expected. ° ° How can you care for yourself at home?  °Drink plenty of fluids to prevent dehydration. If you have kidney, heart, or liver disease and have to limit fluids, talk with your doctor before you increase your fluid intake. ° ° °

## 2015-11-02 NOTE — ED Notes (Signed)
Pt in via triage; [facility].  Pt reports this is approximately the 8th time she has passed out since June.  Pt reports weakness, dizziness prior to syncopal episodes.  Pt reports feeling numb from the neck down; this has been an ongoing issue over the last few months in which she has had multiple MRI's indicating inflammation around a specific nerve.  Pt A/Ox4, vitals WDL.  Pt in no immediate distress at this time.

## 2015-11-02 NOTE — ED Provider Notes (Signed)
[facility] Emergency Department Provider Note  ____________________________________________  Time seen: Approximately 5:13 PM  I have reviewed the triage vital signs and the nursing notes.   HISTORY  Chief Complaint Loss of Consciousness   HPI Nancy York is a 70 y.o. female history of hypertension, hypothyroidism, anemia, malnutrition who presents for evaluation of syncope episode. Patient was at her primary care doctor today when she fell dizzy and had a syncopal episode. According to her husband this is her eighth episode of syncope over the course of the last month. Patient reports that since April she has developed generalized body numbness worse in her face. 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 Repeat MRI show resolution however patient continues to have symptoms. She reports that she has severe numbness in her face and has difficulty eating because she can't feel the food and then when she swallows she has a gag reflex and makes her vomit. She lost a lot of weight since March when the symptoms started. She also reports that she feels her whole body is numb although not as severe as her face. Over the course of the last month that she had a few episodes of bowel incontinence as she reports she doesn't feel she needs to go to the bathroom however these happened while coughing and on days where she took miralax for constipation. She denies back pain, urinary incontinence or retention, saddle anesthesia. She denies any trauma, headache, chest pain, shortness of breath, back pain. She denies facial droop, slurred speech, weakness. Patient is seen by neurology at [facility] with unclear etiology of her symptoms. Patient also complaining of one month of L ear discharge.   Past Medical History  Diagnosis Date  . Hypertension   . GERD (gastroesophageal reflux disease)   . Diverticulosis   . Heart murmur     no doctor follows her for  this  . Arthritis     joints, shoulder and knees    Patient Active Problem List   Diagnosis Date Noted  . Disease of thyroid gland 10/01/2015  . BP (high blood pressure) 10/01/2015  . HLD (hyperlipidemia) 10/01/2015  . Allergy to environmental factors 10/01/2015  . Pressure ulcer 09/24/2015  . Abscess and cellulitis   . Abscess 09/22/2015  . 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 Moderate protein-energy malnutrition (HCC) 08/10/2015  . Neuritis 08/05/2015  . Anemia in chronic illness 08/05/2015  . Episode of syncope 08/02/2015  . Closed fracture of distal end of femur (HCC) 08/02/2015  . Facial numbness 07/25/2015  . Can't get food down 07/25/2015  . Hyponatremia 07/18/2015  . Generalized weakness 07/18/2015  . GERD (gastroesophageal reflux disease) 07/18/2015  . HTN (hypertension) 07/18/2015  . Dehydration 07/18/2015  . 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 H/O total knee replacement 09/14/2014  . 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 OA (osteoarthritis) of knee 08/24/2014  . B12 deficiency 08/04/2014    Past Surgical History  Procedure Laterality Date  . Tonsillectomy    . Dilation and curettage of uterus    . Rotator cuff repair Right 2006    Dr. Gerrit Heck   . Knee arthroscopy Left   . Bilateral carpal tunnel release Bilateral   . 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 Reita Chard  . Fracture surgery Left 2007    fractured left  hip  . Joint replacement Right 08/2014    total knee replacement  .  Total hip arthroplasty Left 06/24/2015    Procedure: TOTAL HIP ARTHROPLASTY;  Surgeon: Donato Heinz, MD;  Location: [facility] ORS;  Service: Orthopedics;  Laterality: Left;  . Hardware removal Left 06/24/2015    Procedure: HARDWARE REMOVAL/ DHS;  Surgeon: Donato Heinz, MD;  Location: [facility] ORS;  Service: Orthopedics;  Laterality: Left;  . Irrigation and debridement abscess Right 09/24/2015    Procedure: IRRIGATION AND DEBRIDEMENT OF RIGHT  HIP ABSCESS;  Surgeon: Lattie Haw, MD;  Location: [facility] ORS;  Service: General;  Laterality: Right;    Current Outpatient Rx  Name  Route  Sig  Dispense  Refill  . acetaminophen (TYLENOL) 325 MG tablet   Oral   Take 1 tablet (325 mg total) by mouth every 6 (six) hours as needed for mild pain (or Fever >/= 101).         Marland Kitchen amLODipine (NORVASC) 10 MG tablet   Oral   Take 10 mg by mouth daily.         Marland Kitchen aspirin EC 81 MG tablet   Oral   Take 81 mg by mouth daily.         . calcium citrate-vitamin D (CITRACAL+D) 315-200 MG-UNIT per tablet   Oral   Take 1 tablet by mouth 3 (three) times daily.          Marland Kitchen dicyclomine (BENTYL) 20 MG tablet   Oral   Take 40 mg by mouth 4 (four) times daily.         . diphenhydrAMINE (BENADRYL) 25 mg capsule   Oral   Take 1 capsule (25 mg total) by mouth every 8 (eight) hours as needed for itching or allergies.   30 capsule   0   . LORazepam (ATIVAN) 0.5 MG tablet   Oral   Take 1 tablet (0.5 mg total) by mouth daily as needed for anxiety.   10 tablet   0   . meclizine (ANTIVERT) 25 MG tablet   Oral   Take 25 mg by mouth 3 (three) times daily as needed for dizziness.         . nystatin cream (MYCOSTATIN)   Topical   Apply 1 application topically.         . ondansetron (ZOFRAN-ODT) 8 MG disintegrating tablet   Oral   Take 8 mg by mouth every 8 (eight) hours as needed for nausea or vomiting. Reported on 09/29/2015         . polyethylene glycol (MIRALAX / GLYCOLAX) packet   Oral   Take 17 g by mouth daily as needed. Reported on 10/07/2015         . RABEprazole (ACIPHEX) 20 MG tablet   Oral   Take 20 mg by mouth 2 (two) times daily.         . ranitidine (ZANTAC) 150 MG tablet   Oral   Take 150 mg by mouth 2 (two) times daily.         Marland Kitchen scopolamine (TRANSDERM-SCOP) 1 MG/3DAYS   Transdermal   Place 1 patch onto the skin every 3 (three) days. Reported on 10/07/2015         . sodium chloride (OCEAN) 0.65 % SOLN  nasal spray   Each Nare   Place 1 spray into both nostrils as needed for congestion. Reported on 09/29/2015         . triamcinolone (NASACORT) 55 MCG/ACT AERO nasal inhaler   Nasal   Place 2 sprays into the nose.         Marland Kitchen  vitamin B-12 (CYANOCOBALAMIN) 500 MCG tablet   Oral   Take 500 mcg by mouth 2 (two) times daily.          . Vitamin D, Ergocalciferol, (DRISDOL) 50000 units CAPS capsule   Oral   Take 50,000 Units by mouth every 7 (seven) days. Pt takes on Sunday.         Marland Kitchen acetic acid-hydrocortisone (VOSOL-HC) otic solution   Left Ear   Place 3 drops into the left ear 3 (three) times daily.   10 mL   0   . ciprofloxacin (CIPRO) 500 MG tablet   Oral   Take 1 tablet (500 mg total) by mouth 2 (two) times daily.   20 tablet   0   . Ginkgo Biloba 120 MG CAPS   Oral   Take 120 mg by mouth daily. Reported on 11/02/2015         . Inulin (FIBER CHOICE FRUITY BITES PO)   Oral   Take 1 each by mouth at bedtime. Reported on 11/02/2015         . lactulose (CHRONULAC) 10 GM/15ML solution      Reported on 11/02/2015         . metoCLOPramide (REGLAN) 5 MG tablet   Oral   Take 5 mg by mouth every 6 (six) hours as needed. Reported on 11/02/2015         . Plant Sterols and Stanols (CHOLESTOFF PLUS) 450 MG CAPS   Oral   Take 900 mg by mouth 2 (two) times daily. Reported on 11/02/2015         . potassium chloride (KLOR-CON) 20 MEQ packet   Oral   Take 20 mEq by mouth daily. Reported on 11/02/2015           Allergies Statins; Sulfa antibiotics; Other; Penicillins; and Tape  Family History  Problem Relation Age of Onset  . Breast cancer Neg Hx   . Diabetes Father     Social History Social History  Substance Use Topics  . Smoking status: Never Smoker   . Smokeless tobacco: Never Used     Comment: no passive smoke in household  . Alcohol Use: No    Review of Systems Constitutional: Negative for fever. + syncope Eyes: Negative for visual changes. ENT:  Negative for sore throat. + L ear pain and discharge Cardiovascular: Negative for chest pain. Respiratory: Negative for shortness of breath. Gastrointestinal: Negative for abdominal pain, vomiting or diarrhea. Genitourinary: Negative for dysuria. Musculoskeletal: Negative for back pain. Skin: Negative for rash. Neurological: Negative for headaches, weakness. + numbness  ____________________________________________   PHYSICAL EXAM:  VITAL SIGNS: ED Triage Vitals  Enc Vitals Group     BP 11/02/15 1545 155/78 mmHg     Pulse Rate 11/02/15 1545 101     Resp 11/02/15 1545 16     Temp 11/02/15 1545 98.5 F (36.9 C)     Temp Source 11/02/15 1545 Oral     SpO2 11/02/15 1545 98 %     Weight 11/02/15 1545 137 lb (62.143 kg)     Height 11/02/15 1545 5\' 2"  (1.575 m)     Head Cir --      Peak Flow --      Pain Score 11/02/15 1546 0     Pain Loc --      Pain Edu? --      Excl. in GC? --     Constitutional: Alert and oriented. Well appearing and in no apparent distress.  HEENT:      Head: Normocephalic and atraumatic.         Eyes: Conjunctivae are normal. Sclera is non-icteric. EOMI. PERRL      Mouth/Throat: Mucous membranes are moist.       Ear: white discharge from left external ear canal, normal TM      Neck: Supple with no signs of meningismus. Cardiovascular: Regular rate and rhythm. No murmurs, gallops, or rubs. 2+ symmetrical distal pulses are present in all extremities. No JVD. Respiratory: Normal respiratory effort. Lungs are clear to auscultation bilaterally. No wheezes, crackles, or rhonchi.  Gastrointestinal: Soft, non tender, and non distended with positive bowel sounds. No rebound or guarding. Genitourinary: No suprapubic tenderness. No CVA tenderness. Musculoskeletal: Nontender with normal range of motion in all extremities. No edema, cyanosis, or erythema of extremities. Neurologic: A & O x3, PERRL, no nystagmus, CN II-XII intact, motor testing reveals good tone and bulk  throughout. There is no evidence of pronator drift or dysmetria. Muscle strength is 5/5 throughout. Deep tendon reflexes are 2+ throughout with downgoing toes. Decrease sensation on her face bilaterally. Gait is normal. Normal rectal tone and senation Skin: Skin is warm, dry and intact. No rash noted. Psychiatric: Mood and affect are normal. Speech and behavior are normal.  ____________________________________________   LABS (all labs ordered are listed, but only abnormal results are displayed)  Labs Reviewed  BASIC METABOLIC PANEL - Abnormal; Notable for the following:    Chloride 97 (*)    Glucose, Bld 117 (*)    Calcium 10.9 (*)    All other components within normal limits  CBC - Abnormal; Notable for the following:    MCV 79.2 (*)    RDW 21.0 (*)    All other components within normal limits  URINALYSIS COMPLETEWITH MICROSCOPIC ([facility] ONLY) - Abnormal; Notable for the following:    Color, Urine YELLOW (*)    APPearance CLOUDY (*)    Protein, ur 30 (*)    Squamous Epithelial / LPF 6-30 (*)    All other components within normal limits  MAGNESIUM  TSH   ____________________________________________  EKG  ED ECG REPORT I, Nita Sickle, the attending physician, personally viewed and interpreted this ECG.  Sinus tachycardia, rate 102, normal intervals, normal axis, no STE or depressions ____________________________________________  RADIOLOGY   none ____________________________________________   PROCEDURES  Procedure(s) performed: None Critical Care performed:  None ____________________________________________   INITIAL IMPRESSION / ASSESSMENT AND PLAN / ED COURSE   70 y.o. female history of hypertension, hypothyroidism, anemia, malnutrition who presents for evaluation of syncope episode. Patient has been struggling with diffuse numbness since March and has had 8 episodes of syncope over the course of the last month. She is followed by neurology at [facility] with no  etiology at this time for her symptoms. She has also developed malnutrition from being unable to eat since she is numb in her mouth and swallowing solids cause her a gag reflex. On exam she is neurologically intact with intact DTRs although does endorse decreased sensation on her face bilaterally. Rectal tone and sensation are present. EKG with no evidence of ischemia or any other arrhythmias. We'll check her electrolytes and CBC, EKG. Left ear with evidence of otitis externa. Patient currently on topical drops however unclear what kind. Will dc on acetic acid/ hydrocortisone drops and course of cipro.  _________________________ 5:29 PM on 11/02/2015 -----------------------------------------  Discussed patient with Neurology Dr. Desmond Lope however since patient's symptoms have been unchanged since March and her  most recent MRIs were negative he recommended against repeating MRIs at this time. He believes the patient is better off [facility]. Her labs are all with no other findings to justify her syncope episodes. She does look a little bit dehydrated so IV fluids will be ordered. Her hemoglobin is normal at 13.2, her kidney function is normal 0.61, her urinalysis is no evidence of UTI. No signs or symptoms of cauda equina with no back pain, no urinary incontinence, normal rectal tone. No evidence of stroke with diffuse numbness but no weakness and no neuro deficits. + DTRs making GBS less likely. Possibly MS. Will hydrate and dc with f[facility].  Pertinent labs & imaging results that were available during my care of the patient were reviewed by me and considered in my medical decision making (see chart for details).    ____________________________________________   FINAL CLINICAL IMPRESSION(S) / ED DIAGNOSES  Final diagnoses:  Syncope, unspecified syncope type  Otitis externa, left      NEW MEDICATIONS STARTED DURING THIS VISIT:  Discharge Medication List as of  11/02/2015  7:38 PM    START taking these medications   Details  acetic acid-hydrocortisone (VOSOL-HC) otic solution Place 3 drops into the left ear 3 (three) times daily., Starting 11/02/2015, Until Discontinued, Print    ciprofloxacin (CIPRO) 500 MG tablet Take 1 tablet (500 mg total) by mouth 2 (two) times daily., Starting 11/02/2015, Until Thu 11/12/15, Print         Note:  This document was prepared using Dragon voice recognition software and may include unintentional dictation errors.    Nita Sicklearolina Aceyn Kathol, MD 11/03/15 [phone]

## 2015-11-02 NOTE — ED Notes (Signed)
Patient presents to the ED [facility].  Patient's husband states this is the 8th syncopal episode since June and patient has had increased nausea and vomiting with smells since March.  Patient was recently in rehab after she broke her femur during a syncopal episode.

## 2015-11-27 ENCOUNTER — Other Ambulatory Visit: Payer: Self-pay | Admitting: *Deleted

## 2015-11-27 NOTE — Patient Outreach (Signed)
Telephone call successful.  Follow up on referral received 7/31 from  [facility] clinical social worker Kathrine Haddock - ongoing education and disease and symptom management- [facility] diagnosis readmission cellulitis 5/30-6/3.    Spoke with pt, HIPAA verified. Pt reports no recent hospitalizations, last one was for the cellulitis- no recurrences.  Discussed with pt this RN CM followed her for  (transition of care- last hospitalization) to which pt did recall.    Pt reports she is still not eating solid food, appetite not good, did gain one pound last week, current weight 133 lbs.  Pt reports diet consists of boost, tomato soup, carnation instant breakfast.  Pt reports HH PT finished, walking with her walker. Pt reports no skin breakdown.   RN CM discussed with pt recent ED visit 7/10- syncope (8 in one month) to which pt reports [facility], result negative, to f/u with [facility] in December.   Pt reports Palliative care came yesterday (assessment), to call her next week.  As discussed with pt- no community nurse case management needs at this time.  Pt has [facility] main contact number to call if community nurse Case management needs arise in the future.   Plan to follow up with [facility] clinical social worker- inform her pt has no community nurse case management needs at this time.   Shayne Alken.   Kierra Jezewski RN CCM [facility] Care Management  [phone]

## 2015-12-26 ENCOUNTER — Emergency Department: Payer: Commercial Managed Care - HMO

## 2015-12-26 ENCOUNTER — Encounter: Payer: Self-pay | Admitting: Emergency Medicine

## 2015-12-26 ENCOUNTER — Inpatient Hospital Stay
Admission: EM | Admit: 2015-12-26 | Discharge: 2015-12-28 | DRG: 689 | Disposition: A | Discharge status: Home Health | Payer: Commercial Managed Care - HMO | Attending: Internal Medicine | Admitting: Internal Medicine

## 2015-12-26 DIAGNOSIS — Z96651 Presence of right artificial knee joint: Secondary | ICD-10-CM | POA: Diagnosis present

## 2015-12-26 DIAGNOSIS — R410 Disorientation, unspecified: Secondary | ICD-10-CM

## 2015-12-26 DIAGNOSIS — G40909 Epilepsy, unspecified, not intractable, without status epilepticus: Secondary | ICD-10-CM | POA: Diagnosis present

## 2015-12-26 DIAGNOSIS — Z882 Allergy status to sulfonamides status: Secondary | ICD-10-CM | POA: Diagnosis not present

## 2015-12-26 DIAGNOSIS — E43 Unspecified severe protein-calorie malnutrition: Secondary | ICD-10-CM | POA: Diagnosis present

## 2015-12-26 DIAGNOSIS — Z6826 Body mass index (BMI) 26.0-26.9, adult: Secondary | ICD-10-CM

## 2015-12-26 DIAGNOSIS — N390 Urinary tract infection, site not specified: Secondary | ICD-10-CM | POA: Diagnosis not present

## 2015-12-26 DIAGNOSIS — W19XXXA Unspecified fall, initial encounter: Secondary | ICD-10-CM

## 2015-12-26 DIAGNOSIS — R680 Hypothermia, not associated with low environmental temperature: Secondary | ICD-10-CM | POA: Diagnosis present

## 2015-12-26 DIAGNOSIS — E860 Dehydration: Secondary | ICD-10-CM | POA: Diagnosis present

## 2015-12-26 DIAGNOSIS — G9340 Encephalopathy, unspecified: Secondary | ICD-10-CM | POA: Diagnosis present

## 2015-12-26 DIAGNOSIS — K219 Gastro-esophageal reflux disease without esophagitis: Secondary | ICD-10-CM | POA: Diagnosis present

## 2015-12-26 DIAGNOSIS — Z96642 Presence of left artificial hip joint: Secondary | ICD-10-CM | POA: Diagnosis present

## 2015-12-26 DIAGNOSIS — R531 Weakness: Secondary | ICD-10-CM

## 2015-12-26 DIAGNOSIS — Z79899 Other long term (current) drug therapy: Secondary | ICD-10-CM

## 2015-12-26 DIAGNOSIS — T68XXXA Hypothermia, initial encounter: Secondary | ICD-10-CM

## 2015-12-26 DIAGNOSIS — Z880 Allergy status to penicillin: Secondary | ICD-10-CM | POA: Diagnosis not present

## 2015-12-26 DIAGNOSIS — Z833 Family history of diabetes mellitus: Secondary | ICD-10-CM

## 2015-12-26 DIAGNOSIS — Z888 Allergy status to other drugs, medicaments and biological substances status: Secondary | ICD-10-CM

## 2015-12-26 DIAGNOSIS — B370 Candidal stomatitis: Secondary | ICD-10-CM | POA: Diagnosis present

## 2015-12-26 DIAGNOSIS — Z7982 Long term (current) use of aspirin: Secondary | ICD-10-CM | POA: Diagnosis not present

## 2015-12-26 DIAGNOSIS — I10 Essential (primary) hypertension: Secondary | ICD-10-CM | POA: Diagnosis present

## 2015-12-26 DIAGNOSIS — R0902 Hypoxemia: Secondary | ICD-10-CM | POA: Diagnosis present

## 2015-12-26 DIAGNOSIS — R569 Unspecified convulsions: Secondary | ICD-10-CM | POA: Diagnosis not present

## 2015-12-26 LAB — URINALYSIS COMPLETE WITH MICROSCOPIC (ARMC ONLY)
BILIRUBIN URINE: NEGATIVE
Glucose, UA: NEGATIVE mg/dL
Hgb urine dipstick: NEGATIVE
Ketones, ur: NEGATIVE mg/dL
Leukocytes, UA: NEGATIVE
NITRITE: NEGATIVE
PH: 6 (ref 5.0–8.0)
Protein, ur: NEGATIVE mg/dL
Specific Gravity, Urine: 1.016 (ref 1.005–1.030)

## 2015-12-26 LAB — COMPREHENSIVE METABOLIC PANEL
ALBUMIN: 3.5 g/dL (ref 3.5–5.0)
ALK PHOS: 74 U/L (ref 38–126)
ALT: 29 U/L (ref 14–54)
AST: 31 U/L (ref 15–41)
Anion gap: 3 — ABNORMAL LOW (ref 5–15)
BUN: 20 mg/dL (ref 6–20)
CALCIUM: 10.6 mg/dL — AB (ref 8.9–10.3)
CO2: 41 mmol/L — AB (ref 22–32)
CREATININE: 0.82 mg/dL (ref 0.44–1.00)
Chloride: 97 mmol/L — ABNORMAL LOW (ref 101–111)
GFR calc Af Amer: >60 mL/min (ref >60)
GFR calc non Af Amer: >60 mL/min (ref >60)
GLUCOSE: 126 mg/dL — AB (ref 65–99)
Potassium: 4.1 mmol/L (ref 3.5–5.1)
SODIUM: 141 mmol/L (ref 135–145)
Total Bilirubin: 0.3 mg/dL (ref 0.3–1.2)
Total Protein: 7.4 g/dL (ref 6.5–8.1)

## 2015-12-26 LAB — CBC WITH DIFFERENTIAL/PLATELET
BASOS PCT: 1 %
Basophils Absolute: 0.1 10*3/uL (ref 0–0.1)
EOS ABS: 0.1 10*3/uL (ref 0–0.7)
Eosinophils Relative: 2 %
HCT: 36.6 % (ref 35.0–47.0)
Hemoglobin: 12.3 g/dL (ref 12.0–16.0)
Lymphocytes Relative: 25 %
Lymphs Abs: 2.1 10*3/uL (ref 1.0–3.6)
MCH: 28.4 pg (ref 26.0–34.0)
MCHC: 33.6 g/dL (ref 32.0–36.0)
MCV: 84.3 fL (ref 80.0–100.0)
MONO ABS: 0.5 10*3/uL (ref 0.2–0.9)
MONOS PCT: 6 %
Neutro Abs: 5.4 10*3/uL (ref 1.4–6.5)
Neutrophils Relative %: 66 %
Platelets: 385 10*3/uL (ref 150–440)
RBC: 4.34 MIL/uL (ref 3.80–5.20)
RDW: 19 % — AB (ref 11.5–14.5)
WBC: 8.1 10*3/uL (ref 3.6–11.0)

## 2015-12-26 LAB — LACTIC ACID, PLASMA: LACTIC ACID, VENOUS: 1 mmol/L (ref 0.5–1.9)

## 2015-12-26 LAB — MRSA PCR SCREENING: MRSA by PCR: POSITIVE — AB

## 2015-12-26 LAB — TROPONIN I

## 2015-12-26 MED ORDER — LORATADINE 10 MG PO TABS
10.0000 mg | ORAL_TABLET | Freq: Every day | ORAL | Status: DC
  Administered 2015-12-26 – 2015-12-28 (×3): 10 mg via ORAL
  Filled 2015-12-26 (×3): qty 1

## 2015-12-26 MED ORDER — NYSTATIN 100000 UNIT/ML MT SUSP
5.0000 mL | Freq: Four times a day (QID) | OROMUCOSAL | Status: DC
  Administered 2015-12-26 – 2015-12-28 (×8): 500000 [IU] via ORAL
  Filled 2015-12-26 (×8): qty 5

## 2015-12-26 MED ORDER — PANTOPRAZOLE SODIUM 40 MG PO TBEC
40.0000 mg | DELAYED_RELEASE_TABLET | Freq: Every day | ORAL | Status: DC
  Administered 2015-12-26 – 2015-12-28 (×3): 40 mg via ORAL
  Filled 2015-12-26 (×3): qty 1

## 2015-12-26 MED ORDER — NYSTATIN 100000 UNIT/GM EX CREA
1.0000 "application " | TOPICAL_CREAM | Freq: Every day | CUTANEOUS | Status: DC | PRN
  Filled 2015-12-26: qty 15

## 2015-12-26 MED ORDER — ENOXAPARIN SODIUM 40 MG/0.4ML ~~LOC~~ SOLN
40.0000 mg | SUBCUTANEOUS | Status: DC
  Administered 2015-12-26 – 2015-12-27 (×2): 40 mg via SUBCUTANEOUS
  Filled 2015-12-26 (×2): qty 0.4

## 2015-12-26 MED ORDER — LORAZEPAM 0.5 MG PO TABS: 0.5000 mg | ORAL_TABLET | Freq: Every day | ORAL | Status: DC | PRN

## 2015-12-26 MED ORDER — AMLODIPINE BESYLATE 5 MG PO TABS
5.0000 mg | ORAL_TABLET | Freq: Every day | ORAL | Status: DC
  Administered 2015-12-26 – 2015-12-27 (×2): 5 mg via ORAL
  Filled 2015-12-26 (×2): qty 1

## 2015-12-26 MED ORDER — ACETAMINOPHEN 325 MG PO TABS: 325.0000 mg | ORAL_TABLET | Freq: Four times a day (QID) | ORAL | Status: DC | PRN

## 2015-12-26 MED ORDER — VITAMIN B-12 1000 MCG PO TABS
1000.0000 ug | ORAL_TABLET | ORAL | Status: DC
  Administered 2015-12-27 – 2015-12-28 (×2): 1000 ug via ORAL
  Filled 2015-12-26 (×2): qty 1

## 2015-12-26 MED ORDER — SODIUM CHLORIDE 0.9 % IV BOLUS (SEPSIS)
500.0000 mL | Freq: Once | INTRAVENOUS | Status: AC
  Administered 2015-12-26: 500 mL via INTRAVENOUS

## 2015-12-26 MED ORDER — VITAMIN D (ERGOCALCIFEROL) 1.25 MG (50000 UNIT) PO CAPS
50000.0000 [IU] | ORAL_CAPSULE | ORAL | Status: DC
  Administered 2015-12-27: 11:00:00 50000 [IU] via ORAL
  Filled 2015-12-26: qty 1

## 2015-12-26 MED ORDER — FLUCONAZOLE 100 MG PO TABS
150.0000 mg | ORAL_TABLET | Freq: Once | ORAL | Status: AC
  Administered 2015-12-26: 150 mg via ORAL
  Filled 2015-12-26: qty 2

## 2015-12-26 MED ORDER — ASPIRIN EC 81 MG PO TBEC
81.0000 mg | DELAYED_RELEASE_TABLET | ORAL | Status: DC
  Administered 2015-12-27 – 2015-12-28 (×2): 81 mg via ORAL
  Filled 2015-12-26 (×2): qty 1

## 2015-12-26 MED ORDER — MECLIZINE HCL 25 MG PO TABS: 25.0000 mg | ORAL_TABLET | Freq: Three times a day (TID) | ORAL | Status: DC | PRN

## 2015-12-26 MED ORDER — SODIUM CHLORIDE 0.9% FLUSH
3.0000 mL | Freq: Two times a day (BID) | INTRAVENOUS | Status: DC
  Administered 2015-12-26 – 2015-12-28 (×3): 3 mL via INTRAVENOUS

## 2015-12-26 MED ORDER — IOPAMIDOL (ISOVUE-370) INJECTION 76%
75.0000 mL | Freq: Once | INTRAVENOUS | Status: AC | PRN
  Administered 2015-12-26: 60 mL via INTRAVENOUS

## 2015-12-26 MED ORDER — ONDANSETRON HCL 4 MG PO TABS: 8.0000 mg | ORAL_TABLET | Freq: Three times a day (TID) | ORAL | Status: DC | PRN

## 2015-12-26 MED ORDER — LEVETIRACETAM 750 MG PO TABS
750.0000 mg | ORAL_TABLET | Freq: Two times a day (BID) | ORAL | Status: DC
  Administered 2015-12-26 – 2015-12-28 (×5): 750 mg via ORAL
  Filled 2015-12-26 (×6): qty 1

## 2015-12-26 MED ORDER — DIPHENHYDRAMINE HCL 25 MG PO CAPS: 25.0000 mg | ORAL_CAPSULE | Freq: Three times a day (TID) | ORAL | Status: DC | PRN

## 2015-12-26 MED ORDER — IPRATROPIUM-ALBUTEROL 0.5-2.5 (3) MG/3ML IN SOLN
3.0000 mL | Freq: Once | RESPIRATORY_TRACT | Status: AC
  Administered 2015-12-26: 3 mL via RESPIRATORY_TRACT
  Filled 2015-12-26: qty 3

## 2015-12-26 MED ORDER — HYDROCORTISONE-ACETIC ACID 1-2 % OT SOLN: 3.0000 [drp] | Freq: Three times a day (TID) | OTIC | Status: DC

## 2015-12-26 MED ORDER — DICYCLOMINE HCL 20 MG PO TABS
40.0000 mg | ORAL_TABLET | Freq: Four times a day (QID) | ORAL | Status: DC | PRN
  Filled 2015-12-26: qty 2

## 2015-12-26 MED ORDER — POTASSIUM CHLORIDE CRYS ER 20 MEQ PO TBCR
20.0000 meq | EXTENDED_RELEASE_TABLET | Freq: Every day | ORAL | Status: DC
  Administered 2015-12-26 – 2015-12-27 (×2): 20 meq via ORAL
  Filled 2015-12-26 (×2): qty 1

## 2015-12-26 MED ORDER — SODIUM CHLORIDE 0.9 % IV SOLN
INTRAVENOUS | Status: DC
  Administered 2015-12-26 – 2015-12-27 (×2): via INTRAVENOUS

## 2015-12-26 NOTE — ED Notes (Signed)
Bear hugger started on low, primary rn in room. Dr. Zenda AlpersWebster notified of pt's presentation.

## 2015-12-26 NOTE — H&P (Signed)
Nancy York is an 70 y.o. female.   Chief Complaint: Weakness HPI: Found acting funny last night. Very weak. Was not acute, this has been worsening over past few days. She has had hip fx on 3/1 and an abces drained from her buttocks 5/30. She was recently started on Keppra and dose increase for possible seizure disorder. No report of seizures since. Poor PO intake of recent and was dx with oral thrush about 1 week ago. Found to be hypoxic in ED and mildly hypothermic.  Past Medical History:  Diagnosis Date  . Arthritis    joints, shoulder and knees  . Diverticulosis   . GERD (gastroesophageal reflux disease)   . Heart murmur    no doctor follows her for this  . Hypertension     Past Surgical History:  Procedure Laterality Date  . BILATERAL CARPAL TUNNEL RELEASE Bilateral   . DILATION AND CURETTAGE OF UTERUS    . FRACTURE SURGERY Left 2007   fractured left  hip  . HARDWARE REMOVAL Left 06/24/2015   Procedure: HARDWARE REMOVAL/ [facility];  Surgeon: Dereck Leep, MD;  Location: [facility] ORS;  Service: Orthopedics;  Laterality: Left;  . IRRIGATION AND DEBRIDEMENT ABSCESS Right 09/24/2015   Procedure: IRRIGATION AND DEBRIDEMENT OF RIGHT HIP ABSCESS;  Surgeon: Florene Glen, MD;  Location: [facility];  Service: General;  Laterality: Right;  . JOINT REPLACEMENT Right 08/2014   total knee replacement  . KNEE ARTHROSCOPY Left   . ROTATOR CUFF REPAIR Right 2006   Dr. Mauri Pole   . TONSILLECTOMY    . TOTAL HIP ARTHROPLASTY Left 06/24/2015   Procedure: TOTAL HIP ARTHROPLASTY;  Surgeon: Dereck Leep, MD;  Location: [facility] OR[facility];  Service: Orthopedics;  Laterality: Left;  . TOTAL KNEE ARTHROPLASTY Right 08/25/2014   Dr. Margaretmary Eddy    Family History  Problem Relation Age of Onset  . Diabetes Father   . Breast cancer Neg Hx    Social History:  reports that she has never smoked. She has never used smokeless tobacco. She reports that she does not drink alcohol or use drugs.  Allergies:  Allergies   Allergen Reactions  . Statins Other (See Comments)    Reaction:  Leg cramps   . Sulfa Antibiotics Nausea Only  . Other Rash    "leg cramps"  . Penicillins Rash and Other (See Comments)    Has patient had a PCN reaction causing immediate rash, facial/tongue/throat swelling, SOB or lightheadedness with hypotension: No Has patient had a PCN reaction causing severe rash involving mucus membranes or skin necrosis: No Has patient had a PCN reaction that required hospitalization No Has patient had a PCN reaction occurring within the last 10 years: Yes If all of the above answers are "NO", then may proceed with Cephalosporin use.  . Tape Rash     (Not in a hospital admission)  Results for orders placed or performed during the hospital encounter of 12/26/15 (from the past 48 hour(s))  Comprehensive metabolic panel     Status: Abnormal   Collection Time: 12/26/15  3:33 AM  Result Value Ref Range   Sodium 141 135 - 145 mmol/L   Potassium 4.1 3.5 - 5.1 mmol/L   Chloride 97 (L) 101 - 111 mmol/L   CO2 41 (H) 22 - 32 mmol/L   Glucose, Bld 126 (H) 65 - 99 mg/dL   BUN 20 6 - 20 mg/dL   Creatinine, Ser 0.82 0.44 - 1.00 mg/dL   Calcium 10.6 (H) 8.9 -  10.3 mg/dL   Total Protein 7.4 6.5 - 8.1 g/dL   Albumin 3.5 3.5 - 5.0 g/dL   AST 31 15 - 41 U/L   ALT 29 14 - 54 U/L   Alkaline Phosphatase 74 38 - 126 U/L   Total Bilirubin 0.3 0.3 - 1.2 mg/dL   GFR calc non Af Amer >60 >60 mL/min   GFR calc Af Amer >60 >60 mL/min    Comment: (NOTE) The eGFR has been calculated using the CKD EPI equation. This calculation has not been validated in all clinical situations. eGFR's persistently <60 mL/min signify possible Chronic Kidney Disease.    Anion gap 3 (L) 5 - 15  CBC with Differential     Status: Abnormal   Collection Time: 12/26/15  3:33 AM  Result Value Ref Range   WBC 8.1 3.6 - 11.0 K/uL   RBC 4.34 3.80 - 5.20 MIL/uL   Hemoglobin 12.3 12.0 - 16.0 g/dL   HCT 36.6 35.0 - 47.0 %   MCV 84.3 80.0  - 100.0 fL   MCH 28.4 26.0 - 34.0 pg   MCHC 33.6 32.0 - 36.0 g/dL   RDW 19.0 (H) 11.5 - 14.5 %   Platelets 385 150 - 440 K/uL   Neutrophils Relative % 66 %   Neutro Abs 5.4 1.4 - 6.5 K/uL   Lymphocytes Relative 25 %   Lymphs Abs 2.1 1.0 - 3.6 K/uL   Monocytes Relative 6 %   Monocytes Absolute 0.5 0.2 - 0.9 K/uL   Eosinophils Relative 2 %   Eosinophils Absolute 0.1 0 - 0.7 K/uL   Basophils Relative 1 %   Basophils Absolute 0.1 0 - 0.1 K/uL  Troponin I     Status: None   Collection Time: 12/26/15  3:33 AM  Result Value Ref Range   Troponin I <0.03 <0.03 ng/mL  Lactic acid, plasma     Status: None   Collection Time: 12/26/15  3:49 AM  Result Value Ref Range   Lactic Acid, Venous 1.0 0.5 - 1.9 mmol/L  Urinalysis complete, with microscopic     Status: Abnormal   Collection Time: 12/26/15  6:18 AM  Result Value Ref Range   Color, Urine YELLOW (A) YELLOW   APPearance CLOUDY (A) CLEAR   Glucose, UA NEGATIVE NEGATIVE mg/dL   Bilirubin Urine NEGATIVE NEGATIVE   Ketones, ur NEGATIVE NEGATIVE mg/dL   Specific Gravity, Urine 1.016 1.005 - 1.030   Hgb urine dipstick NEGATIVE NEGATIVE   pH 6.0 5.0 - 8.0   Protein, ur NEGATIVE NEGATIVE mg/dL   Nitrite NEGATIVE NEGATIVE   Leukocytes, UA NEGATIVE NEGATIVE   RBC / HPF 0-5 0 - 5 RBC/hpf   WBC, UA 6-30 0 - 5 WBC/hpf   Bacteria, UA RARE (A) NONE SEEN   Squamous Epithelial / LPF 0-5 (A) NONE SEEN   Mucous PRESENT    Hyaline Casts, UA PRESENT    Amorphous Crystal PRESENT   Blood gas, arterial     Status: Abnormal (Preliminary result)   Collection Time: 12/26/15  8:23 AM  Result Value Ref Range   FIO2 0.21    Delivery systems PENDING    Inspiratory PAP PENDING    Expiratory PAP PENDING    pH, Arterial 7.42 7.350 - 7.450   pCO2 arterial 62 (H) 32.0 - 48.0 mmHg   pO2, Arterial 47 (L) 83.0 - 108.0 mmHg   Bicarbonate 40.2 (H) 20.0 - 28.0 mmol/L   Acid-Base Excess 13.3 (H) 0.0 - 2.0 mmol/L  O2 Saturation 83.4 %   Patient temperature 37.0     Oxygen index PENDING    Collection site RIGHT BRACHIAL    Sample type ARTERIAL DRAW    Allens test (pass/fail) FAIL (A) PASS   Ct Head Wo Contrast  Result Date: 12/26/2015 CLINICAL DATA:  Status post fall, landing on floor. Slurred speech. Initial encounter. EXAM: CT HEAD WITHOUT CONTRAST TECHNIQUE: Contiguous axial images were obtained from the base of the skull through the vertex without intravenous contrast. COMPARISON:  CT of the head performed 08/31/2015 FINDINGS: Brain: There is no evidence of acute infarction, mass lesion, or intra- or extra-axial hemorrhage on CT. Scattered periventricular and subcortical white matter change likely reflects small vessel ischemic microangiopathy. The brainstem and fourth ventricle are within normal limits. The basal ganglia are unremarkable in appearance. The cerebral hemispheres demonstrate grossly normal gray-white differentiation. No mass effect or midline shift is seen. Vascular: No hyperdense vessel or unexpected calcification. Skull: There is no evidence of fracture; visualized osseous structures are unremarkable in appearance. Sinuses/Orbits: The visualized portions of the orbits are within normal limits. The paranasal sinuses and mastoid air cells are well-aerated. Other: No significant soft tissue abnormalities are seen. IMPRESSION: 1. No evidence of traumatic intracranial injury or fracture. 2. Scattered small vessel ischemic microangiopathy. Electronically Signed   By: Garald Balding M.D.   On: 12/26/2015 04:40   Dg Chest Portable 1 View  Result Date: 12/26/2015 CLINICAL DATA:  Pain with breathing and hypoxia EXAM: PORTABLE CHEST 1 VIEW COMPARISON:  March 31, 2007 FINDINGS: The heart size and mediastinal contours are within normal limits. There is no focal infiltrate, pulmonary edema, or pleural effusion. The visualized skeletal structures are stable. IMPRESSION: No active cardiopulmonary disease. Electronically Signed   By: Abelardo Diesel M.D.   On:  12/26/2015 07:11    Review of Systems  Constitutional: Positive for weight loss. Negative for chills and fever.  HENT: Negative for hearing loss.   Eyes: Negative for blurred vision.  Respiratory: Negative for cough.   Cardiovascular: Negative for chest pain.  Gastrointestinal: Negative for nausea and vomiting.  Genitourinary: Negative for dysuria.  Musculoskeletal: Positive for joint pain.  Skin: Negative for rash.  Neurological: Positive for tremors and weakness.    Blood pressure (!) 164/66, pulse (!) 108, temperature 98.1 F (36.7 C), temperature source Oral, resp. rate 18, height 5' 2"  (1.575 m), weight 60.8 kg (134 lb 2 oz), SpO2 98 %. Physical Exam  Constitutional: She is oriented to person, place, and time.  Ill appearing in no acute distress  HENT:  Head: Normocephalic and atraumatic.  White plaques on tongue and oral mucosa  Eyes: EOM are normal. Pupils are equal, round, and reactive to light. No scleral icterus.  Neck: Normal range of motion. Neck supple. No JVD present. No tracheal deviation present. No thyromegaly present.  Cardiovascular: Normal rate.   No murmur heard. Respiratory:  Clear to ascultation No dullness to percusion No use accessary muscles  GI: Soft. Bowel sounds are normal. She exhibits no mass. There is no tenderness.  Musculoskeletal: She exhibits no edema or deformity.  Lymphadenopathy:    She has no cervical adenopathy.  Neurological: She is alert and oriented to person, place, and time. No cranial nerve deficit.  Skin: Skin is warm and dry. No rash noted.     Assessment/Plan 1. Hypoxia: At this point unexplained. CT scan of chest shows no underlying cause. Possibly secondary to overall deconditioning of patient. If does not improve then consider  pulmonary consult.  2. Generalized Weakness: Patient has been declining as of recent. Has had multiple hospitalizations this year. Will have PT evaluate for needs.  3. Oral Thrush: Probably  contributing to poor PO intake. On magic mouthwash. Will give dose of diflucan.  4. Dehydration: IVF.  5. Weight Loss: Could represent malnutrition. Will have nutrition evaluate.  6. ?Seizure Disorder: EEG was normal. Followed by neurology. May want to consider some of her issues potential side effects of Keppra. Do not want to d/c abrutly without neurology input.  7. Mild Hypothermia: No sign of infection on work up. Easly warmed by bear hugger. Monitor.  Total time spent= 60 min  Baxter Hire, MD 12/26/2015, 11:59 AM

## 2015-12-26 NOTE — ED Notes (Signed)
Per pt's husband. Pt has decreased ambulation (pt was able to ambulate with walker, now pt can't ambulate), pt was unable to assist with movement when arrived to ED. Pt normally has intermittent slurred speech X 2-3 weeks. Pt had been instructed to drink soups, boost, gatorade instead of solid diet due to inability to chew/control mouth. Pt's oral intake has drastically reduced over the last 2 weeks - pt is consuming approx 1/3 of former diet. Pt's family denies fever. Pt was put on 500 mg Keppra beginning of August. Pt's dose was increased to 750 mg middle of August. Pt had been vomiting repeatedly prior to Keppra dose increase. Pt has only vomited twice since increase in dose.   Tonight pt completely unable to help spouse with movement on/off toilet. Pt slid to floor.   Pt had left hip replacement March 1st. Two weeks after surgery pt had cellulitis at incision site. Pt went through 2 rounds of Bactrim outpatient for infection. 1 week after pt began to have repeated emeses. Pt was brought into hospital for hyponatremia. Pt was also having fecal incontince. Pt had been admitted on a Saturday; Pt woke up Monday unable to feel mouth, and have slightly slurred speech. Pt was taken to [facility] 1st of April b/c she chewed her tongue due to inability to feel mouth. Pt admitted and kept 3 days. Pt was home 1 week, and passed out, broke left femur. Pt was readmitted for broke femur.

## 2015-12-26 NOTE — ED Notes (Signed)
MD Webster at bedside 

## 2015-12-26 NOTE — ED Triage Notes (Signed)
Per family pt slipped out of husband's arms tonight while attempting to go to restroom, falling, landing on floor. Husband denies loc or striking head. Pt with slurred speech, will not bear weight on legs. Daughter states pt with intermittent slurred speech this week. Pt's husband states "she took an anxiety pill already."

## 2015-12-26 NOTE — ED Notes (Signed)
Patient taken off O2 per Dr. Zenda AlpersWebster for ABG
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
## 2015-12-26 NOTE — ED Provider Notes (Signed)
[facility] Emergency Department Provider Note   ____________________________________________   First MD Initiated Contact with Patient 12/26/15 0335     (approximate)  I have reviewed the triage vital signs and the nursing notes.   HISTORY  Chief Complaint Fall    HPI Nancy York is a 70 y.o. female who comes into the hospital today after a fall. The patient's husband reports that she has been here several times since March. She had a hip replacement in March and developed cellulitis. She also since then developed some numbness in her face and some increased speech difficulties and weakness. He reports that tonight she needed to use the restroom. He had given her a pill for anxiety earlier in the evening which has caused some of these problems in the past. The patient's husband reports that as he was getting her ready to use the bathroom her legs went out from under her and she just stopped her weight. He was unable to pick her up reports that he was able to slide her down onto the floor and then lay her down. She did not hit her head but she did not remember what was going on after she was laid on the floor and other family and friends came to check on her. According to the patient's spouse and her family she appeared to be foaming at the mouth. She's had no fevers, chest pain, shortness of breath. The patient has been drinking less fluid in the past and has had some low sodiums. She was vomiting on Monday and saw her doctor on Tuesday. She was told that everything was fine and was discharged from her physician's office to home. The family is concerned given her confusion and her weakness so they decided to bring her into the hospital. They report that she hasn't been walking like normal and she hasn't been doing things like normal.   Past Medical History:  Diagnosis Date  . Arthritis    joints, shoulder and knees  . Diverticulosis   . GERD  (gastroesophageal reflux disease)   . Heart murmur    no doctor follows her for this  . Hypertension     Patient Active Problem List   Diagnosis Date Noted  . Disease of thyroid gland 10/01/2015  . BP (high blood pressure) 10/01/2015  . HLD (hyperlipidemia) 10/01/2015  . Allergy to environmental factors 10/01/2015  . Pressure ulcer 09/24/2015  . Abscess and cellulitis   . Abscess 09/22/2015  . 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 Moderate protein-energy malnutrition (HCC) 08/10/2015  . Neuritis 08/05/2015  . Anemia in chronic illness 08/05/2015  . Episode of syncope 08/02/2015  . Closed fracture of distal end of femur (HCC) 08/02/2015  . Facial numbness 07/25/2015  . Can't get food down 07/25/2015  . Hyponatremia 07/18/2015  . Generalized weakness 07/18/2015  . GERD (gastroesophageal reflux disease) 07/18/2015  . HTN (hypertension) 07/18/2015  . Dehydration 07/18/2015  . 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 H/O total knee replacement 09/14/2014  . 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 OA (osteoarthritis) of knee 08/24/2014  . B12 deficiency 08/04/2014    Past Surgical History:  Procedure Laterality Date  . BILATERAL CARPAL TUNNEL RELEASE Bilateral   . DILATION AND CURETTAGE OF UTERUS    . FRACTURE SURGERY Left 2007   fractured left  hip  . HARDWARE REMOVAL Left 06/24/2015   Procedure: HARDWARE REMOVAL/ DHS;  Surgeon: Donato Heinz, MD;  Location: [facility] ORS;  Service: Orthopedics;  Laterality: Left;  . IRRIGATION AND DEBRIDEMENT ABSCESS Right 09/24/2015   Procedure: IRRIGATION AND DEBRIDEMENT OF RIGHT HIP ABSCESS;  Surgeon: Lattie Hawichard E Cooper, MD;  Location: [facility] ORS;  Service: General;  Laterality: Right;  . JOINT REPLACEMENT Right 08/2014   total knee replacement  . KNEE ARTHROSCOPY Left   . ROTATOR CUFF REPAIR Right 2006   Dr. Gerrit Heckaliff   . TONSILLECTOMY    . TOTAL HIP ARTHROPLASTY Left 06/24/2015   Procedure: TOTAL HIP ARTHROPLASTY;   Surgeon: Donato HeinzJames P Hooten, MD;  Location: [facility] ORS;  Service: Orthopedics;  Laterality: Left;  . TOTAL KNEE ARTHROPLASTY Right 08/25/2014   Dr. Reita Chardhris Smith    Prior to Admission medications   Medication Sig Start Date End Date Taking? Authorizing Provider  acetaminophen (TYLENOL) 325 MG tablet Take 1 tablet (325 mg total) by mouth every 6 (six) hours as needed for mild pain (or Fever >/= 101). 07/21/15   Ramonita LabAruna Gouru, MD  acetic acid-hydrocortisone (VOSOL-HC) otic solution Place 3 drops into the left ear 3 (three) times daily. 11/02/15   Nita Sicklearolina Veronese, MD  amLODipine (NORVASC) 10 MG tablet Take 10 mg by mouth daily.    Historical Provider, MD  aspirin EC 81 MG tablet Take 81 mg by mouth daily.    Historical Provider, MD  calcium citrate-vitamin D (CITRACAL+D) 315-200 MG-UNIT per tablet Take 1 tablet by mouth 3 (three) times daily.     Historical Provider, MD  dicyclomine (BENTYL) 20 MG tablet Take 40 mg by mouth 4 (four) times daily.    Historical Provider, MD  diphenhydrAMINE (BENADRYL) 25 mg capsule Take 1 capsule (25 mg total) by mouth every 8 (eight) hours as needed for itching or allergies. 07/21/15   Ramonita LabAruna Gouru, MD  Ginkgo Biloba 120 MG CAPS Take 120 mg by mouth daily. Reported on 11/02/2015    Historical Provider, MD  Inulin (FIBER CHOICE FRUITY BITES PO) Take 1 each by mouth at bedtime. Reported on 11/02/2015    Historical Provider, MD  lactulose [facility](CHRONULAC) 10 GM/15ML solution Reported on 11/02/2015 07/16/15   Historical Provider, MD  LORazepam (ATIVAN) 0.5 MG tablet Take 1 tablet (0.5 mg total) by mouth daily as needed for anxiety. 09/26/15   Milagros LollSrikar Sudini, MD  meclizine (ANTIVERT) 25 MG tablet Take 25 mg by mouth 3 (three) times daily as needed for dizziness.    Historical Provider, MD  nystatin cream (MYCOSTATIN) Apply 1 application topically.    Historical Provider, MD  ondansetron (ZOFRAN-ODT) 8 MG disintegrating tablet Take 8 mg by mouth every 8 (eight) hours as needed for nausea or  vomiting. Reported on 09/29/2015    Historical Provider, MD  Plant Sterols and Stanols (CHOLESTOFF PLUS) 450 MG CAPS Take 900 mg by mouth 2 (two) times daily. Reported on 11/02/2015    Historical Provider, MD  polyethylene glycol (MIRALAX / GLYCOLAX) packet Take 17 g by mouth daily as needed. Reported on 10/07/2015    Historical Provider, MD  potassium chloride (KLOR-CON) 20 MEQ packet Take 20 mEq by mouth daily. Reported on 11/02/2015    Historical Provider, MD  RABEprazole (ACIPHEX) 20 MG tablet Take 20 mg by mouth 2 (two) times daily.    Historical Provider, MD  ranitidine (ZANTAC) 150 MG tablet Take 150 mg by mouth 2 (two) times daily.    Historical Provider, MD  scopolamine (TRANSDERM-SCOP) 1 MG/3DAYS Place 1 patch onto the skin every 3 (three) days. Reported on 10/07/2015    Historical Provider, MD  sodium chloride (OCEAN) 0.65 % SOLN nasal spray Place 1 spray into both nostrils as needed for congestion. Reported on 09/29/2015    Historical Provider, MD  triamcinolone (NASACORT) 55 MCG/ACT AERO nasal inhaler Place 2 sprays into the nose.    Historical Provider, MD  vitamin B-12 (CYANOCOBALAMIN) 500 MCG tablet Take 500 mcg by mouth 2 (two) times daily.     Historical Provider, MD  Vitamin D, Ergocalciferol, (DRISDOL) 50000 units CAPS capsule Take 50,000 Units by mouth every 7 (seven) days. Pt takes on Sunday.    Historical Provider, MD    Allergies Statins; Sulfa antibiotics; Other; Penicillins; and Tape  Family History  Problem Relation Age of Onset  . Diabetes Father   . Breast cancer Neg Hx     Social History Social History  Substance Use Topics  . Smoking status: Never Smoker  . Smokeless tobacco: Never Used     Comment: no passive smoke in household  . Alcohol use No    Review of Systems Constitutional: No fever/chills Eyes: No visual changes. ENT: No sore throat. Cardiovascular: Denies chest pain. Respiratory: Denies shortness of breath. Gastrointestinal: Vomiting No abdominal  pain, No diarrhea.  No constipation. Genitourinary: Negative for dysuria. Musculoskeletal: Leg pain Skin: Negative for rash. Neurological: Confusion and weakness  10-point ROS otherwise negative.  ____________________________________________   PHYSICAL EXAM:  VITAL SIGNS: ED Triage Vitals  Enc Vitals Group     BP 12/26/15 0321 112/84     Pulse Rate 12/26/15 0321 (!) 107     Resp 12/26/15 0330 (!) 21     Temp 12/26/15 0329 (!) 95.8 F (35.4 C)     Temp Source 12/26/15 0321 Axillary     SpO2 12/26/15 0321 94 %     Weight 12/26/15 0322 134 lb 2 oz (60.8 kg)     Height 12/26/15 0322 5\' 2"  (1.575 m)     Head Circumference --      Peak Flow --      Pain Score --      Pain Loc --      Pain Edu? --      Excl. in GC? --     Constitutional: Alert and oriented. Well appearing and in no acute distress. Eyes: Conjunctivae are normal. PERRL. EOMI. Head: Atraumatic. Nose: No congestion/rhinnorhea. Mouth/Throat: Mucous membranes are moist.  Oropharynx non-erythematous. Cardiovascular: Normal rate, regular rhythm. Grossly normal heart sounds.  Good peripheral circulation. Respiratory: Normal respiratory effort.  No retractions. Lungs CTAB. Gastrointestinal: Soft and nontender. No distention. Positive bowel sounds Musculoskeletal: No lower extremity tenderness nor edema.   Neurologic:  Normal speech and language. Some diminished facial sensation but otherwise cranial nerves II through XII are grossly intact with no focal motor or neuro deficits. Skin:  Skin is warm, dry and intact.  Psychiatric: Mood and affect are normal.   ____________________________________________   LABS (all labs ordered are listed, but only abnormal results are displayed)  Labs Reviewed  COMPREHENSIVE METABOLIC PANEL - Abnormal; Notable for the following:       Result Value   Chloride 97 (*)    CO2 41 (*)    Glucose, Bld 126 (*)    Calcium 10.6 (*)    Anion gap 3 (*)    All other components within  normal limits  CBC WITH DIFFERENTIAL/PLATELET - Abnormal; Notable for the following:    RDW 19.0 (*)    All other components within normal limits  URINALYSIS COMPLETEWITH MICROSCOPIC ([facility] ONLY) - Abnormal; Notable for  the following:    Color, Urine YELLOW (*)    APPearance CLOUDY (*)    Bacteria, UA RARE (*)    Squamous Epithelial / LPF 0-5 (*)    All other components within normal limits  TROPONIN I  LACTIC ACID, PLASMA  BLOOD GAS, VENOUS   ____________________________________________  EKG  ED ECG REPORT I, Rebecka Apley, the attending physician, personally viewed and interpreted this ECG.   Date: 12/26/2015  EKG Time: 323  Rate: 99  Rhythm: normal sinus rhythm  Axis: normal  Intervals:none  ST&T Change: flipped t wave in lead avl  ____________________________________________  RADIOLOGY  CT head ____________________________________________   PROCEDURES  Procedure(s) performed: None  Procedures  Critical Care performed: No  ____________________________________________   INITIAL IMPRESSION / ASSESSMENT AND PLAN / ED COURSE  Pertinent labs & imaging results that were available during my care of the patient were reviewed by me and considered in my medical decision making (see chart for details).  This is a 70 year old female who comes into the hospital today with a fall, confusion and weakness. The patient's spouse reports that this is something that is going on and off for quite some time but they're unsure exactly how to resolve these issues. The family is concerned and low the patient is being cared for currently they want to know what's going on. The patient did have a low body temperature on arrival to the emergency department. She was immediately placed on the bear hugger. I will check some blood work to include a lactic acid and I will reassess the patient once I receive some more results.  Clinical Course  Value Comment By Time  CT Head Wo  Contrast 1. No evidence of traumatic intracranial injury or fracture. 2. Scattered small vessel ischemic microangiopathy.   Rebecka Apley, MD 09/02 0542  DG Chest Portable 1 View No active cardiopulmonary disease. Rebecka Apley, MD 09/02 [phone]   The patient's CT scan and blood work at this time are unremarkable. We will check a VBG on the patient to evaluate her pH and her PCO2 status. The patient's care was signed out to Dr. Fanny Bien will follow-up the results of the VBG as well as see if the patient is able to get up and walk around. She will be dispositioned by Dr.Quale  ____________________________________________   FINAL CLINICAL IMPRESSION(S) / ED DIAGNOSES  Final diagnoses:  Confusion  Fall, initial encounter  Weakness      NEW MEDICATIONS STARTED DURING THIS VISIT:  New Prescriptions   No medications on file     Note:  This document was prepared using Dragon voice recognition software and may include unintentional dictation errors.    Rebecka Apley, MD 12/26/15 [phone]

## 2015-12-27 DIAGNOSIS — R569 Unspecified convulsions: Secondary | ICD-10-CM

## 2015-12-27 MED ORDER — SENNOSIDES-DOCUSATE SODIUM 8.6-50 MG PO TABS
2.0000 | ORAL_TABLET | Freq: Two times a day (BID) | ORAL | Status: DC
  Administered 2015-12-27 – 2015-12-28 (×3): 2 via ORAL
  Filled 2015-12-27 (×3): qty 2

## 2015-12-27 MED ORDER — CIPROFLOXACIN HCL 500 MG PO TABS: 500.0000 mg | ORAL_TABLET | Freq: Two times a day (BID) | ORAL | 0 refills | Status: DC

## 2015-12-27 MED ORDER — DEXTROSE 5 % IV SOLN
1.0000 g | INTRAVENOUS | Status: DC
  Administered 2015-12-27 – 2015-12-28 (×2): 1 g via INTRAVENOUS
  Filled 2015-12-27 (×2): qty 10

## 2015-12-27 MED ORDER — POLYETHYLENE GLYCOL 3350 17 G PO PACK
17.0000 g | PACK | Freq: Every day | ORAL | Status: DC | PRN
  Administered 2015-12-27 – 2015-12-28 (×2): 17 g via ORAL
  Filled 2015-12-27 (×2): qty 1

## 2015-12-27 MED ORDER — ENSURE ENLIVE PO LIQD: 237.0000 mL | Freq: Three times a day (TID) | ORAL | Status: DC

## 2015-12-27 MED ORDER — MUPIROCIN 2 % EX OINT
TOPICAL_OINTMENT | Freq: Two times a day (BID) | CUTANEOUS | Status: DC
  Administered 2015-12-27 – 2015-12-28 (×2): via NASAL
  Filled 2015-12-27: qty 22

## 2015-12-27 NOTE — Progress Notes (Signed)
During routine rounds, noted patient to be bleeding per nasal. Patient reported that she had this same episode 3-4 days ago and is nothing new to her. Patient advised not to strain. Cold compress applied. Bleeding later stopped. Comfort measures provided.

## 2015-12-27 NOTE — Evaluation (Signed)
Physical Therapy Evaluation Patient Details Name: Nancy BlakeSandra S York MRN: [id number] DOB: 27-Feb-1946 Today's Date: 12/27/2015   History of Present Illness  70yo female with onset of pain with breathing, fall and slurred speech was admitted, has had L hip fracture 06/24/15 and buttock abscess on L 09/22/15.  PMHx:  atherosclerosis, falls    Clinical Impression  Pt is demonstrating some difficulties with gait stability, but does not need a great deal of help.  Can expect her family to be available for her continually, and will have HHPT follow her for continued rehab.  Has equipment covered including a ramped entrance, and will follow her acutely if she remains today to increase her stability with gait and transfers.      Follow Up Recommendations Home health PT;Supervision for mobility/OOB;Supervision/Assistance - 24 hour    Equipment Recommendations  None recommended by PT    Recommendations for Other Services Rehab consult     Precautions / Restrictions Precautions Precautions: Fall (telemetry) Restrictions Weight Bearing Restrictions: No      Mobility  Bed Mobility Overal bed mobility: Needs Assistance Bed Mobility: Supine to Sit;Sit to Supine     Supine to sit: Min assist Sit to supine: Min assist   General bed mobility comments: minor cues and assist under her trunk to sit, then leg assist back to bed  Transfers Overall transfer level: Needs assistance Equipment used: Rolling walker (2 wheeled) Transfers: Sit to/from [employer]GI CorporationStand;Stand Pivot Transfers Sit to Stand: Min assist Stand pivot transfers: Min guard       General transfer comment: reminders for hand placement and pt does not follow them  Ambulation/Gait Ambulation/Gait assistance: Min assist;Min guard Ambulation Distance (Feet): 70 Feet Assistive device: Rolling walker (2 wheeled);1 person hand held assist Gait Pattern/deviations: Step-through pattern;Step-to pattern;Wide base of support;Ataxic;Decreased stride  length (mild buckling on LLE) Gait velocity: reduced Gait velocity interpretation: Below normal speed for age/gender    Stairs            Wheelchair Mobility    Modified Rankin (Stroke Patients Only)       Balance Overall balance assessment: History of Falls;Needs assistance Sitting-balance support: Feet supported Sitting balance-Leahy Scale: Fair   Postural control: Posterior lean Standing balance support: Bilateral upper extremity supported Standing balance-Leahy Scale: Poor                               Pertinent Vitals/Pain Pain Assessment: No/denies pain    Home Living Family/patient expects to be discharged to:: Private residence Living Arrangements: Spouse/significant other Available Help at Discharge: Family Type of Home: House Home Access: Ramped entrance     Home Layout: One level Home Equipment: Environmental consultantWalker - 2 wheels;Wheelchair - manual      Prior Function Level of Independence: Independent with assistive device(s)               Hand Dominance   Dominant Hand: Right    Extremity/Trunk Assessment   Upper Extremity Assessment: Overall WFL for tasks assessed           Lower Extremity Assessment: Generalized weakness      Cervical / Trunk Assessment: Normal  Communication   Communication: Expressive difficulties (speech is slurred)  Cognition Arousal/Alertness: Awake/alert Behavior During Therapy: WFL for tasks assessed/performed Overall Cognitive Status: Within Functional Limits for tasks assessed                      General Comments General  comments (skin integrity, edema, etc.): Pt reports her walker is main way to get around and has WC as well for her ramped entrance    Exercises        Assessment/Plan    PT Assessment Patient needs continued PT services  PT Diagnosis Difficulty walking   PT Problem List Decreased strength;Decreased range of motion;Decreased activity tolerance;Decreased  balance;Decreased mobility;Decreased coordination;Decreased safety awareness;Cardiopulmonary status limiting activity  PT Treatment Interventions DME instruction;Gait training;Functional mobility training;Therapeutic activities;Therapeutic exercise;Balance training;Neuromuscular re-education;Patient/family education   PT Goals (Current goals can be found in the Care Plan section) Acute Rehab PT Goals Patient Stated Goal: to go home today if MD gives permission PT Goal Formulation: With patient/family Time For Goal Achievement: 01/10/16 Potential to Achieve Goals: Good    Frequency Min 2X/week   Barriers to discharge Inaccessible home environment (has  ramp and wc)      Co-evaluation               End of Session Equipment Utilized During Treatment: Gait belt Activity Tolerance: Patient tolerated treatment well;Other (comment) (limited by LE weakness) Patient left: in bed;with call bell/phone within reach;with bed alarm set;with family/visitor present Nurse Communication: Mobility status         Time: 1610-9604 PT Time Calculation (min) (ACUTE ONLY): 30 min   Charges:   PT Evaluation $PT Eval Moderate Complexity: 1 Procedure PT Treatments $Gait Training: 8-22 mins   PT G Codes:        Nancy York January 21, 2016, 1:51 PM    Nancy York, PT MS Acute Rehab Dept. Number: [facility] [id number] and [facility] [phone]

## 2015-12-27 NOTE — Plan of Care (Signed)
Problem: Health Behavior/Discharge Planning: Goal: Ability to manage health-related needs will improve Outcome: Progressing A/o abx cont  Problem: Activity: Goal: Risk for activity intolerance will decrease Outcome: Progressing P.t. amb in hall with walker tol recomm. Home with  Home health

## 2015-12-27 NOTE — Discharge Instructions (Signed)
Resume diet and activity as before ° ° °

## 2015-12-27 NOTE — Progress Notes (Signed)
[facility] - [place] at [facility]   PATIENT NAME: Nancy York    MR#:  [id number]  DATE OF BIRTH:  [birth year]  SUBJECTIVE:  CHIEF COMPLAINT:   Chief Complaint  Patient presents with  . Fall   More awake. Afebrile.   REVIEW OF SYSTEMS:    Review of Systems  Constitutional: Positive for malaise/fatigue. Negative for chills and fever.  HENT: Negative for sore throat.   Eyes: Negative for blurred vision, double vision and pain.  Respiratory: Negative for cough, hemoptysis, shortness of breath and wheezing.   Cardiovascular: Negative for chest pain, palpitations, orthopnea and leg swelling.  Gastrointestinal: Negative for abdominal pain, constipation, diarrhea, heartburn, nausea and vomiting.  Genitourinary: Negative for dysuria and hematuria.  Musculoskeletal: Negative for back pain and joint pain.  Skin: Negative for rash.  Neurological: Negative for sensory change, speech change, focal weakness and headaches.  Endo/Heme/Allergies: Does not bruise/bleed easily.  Psychiatric/Behavioral: Negative for depression. The patient is not nervous/anxious.     DRUG ALLERGIES:   Allergies  Allergen Reactions  . Statins Other (See Comments)    Reaction:  Leg cramps   . Sulfa Antibiotics Nausea Only  . Other Rash    "leg cramps"  . Penicillins Rash and Other (See Comments)    Has patient had a PCN reaction causing immediate rash, facial/tongue/throat swelling, SOB or lightheadedness with hypotension: No Has patient had a PCN reaction causing severe rash involving mucus membranes or skin necrosis: No Has patient had a PCN reaction that required hospitalization No Has patient had a PCN reaction occurring within the last 10 years: Yes If all of the above answers are "NO", then may proceed with Cephalosporin use.  . Tape Rash    VITALS:  Blood pressure (!) 150/78, pulse 81, temperature 97.4 F (36.3 C), temperature source Oral, resp. rate 18, height 5\' 2"   (1.575 m), weight 66 kg (145 lb 9.6 oz), SpO2 100 %.  PHYSICAL EXAMINATION:   Physical Exam  GENERAL:  70 y.o.-year-old patient lying in the bed with no acute distress.  EYES: Pupils equal, round, reactive to light and accommodation. No scleral icterus. Extraocular muscles intact.  HEENT: Head atraumatic, normocephalic. Oropharynx and nasopharynx clear.  NECK:  Supple, no jugular venous distention. No thyroid enlargement, no tenderness.  LUNGS: Normal breath sounds bilaterally, no wheezing, rales, rhonchi. No use of accessory muscles of respiration.  CARDIOVASCULAR: S1, S2 normal. No murmurs, rubs, or gallops.  ABDOMEN: Soft, nontender, nondistended. Bowel sounds present. No organomegaly or mass.  EXTREMITIES: No cyanosis, clubbing or edema b/l.    NEUROLOGIC: Cranial nerves II through XII are intact. No focal Motor or sensory deficits b/l.   PSYCHIATRIC: The patient is alert and awake SKIN: No obvious rash, lesion, or ulcer.   LABORATORY PANEL:   CBC  Recent Labs Lab 12/26/15 0333  WBC 8.1  HGB 12.3  HCT 36.6  PLT 385   ------------------------------------------------------------------------------------------------------------------ Chemistries   Recent Labs Lab 12/26/15 0333  NA 141  K 4.1  CL 97*  CO2 41*  GLUCOSE 126*  BUN 20  CREATININE 0.82  CALCIUM 10.6*  AST 31  ALT 29  ALKPHOS 74  BILITOT 0.3   ------------------------------------------------------------------------------------------------------------------  Cardiac Enzymes  Recent Labs Lab 12/26/15 0333  TROPONINI <0.03   ------------------------------------------------------------------------------------------------------------------  RADIOLOGY:  Ct Head Wo Contrast  Result Date: 12/26/2015 CLINICAL DATA:  Status post fall, landing on floor. Slurred speech. Initial encounter. EXAM: CT HEAD WITHOUT CONTRAST TECHNIQUE: Contiguous axial images were obtained from  the base of the skull through the  vertex without intravenous contrast. COMPARISON:  CT of the head performed 08/31/2015 FINDINGS: Brain: There is no evidence of acute infarction, mass lesion, or intra- or extra-axial hemorrhage on CT. Scattered periventricular and subcortical white matter change likely reflects small vessel ischemic microangiopathy. The brainstem and fourth ventricle are within normal limits. The basal ganglia are unremarkable in appearance. The cerebral hemispheres demonstrate grossly normal gray-white differentiation. No mass effect or midline shift is seen. Vascular: No hyperdense vessel or unexpected calcification. Skull: There is no evidence of fracture; visualized osseous structures are unremarkable in appearance. Sinuses/Orbits: The visualized portions of the orbits are within normal limits. The paranasal sinuses and mastoid air cells are well-aerated. Other: No significant soft tissue abnormalities are seen. IMPRESSION: 1. No evidence of traumatic intracranial injury or fracture. 2. Scattered small vessel ischemic microangiopathy. Electronically Signed   By: Roanna RaiderJeffery  Chang M.D.   On: 12/26/2015 04:40   Ct Angio Chest Pe W Or Wo Contrast  Result Date: 12/26/2015 CLINICAL DATA:  Shortness of Breath. EXAM: CT ANGIOGRAPHY CHEST WITH CONTRAST TECHNIQUE: Multidetector CT imaging of the chest was performed using the standard protocol during bolus administration of intravenous contrast. Multiplanar CT image reconstructions and MIPs were obtained to evaluate the vascular anatomy. CONTRAST:  60 cc Isovue 370 COMPARISON:  Chest CT 08/21/2013 FINDINGS: Chest wall: No breast masses, supraclavicular or axillary lymphadenopathy. The thyroid gland is grossly normal. Cardiovascular: The heart is normal in size. No pericardial effusion. The aorta is normal in caliber. No dissection. Moderate atherosclerotic calcifications. Three-vessel coronary artery calcifications are noted. The pulmonary arterial tree is fairly well opacified. No  filling defects to suggest pulmonary embolism. Mediastinum/Nodes: No mediastinal or hilar mass or adenopathy. Small scattered lymph nodes are noted. The esophagus is grossly normal. Lungs/Pleura: No acute pulmonary findings. No infiltrates, edema or effusions. No interstitial lung disease or bronchiectasis. Bibasilar scarring and subpleural atelectasis. No worrisome pulmonary lesions. Upper Abdomen: No significant upper abdominal findings. Moderate atherosclerotic calcifications involving the abdominal aorta and branch vessel ostia. Musculoskeletal: No significant bony findings. Remote compression fracture noted at T12. Review of the MIP images confirms the above findings. IMPRESSION: 1. No CT findings for pulmonary embolism. 2. Atherosclerotic calcifications involving the thoracic and abdominal aortas but no focal aneurysm or dissection. Three-vessel coronary artery calcifications are also noted. 3. Basilar scarring in atelectasis but no infiltrates, effusions or edema. 4. No mediastinal or hilar mass or lymphadenopathy. Electronically Signed   By: Rudie MeyerP.  Gallerani M.D.   On: 12/26/2015 11:59   Dg Chest Portable 1 View  Result Date: 12/26/2015 CLINICAL DATA:  Pain with breathing and hypoxia EXAM: PORTABLE CHEST 1 VIEW COMPARISON:  March 31, 2007 FINDINGS: The heart size and mediastinal contours are within normal limits. There is no focal infiltrate, pulmonary edema, or pleural effusion. The visualized skeletal structures are stable. IMPRESSION: No active cardiopulmonary disease. Electronically Signed   By: Sherian ReinWei-Chen  Lin M.D.   On: 12/26/2015 07:11     ASSESSMENT AND PLAN:   * UTI Start ceftriaxone Wait for urine cx  * Acute encephalopathy and weakness due to above Resolved  * Weakness HH PT at discharge  * Seizures Continue keppra Appreciate neurology input  All the records are reviewed and case discussed with Care Management/Social Workerr. Management plans discussed with the patient, family  and they are in agreement.  CODE STATUS: FULL  DVT Prophylaxis: SCDs  TOTAL TIME TAKING CARE OF THIS PATIENT: 30 minutes.   POSSIBLE D/C  IN 1-2 DAYS, DEPENDING ON CLINICAL CONDITION.  Milagros Loll R M.D on 12/27/2015 at 11:55 PM  Between 7am to 6pm - Pager - [phone]  After 6pm go to [email or web address] - password [email or web address] [facility]  [place] [facility]  Office  [phone]  CC: Primary care physician; Marguarite Arbour, MD  Note: This dictation was prepared with Dragon dictation along with smaller phrase technology. Any transcriptional errors that result from this process are unintentional.

## 2015-12-27 NOTE — Consult Note (Signed)
Reason for Consult:history of seizures  Referring Physician: Dr Elpidio AnisSudini   CC: SOB  HPI: Nancy York is an 70 y.o. female admitted with increased confusion and disorientation. Pt has recent abcess that was drained.  Neurology consulted for history of seizures. According to family at bedside pt has ? History of seizures and pt is on Keppra.  Last time seizure like activity last Friday. Pt's Keppra has been increased to 750 BID.    Past Medical History:  Diagnosis Date  . Arthritis    joints, shoulder and knees  . Diverticulosis   . GERD (gastroesophageal reflux disease)   . Heart murmur    no doctor follows her for this  . Hypertension     Past Surgical History:  Procedure Laterality Date  . BILATERAL CARPAL TUNNEL RELEASE Bilateral   . DILATION AND CURETTAGE OF UTERUS    . FRACTURE SURGERY Left 2007   fractured left  hip  . HARDWARE REMOVAL Left 06/24/2015   Procedure: HARDWARE REMOVAL/ DHS;  Surgeon: Donato HeinzJames P Hooten, MD;  Location: [facility] ORS;  Service: Orthopedics;  Laterality: Left;  . IRRIGATION AND DEBRIDEMENT ABSCESS Right 09/24/2015   Procedure: IRRIGATION AND DEBRIDEMENT OF RIGHT HIP ABSCESS;  Surgeon: Lattie Hawichard E Cooper, MD;  Location: [facility] ORS;  Service: General;  Laterality: Right;  . JOINT REPLACEMENT Right 08/2014   total knee replacement  . KNEE ARTHROSCOPY Left   . ROTATOR CUFF REPAIR Right 2006   Dr. Gerrit Heckaliff   . TONSILLECTOMY    . TOTAL HIP ARTHROPLASTY Left 06/24/2015   Procedure: TOTAL HIP ARTHROPLASTY;  Surgeon: Donato HeinzJames P Hooten, MD;  Location: [facility] ORS;  Service: Orthopedics;  Laterality: Left;  . TOTAL KNEE ARTHROPLASTY Right 08/25/2014   Dr. Reita Chardhris Smith    Family History  Problem Relation Age of Onset  . Diabetes Father   . Breast cancer Neg Hx     Social History:  reports that she has never smoked. She has never used smokeless tobacco. She reports that she does not drink alcohol or use drugs.  Allergies  Allergen Reactions  . Statins Other (See Comments)     Reaction:  Leg cramps   . Sulfa Antibiotics Nausea Only  . Other Rash    "leg cramps"  . Penicillins Rash and Other (See Comments)    Has patient had a PCN reaction causing immediate rash, facial/tongue/throat swelling, SOB or lightheadedness with hypotension: No Has patient had a PCN reaction causing severe rash involving mucus membranes or skin necrosis: No Has patient had a PCN reaction that required hospitalization No Has patient had a PCN reaction occurring within the last 10 years: Yes If all of the above answers are "NO", then may proceed with Cephalosporin use.  . Tape Rash    Medications: I have reviewed the patient's current medications.  ROS: History obtained from the patient  General ROS: negative for - chills, fatigue, fever, night sweats, weight gain or weight loss Psychological ROS: negative for - behavioral disorder, hallucinations, memory difficulties, mood swings or suicidal ideation Ophthalmic ROS: negative for - blurry vision, double vision, eye pain or loss of vision ENT ROS: negative for - epistaxis, nasal discharge, oral lesions, sore throat, tinnitus or vertigo Allergy and Immunology ROS: negative for - hives or itchy/watery eyes Hematological and Lymphatic ROS: negative for - bleeding problems, bruising or swollen lymph nodes Endocrine ROS: negative for - galactorrhea, hair pattern changes, polydipsia/polyuria or temperature intolerance Respiratory ROS: negative for - cough, hemoptysis, shortness of breath or wheezing  Cardiovascular ROS: negative for - chest pain, dyspnea on exertion, edema or irregular heartbeat Gastrointestinal ROS: negative for - abdominal pain, diarrhea, hematemesis, nausea/vomiting or stool incontinence Genito-Urinary ROS: negative for - dysuria, hematuria, incontinence or urinary frequency/urgency Musculoskeletal ROS: negative for - joint swelling or muscular weakness Neurological ROS: as noted in HPI Dermatological ROS: negative for rash  and skin lesion changes  Physical Examination: Blood pressure (!) 144/68, pulse 85, temperature 97.4 F (36.3 C), temperature source Oral, resp. rate 20, height 5\' 2"  (1.575 m), weight 66 kg (145 lb 9.6 oz), SpO2 97 %.    Neurological Examination Mental Status: Alert, oriented, thought content appropriate.  Speech fluent without evidence of aphasia.  Able to follow 3 step commands without difficulty. Cranial Nerves: II: Discs flat bilaterally; Visual fields grossly normal, pupils equal, round, reactive to light and accommodation III,IV, VI: ptosis not present, extra-ocular motions intact bilaterally V,VII: smile symmetric, facial light touch sensation normal bilaterally VIII: hearing normal bilaterally IX,X: gag reflex present XI: bilateral shoulder shrug XII: midline tongue extension Motor: Right : Upper extremity   5/5    Left:     Upper extremity   5/5  Lower extremity   5/5     Lower extremity   5/5 Tone and bulk:normal tone throughout; no atrophy noted Sensory: Pinprick and light touch intact throughout, bilaterally Deep Tendon Reflexes: 2+ and symmetric throughout Plantars: Right: downgoing   Left: downgoing Cerebellar: normal finger-to-nose, normal rapid alternating movements and normal heel-to-shin test Gait: not tested       Laboratory Studies:   Basic Metabolic Panel:  Recent Labs Lab 12/26/15 0333  NA 141  K 4.1  CL 97*  CO2 41*  GLUCOSE 126*  BUN 20  CREATININE 0.82  CALCIUM 10.6*    Liver Function Tests:  Recent Labs Lab 12/26/15 0333  AST 31  ALT 29  ALKPHOS 74  BILITOT 0.3  PROT 7.4  ALBUMIN 3.5   No results for input(s): LIPASE, AMYLASE in the last 168 hours. No results for input(s): AMMONIA in the last 168 hours.  CBC:  Recent Labs Lab 12/26/15 0333  WBC 8.1  NEUTROABS 5.4  HGB 12.3  HCT 36.6  MCV 84.3  PLT 385    Cardiac Enzymes:  Recent Labs Lab 12/26/15 0333  TROPONINI <0.03    BNP: Invalid input(s):  POCBNP  CBG: No results for input(s): GLUCAP in the last 168 hours.  Microbiology: Results for orders placed or performed during the hospital encounter of 12/26/15  MRSA PCR Screening     Status: Abnormal   Collection Time: 12/26/15  3:30 PM  Result Value Ref Range Status   MRSA by PCR POSITIVE (A) NEGATIVE Final    Comment:        The GeneXpert MRSA Assay (FDA approved for NASAL specimens only), is one component of a comprehensive MRSA colonization surveillance program. It is not intended to diagnose MRSA infection nor to guide or monitor treatment for MRSA infections. RESULT CALLED TO, READ BACK BY AND VERIFIED WITH: DEDRA MALCOLM ON 12/26/15 AT 1639 BY [facility]     Coagulation Studies: No results for input(s): LABPROT, INR in the last 72 hours.  Urinalysis:  Recent Labs Lab 12/26/15 0618  COLORURINE YELLOW*  LABSPEC 1.016  PHURINE 6.0  GLUCOSEU NEGATIVE  HGBUR NEGATIVE  BILIRUBINUR NEGATIVE  KETONESUR NEGATIVE  PROTEINUR NEGATIVE  NITRITE NEGATIVE  LEUKOCYTESUR NEGATIVE    Lipid Panel:  No results found for: CHOL, TRIG, HDL, CHOLHDL, VLDL, LDLCALC  HgbA1C:  No results found for: HGBA1C  Urine Drug Screen:  No results found for: LABOPIA, COCAINSCRNUR, LABBENZ, AMPHETMU, THCU, LABBARB  Alcohol Level: No results for input(s): ETH in the last 168 hours.  Other results: EKG: normal EKG, normal sinus rhythm, unchanged from previous tracings.  Imaging: Ct Head Wo Contrast  Result Date: 12/26/2015 CLINICAL DATA:  Status post fall, landing on floor. Slurred speech. Initial encounter. EXAM: CT HEAD WITHOUT CONTRAST TECHNIQUE: Contiguous axial images were obtained from the base of the skull through the vertex without intravenous contrast. COMPARISON:  CT of the head performed 08/31/2015 FINDINGS: Brain: There is no evidence of acute infarction, mass lesion, or intra- or extra-axial hemorrhage on CT. Scattered periventricular and subcortical white matter change likely  reflects small vessel ischemic microangiopathy. The brainstem and fourth ventricle are within normal limits. The basal ganglia are unremarkable in appearance. The cerebral hemispheres demonstrate grossly normal gray-white differentiation. No mass effect or midline shift is seen. Vascular: No hyperdense vessel or unexpected calcification. Skull: There is no evidence of fracture; visualized osseous structures are unremarkable in appearance. Sinuses/Orbits: The visualized portions of the orbits are within normal limits. The paranasal sinuses and mastoid air cells are well-aerated. Other: No significant soft tissue abnormalities are seen. IMPRESSION: 1. No evidence of traumatic intracranial injury or fracture. 2. Scattered small vessel ischemic microangiopathy. Electronically Signed   By: Roanna Raider M.D.   On: 12/26/2015 04:40   Ct Angio Chest Pe W Or Wo Contrast  Result Date: 12/26/2015 CLINICAL DATA:  Shortness of Breath. EXAM: CT ANGIOGRAPHY CHEST WITH CONTRAST TECHNIQUE: Multidetector CT imaging of the chest was performed using the standard protocol during bolus administration of intravenous contrast. Multiplanar CT image reconstructions and MIPs were obtained to evaluate the vascular anatomy. CONTRAST:  60 cc Isovue 370 COMPARISON:  Chest CT 08/21/2013 FINDINGS: Chest wall: No breast masses, supraclavicular or axillary lymphadenopathy. The thyroid gland is grossly normal. Cardiovascular: The heart is normal in size. No pericardial effusion. The aorta is normal in caliber. No dissection. Moderate atherosclerotic calcifications. Three-vessel coronary artery calcifications are noted. The pulmonary arterial tree is fairly well opacified. No filling defects to suggest pulmonary embolism. Mediastinum/Nodes: No mediastinal or hilar mass or adenopathy. Small scattered lymph nodes are noted. The esophagus is grossly normal. Lungs/Pleura: No acute pulmonary findings. No infiltrates, edema or effusions. No interstitial  lung disease or bronchiectasis. Bibasilar scarring and subpleural atelectasis. No worrisome pulmonary lesions. Upper Abdomen: No significant upper abdominal findings. Moderate atherosclerotic calcifications involving the abdominal aorta and branch vessel ostia. Musculoskeletal: No significant bony findings. Remote compression fracture noted at T12. Review of the MIP images confirms the above findings. IMPRESSION: 1. No CT findings for pulmonary embolism. 2. Atherosclerotic calcifications involving the thoracic and abdominal aortas but no focal aneurysm or dissection. Three-vessel coronary artery calcifications are also noted. 3. Basilar scarring in atelectasis but no infiltrates, effusions or edema. 4. No mediastinal or hilar mass or lymphadenopathy. Electronically Signed   By: Rudie Meyer M.D.   On: 12/26/2015 11:59   Dg Chest Portable 1 View  Result Date: 12/26/2015 CLINICAL DATA:  Pain with breathing and hypoxia EXAM: PORTABLE CHEST 1 VIEW COMPARISON:  March 31, 2007 FINDINGS: The heart size and mediastinal contours are within normal limits. There is no focal infiltrate, pulmonary edema, or pleural effusion. The visualized skeletal structures are stable. IMPRESSION: No active cardiopulmonary disease. Electronically Signed   By: Sherian Rein M.D.   On: 12/26/2015 07:11     Assessment/Plan:  70 y.o. female  admitted with increased confusion and disorientation. Pt has recent abcess that was drained.  Neurology consulted for history of seizures. According to family at bedside pt has ? History of seizures and pt is on Keppra.  Last time seizure like activity last Friday. Pt's Keppra has been increased to 750 BID.    - Currently appears to be at baseline   - Pt does have appointment with Dr. Sherryll Burger and [facility] in the upcoming weeks - From neurological perspective no need for any further imaging - Cont home dose Keppra - d/c planning as per primary team.  12/27/2015, 1:46 PM

## 2015-12-28 LAB — URINE CULTURE
CULTURE: NO GROWTH
SPECIAL REQUESTS: NORMAL

## 2015-12-28 MED ORDER — POLYETHYLENE GLYCOL 3350 17 G PO PACK: 17.0000 g | PACK | Freq: Every day | ORAL | 0 refills | Status: DC | PRN

## 2015-12-28 NOTE — Progress Notes (Signed)
Pt  Improved.  For discharge home. Alert no resp distress.  Instructions discussed with pt and spouse.   meds discussed .  presc given and discussed. Diet / activity and f/u discussed.  Dietary in earlier this am  And spoke with  Pt and husband re  Diet.   Will f/u  With  Dr sparks  On tomorrow. Home via  W/c at this time.

## 2015-12-28 NOTE — Progress Notes (Signed)
Initial Nutrition Assessment  DOCUMENTATION CODES:   Severe malnutrition in context of chronic illness  INTERVENTION:  -Recommend regular diet to allow pt option to order liquids or whatever types of foods she feels she can eat. Asking for bacon this am.   -Recommend for pt husband to continue to bring in boost for pt as she does not like ensure products.   -Discussed high calorie, high protein foods with pt and husband. Husband verbalized understanding.    NUTRITION DIAGNOSIS:   Malnutrition related to chronic illness as evidenced by percent weight loss, energy intake < or equal to 75% for > or equal to 1 month.    GOAL:   Patient will meet greater than or equal to 90% of their needs    MONITOR:   PO intake, Supplement acceptance, Weight trends  REASON FOR ASSESSMENT:   Consult Poor PO  ASSESSMENT:      Pt admitted with UTI, acute encephalopathy, weakness.  Noted pt with hip fracture in March 2017, abscess drained from buttock 09/22/2015, oral thrush for the past week.  Pt with history of seizures and [facility]    Past Medical History:  Diagnosis Date  . Arthritis    joints, shoulder and knees  . Diverticulosis   . GERD (gastroesophageal reflux disease)   . Heart murmur    no doctor follows her for this  . Hypertension    Husband at bedside and reports poor po intake since March 2017. Husband reports pt has been mainly drinking liquids, eating soft foods, drinking boost, gatorade since March due to fear of nausea, vomiting.  Husband reports that pt has had episodes of thrush which has effected appetite as well and reports pt has episodes of vertigo that makes her throw up and then she does not want to eat.  [facility] with no cause really found other than thinks hyponatremia was corrected to quickly per husband. Pt was to see GI tomorrow for evaluation.    Pt asking for bacon this am.  Explained that current diet order will not  allow bacon.  Medications reviewed: nystatin, KCL, vit b 12, vit D  Labs reviewed: glucose 126  Nutrition-Focused physical exam completed. Findings are no fat depletion, mild in thigh and patellar region muscle depletion, and no edema.    Diet Order:  Diet full liquid Room service appropriate? Yes; Fluid consistency: Thin  Skin:  Reviewed, no issues  Last BM:  9/3  Height:   Ht Readings from Last 1 Encounters:  12/26/15 5\' 2"  (1.575 m)    Weight: Husband reports wt in 180s in March with wt down to 145 pounds this admission.  (21% wt loss in the last 5 months)  Wt Readings from Last 1 Encounters:  12/26/15 145 lb 9.6 oz (66 kg)    Ideal Body Weight:     BMI:  Body mass index is 26.63 kg/m.  Estimated Nutritional Needs:   Kcal:  1650-1980 kcals/d  Protein:  79-99 g/d  Fluid:  >/=16600ml/d  EDUCATION NEEDS:   Education needs addressed  Princessa Lesmeister B. Freida BusmanAllen, RD, LDN [phone] (pager) Weekend/On-Call pager [phone])

## 2015-12-28 NOTE — Care Management (Signed)
Admitted to [facility] with the diagnosis of hypoxia. Lives with husband, Thomasenia SalesFreddy x 29 years [phone]). Last seen Dr. Judithann SheenSparks 2 weeks ago. Followed by [facility] until 2 weeks ago. [facility] last May. No home oxygen. Prescriptions are filled at [facility].  Wheelchairs x 2, rolling walker, bedside commode, and ramps in the home. Needs help with dressing and baths, self feeds. Weight loss of 30 lbs since March. Multiple falls in the past. Physical therapy evaluation completed. Recommends home with home health and therapy. Would like to have [facility] again. Melissa, [facility] representative updated. Gwenette GreetBrenda S Harim Bi RN MSN CCM Care Management [phone]

## 2015-12-28 NOTE — Care Management Important Message (Signed)
Important Message  Patient Details  Name: Nancy BlakeSandra S York MRN: [id number] Date of Birth: 1945/08/19   Medicare Important Message Given:  Yes    Gwenette GreetBrenda S Chalmer Zheng, RN 12/28/2015, 11:17 AM

## 2015-12-30 LAB — BLOOD GAS, ARTERIAL
ACID-BASE EXCESS: 13.3 mmol/L — AB (ref 0.0–2.0)
BICARBONATE: 40.2 mmol/L — AB (ref 20.0–28.0)
FIO2: 0.21
O2 Saturation: 83.4 %
PCO2 ART: 62 mmHg — AB (ref 32.0–48.0)
PO2 ART: 47 mmHg — AB (ref 83.0–108.0)
Patient temperature: 37
pH, Arterial: 7.42 (ref 7.350–7.450)

## 2015-12-31 ENCOUNTER — Other Ambulatory Visit: Payer: Self-pay | Admitting: Internal Medicine

## 2015-12-31 DIAGNOSIS — G40309 Generalized idiopathic epilepsy and epileptic syndromes, not intractable, without status epilepticus: Secondary | ICD-10-CM

## 2015-12-31 DIAGNOSIS — T68XXXA Hypothermia, initial encounter: Secondary | ICD-10-CM

## 2016-01-03 NOTE — Discharge Summary (Signed)
[facility] [place] at [facility]   PATIENT NAME: Nancy York    MR#:  [id number]  DATE OF BIRTH:  05-03-1945  DATE OF ADMISSION:  12/26/2015 ADMITTING PHYSICIAN: Gracelyn Nurse, MD  DATE OF DISCHARGE: 12/28/2015  2:16 PM  PRIMARY CARE PHYSICIAN: SPARKS,JEFFREY D, MD   ADMISSION DIAGNOSIS:  Confusion [R41.0] Weakness [R53.1] Hypoxia [R09.02] Hypothermia, initial encounter [T68.XXXA] Fall, initial encounter [id number]]  DISCHARGE DIAGNOSIS:  Active Problems:   Hypoxia   Protein-calorie malnutrition, severe   SECONDARY DIAGNOSIS:   Past Medical History:  Diagnosis Date  . Arthritis    joints, shoulder and knees  . Diverticulosis   . GERD (gastroesophageal reflux disease)   . Heart murmur    no doctor follows her for this  . Hypertension      ADMITTING HISTORY  Chief Complaint: Weakness HPI: Found acting funny last night. Very weak. Was not acute, this has been worsening over past few days. She has had hip fx on 3/1 and an abces drained from her buttocks 5/30. She was recently started on Keppra and dose increase for possible seizure disorder. No report of seizures since. Poor PO intake of recent and was dx with oral thrush about 1 week ago. Found to be hypoxic in ED and mildly hypothermic.  HOSPITAL COURSE:   * UTI Start ceftriaxone In the hospital through IV. Change to oral ciprofloxacin at discharge.  * Acute encephalopathy and weakness due to above Resolved  * Weakness HH PT at discharge  * Seizures Continue keppra Appreciate neurology input No change in medications. Seen by Dr. Loretha Brasil of neurology.  Stable for discharge home with home health physical therapy.  CONSULTS OBTAINED:  Treatment Team:  Kym Groom, MD Pauletta Browns, MD  DRUG ALLERGIES:   Allergies  Allergen Reactions  . Statins Other (See Comments)    Reaction:  Leg cramps   . Sulfa Antibiotics Nausea Only  . Other Rash    "leg cramps"  .  Penicillins Rash and Other (See Comments)    Has patient had a PCN reaction causing immediate rash, facial/tongue/throat swelling, SOB or lightheadedness with hypotension: No Has patient had a PCN reaction causing severe rash involving mucus membranes or skin necrosis: No Has patient had a PCN reaction that required hospitalization No Has patient had a PCN reaction occurring within the last 10 years: Yes If all of the above answers are "NO", then may proceed with Cephalosporin use.  . Tape Rash    DISCHARGE MEDICATIONS:   Discharge Medication List as of 12/28/2015  1:11 PM    START taking these medications   Details  ciprofloxacin (CIPRO) 500 MG tablet Take 1 tablet (500 mg total) by mouth 2 (two) times daily., Starting Sun 12/27/2015, Print    polyethylene glycol (MIRALAX / GLYCOLAX) packet Take 17 g by mouth daily as needed for mild constipation., Starting Mon 12/28/2015, Normal      CONTINUE these medications which have NOT CHANGED   Details  amLODipine (NORVASC) 10 MG tablet Take 5 mg by mouth daily. Take at 12 noon., Historical Med    aspirin EC 81 MG tablet Take 81 mg by mouth every morning. , Historical Med    cetirizine (ZYRTEC) 10 MG tablet Take 10 mg by mouth daily as needed for allergies., Historical Med    dicyclomine (BENTYL) 20 MG tablet Take 40 mg by mouth 4 (four) times daily as needed for spasms. , Historical Med    diphenhydrAMINE (BENADRYL) 25 mg capsule  Take 1 capsule (25 mg total) by mouth every 8 (eight) hours as needed for itching or allergies., Starting Tue 07/21/2015, Normal    gentamicin ointment (GARAMYCIN) 0.1 % Apply 1 application topically daily as needed. For dermatitis., Starting Tue 11/10/2015, Historical Med    levETIRAcetam (KEPPRA) 750 MG tablet Take 750 mg by mouth 2 (two) times daily., Starting Tue 12/01/2015, Historical Med    LORazepam (ATIVAN) 0.5 MG tablet Take 1 tablet (0.5 mg total) by mouth daily as needed for anxiety., Starting Sat 09/26/2015,  Print    meclizine (ANTIVERT) 25 MG tablet Take 25 mg by mouth 3 (three) times daily as needed for dizziness., Historical Med    nystatin (MYCOSTATIN) 100000 UNIT/ML suspension Take 5 mLs by mouth 4 (four) times daily., Starting Tue 12/22/2015, Historical Med    nystatin cream (MYCOSTATIN) Apply 1 application topically daily as needed for dry skin. , Historical Med    ondansetron (ZOFRAN-ODT) 8 MG disintegrating tablet Take 8 mg by mouth every 8 (eight) hours as needed for nausea or vomiting. Reported on 09/29/2015, Historical Med    potassium chloride SA (K-DUR,KLOR-CON) 20 MEQ tablet Take 20 mEq by mouth at bedtime., Starting Wed 12/02/2015, Historical Med    RABEprazole (ACIPHEX) 20 MG tablet Take 20 mg by mouth 2 (two) times daily., Historical Med    ranitidine (ZANTAC) 150 MG tablet Take 150 mg by mouth 2 (two) times daily as needed for heartburn. , Historical Med    vitamin B-12 (CYANOCOBALAMIN) 1000 MCG tablet Take 1,000 mcg by mouth every morning., Historical Med    Vitamin D, Ergocalciferol, (DRISDOL) 50000 units CAPS capsule Take 50,000 Units by mouth every 7 (seven) days. Pt takes on Sunday., Historical Med    acetaminophen (TYLENOL) 325 MG tablet Take 1 tablet (325 mg total) by mouth every 6 (six) hours as needed for mild pain (or Fever >/= 101)., Starting Tue 07/21/2015, OTC    acetic acid-hydrocortisone (VOSOL-HC) otic solution Place 3 drops into the left ear 3 (three) times daily., Starting Mon 11/02/2015, Print        Today   VITAL SIGNS:  Blood pressure (!) 127/47, pulse 79, temperature 97.4 F (36.3 C), temperature source Oral, resp. rate 20, height 5\' 2"  (1.575 m), weight 66 kg (145 lb 9.6 oz), SpO2 99 %.  I/O:  No intake or output data in the 24 hours ending 01/03/16 1206  PHYSICAL EXAMINATION:  Physical Exam  GENERAL:  70 y.o.-year-old patient lying in the bed with no acute distress.  LUNGS: Normal breath sounds bilaterally, no wheezing, rales,rhonchi or  crepitation. No use of accessory muscles of respiration.  CARDIOVASCULAR: S1, S2 normal. No murmurs, rubs, or gallops.  ABDOMEN: Soft, non-tender, non-distended. Bowel sounds present. No organomegaly or mass.  NEUROLOGIC: Moves all 4 extremities. SKIN: No obvious rash, lesion, or ulcer.   DATA REVIEW:   CBC No results for input(s): WBC, HGB, HCT, PLT in the last 168 hours.  Chemistries  No results for input(s): NA, K, CL, CO2, GLUCOSE, BUN, CREATININE, CALCIUM, MG, AST, ALT, ALKPHOS, BILITOT in the last 168 hours.  Invalid input(s): GFRCGP  Cardiac Enzymes No results for input(s): TROPONINI in the last 168 hours.  Microbiology Results  Results for orders placed or performed during the hospital encounter of 12/26/15  MRSA PCR Screening     Status: Abnormal   Collection Time: 12/26/15  3:30 PM  Result Value Ref Range Status   MRSA by PCR POSITIVE (A) NEGATIVE Final    Comment:  The GeneXpert MRSA Assay (FDA approved for NASAL specimens only), is one component of a comprehensive MRSA colonization surveillance program. It is not intended to diagnose MRSA infection nor to guide or monitor treatment for MRSA infections. RESULT CALLED TO, READ BACK BY AND VERIFIED WITH: DEDRA MALCOLM ON 12/26/15 AT 1639 BY [facility]   Urine culture     Status: None   Collection Time: 12/27/15  6:18 AM  Result Value Ref Range Status   Specimen Description URINE, RANDOM  Final   Special Requests Normal  Final   Culture NO GROWTH Performed at [facility]   Final   Report Status 12/28/2015 FINAL  Final    RADIOLOGY:  No results found.  Follow up with PCP in 1 week.  Management plans discussed with the patient, family and they are in agreement.  CODE STATUS:  Code Status History    Date Active Date Inactive Code Status Order ID Comments User Context   12/26/2015  2:21 PM 12/28/2015  5:30 PM Full Code [id number]  Gracelyn Nurse, MD Inpatient   09/22/2015  8:13 PM 09/26/2015  7:45 PM Full  Code [id number]  Adrian Saran, MD Inpatient   07/18/2015 10:41 PM 07/21/2015  7:44 PM Full Code [id number]  Oralia Manis, MD Inpatient   08/25/2014  7:10 PM 08/28/2014  3:08 PM Full Code [id number]  Donato Heinz, MD Inpatient   08/25/2014  7:10 PM 08/25/2014  7:10 PM Full Code [id number]  Donato Heinz, MD Inpatient    Advance Directive Documentation   Flowsheet Row Most Recent Value  Type of Advance Directive  Healthcare Power of Attorney  Pre-existing out of facility DNR order (yellow form or pink MOST form)  No data  "MOST" Form in Place?  No data      TOTAL TIME TAKING CARE OF THIS PATIENT ON DAY OF DISCHARGE: more than 30 minutes.   Milagros Loll R M.D on 01/03/2016 at 12:06 PM  Between 7am to 6pm - Pager - [phone]  After 6pm go to [email or web address] - password [email or web address] [facility]  [place] [facility]  Office  [phone]  CC: Primary care physician; Marguarite Arbour, MD  Note: This dictation was prepared with Dragon dictation along with smaller phrase technology. Any transcriptional errors that result from this process are unintentional.

## 2016-01-08 ENCOUNTER — Ambulatory Visit: Payer: Commercial Managed Care - HMO

## 2016-01-29 ENCOUNTER — Inpatient Hospital Stay: Payer: Commercial Managed Care - HMO

## 2016-01-29 ENCOUNTER — Encounter: Payer: Self-pay | Admitting: Radiology

## 2016-01-29 ENCOUNTER — Inpatient Hospital Stay
Admission: EM | Admit: 2016-01-29 | Discharge: 2016-02-02 | DRG: 871 | Disposition: A | Discharge status: Other Acute Inpatient Hospital | Payer: Commercial Managed Care - HMO | Attending: Specialist | Admitting: Specialist

## 2016-01-29 ENCOUNTER — Emergency Department: Payer: Commercial Managed Care - HMO

## 2016-01-29 DIAGNOSIS — N390 Urinary tract infection, site not specified: Secondary | ICD-10-CM

## 2016-01-29 DIAGNOSIS — R627 Adult failure to thrive: Secondary | ICD-10-CM | POA: Diagnosis present

## 2016-01-29 DIAGNOSIS — E871 Hypo-osmolality and hyponatremia: Secondary | ICD-10-CM | POA: Diagnosis present

## 2016-01-29 DIAGNOSIS — Z23 Encounter for immunization: Secondary | ICD-10-CM

## 2016-01-29 DIAGNOSIS — I10 Essential (primary) hypertension: Secondary | ICD-10-CM | POA: Diagnosis present

## 2016-01-29 DIAGNOSIS — E43 Unspecified severe protein-calorie malnutrition: Secondary | ICD-10-CM | POA: Diagnosis present

## 2016-01-29 DIAGNOSIS — E785 Hyperlipidemia, unspecified: Secondary | ICD-10-CM | POA: Diagnosis present

## 2016-01-29 DIAGNOSIS — R262 Difficulty in walking, not elsewhere classified: Secondary | ICD-10-CM | POA: Diagnosis present

## 2016-01-29 DIAGNOSIS — R531 Weakness: Secondary | ICD-10-CM

## 2016-01-29 DIAGNOSIS — R1310 Dysphagia, unspecified: Secondary | ICD-10-CM | POA: Diagnosis present

## 2016-01-29 DIAGNOSIS — E538 Deficiency of other specified B group vitamins: Secondary | ICD-10-CM | POA: Diagnosis present

## 2016-01-29 DIAGNOSIS — M6281 Muscle weakness (generalized): Secondary | ICD-10-CM | POA: Diagnosis not present

## 2016-01-29 DIAGNOSIS — M48061 Spinal stenosis, lumbar region without neurogenic claudication: Secondary | ICD-10-CM | POA: Diagnosis present

## 2016-01-29 DIAGNOSIS — M545 Low back pain, unspecified: Secondary | ICD-10-CM

## 2016-01-29 DIAGNOSIS — A419 Sepsis, unspecified organism: Secondary | ICD-10-CM | POA: Diagnosis not present

## 2016-01-29 DIAGNOSIS — Z7982 Long term (current) use of aspirin: Secondary | ICD-10-CM

## 2016-01-29 DIAGNOSIS — R0902 Hypoxemia: Secondary | ICD-10-CM | POA: Diagnosis present

## 2016-01-29 DIAGNOSIS — Z79899 Other long term (current) drug therapy: Secondary | ICD-10-CM | POA: Diagnosis not present

## 2016-01-29 DIAGNOSIS — F0631 Mood disorder due to known physiological condition with depressive features: Secondary | ICD-10-CM | POA: Diagnosis not present

## 2016-01-29 DIAGNOSIS — Z880 Allergy status to penicillin: Secondary | ICD-10-CM

## 2016-01-29 DIAGNOSIS — F450 Somatization disorder: Secondary | ICD-10-CM

## 2016-01-29 DIAGNOSIS — M4804 Spinal stenosis, thoracic region: Secondary | ICD-10-CM | POA: Diagnosis present

## 2016-01-29 DIAGNOSIS — R0689 Other abnormalities of breathing: Secondary | ICD-10-CM

## 2016-01-29 DIAGNOSIS — R29898 Other symptoms and signs involving the musculoskeletal system: Secondary | ICD-10-CM

## 2016-01-29 DIAGNOSIS — R680 Hypothermia, not associated with low environmental temperature: Secondary | ICD-10-CM | POA: Diagnosis present

## 2016-01-29 DIAGNOSIS — Z91048 Other nonmedicinal substance allergy status: Secondary | ICD-10-CM

## 2016-01-29 DIAGNOSIS — T68XXXA Hypothermia, initial encounter: Secondary | ICD-10-CM

## 2016-01-29 DIAGNOSIS — R2681 Unsteadiness on feet: Secondary | ICD-10-CM

## 2016-01-29 DIAGNOSIS — Z96651 Presence of right artificial knee joint: Secondary | ICD-10-CM | POA: Diagnosis present

## 2016-01-29 DIAGNOSIS — Z881 Allergy status to other antibiotic agents status: Secondary | ICD-10-CM

## 2016-01-29 DIAGNOSIS — R200 Anesthesia of skin: Secondary | ICD-10-CM | POA: Diagnosis present

## 2016-01-29 DIAGNOSIS — Z888 Allergy status to other drugs, medicaments and biological substances status: Secondary | ICD-10-CM

## 2016-01-29 DIAGNOSIS — J9601 Acute respiratory failure with hypoxia: Secondary | ICD-10-CM

## 2016-01-29 DIAGNOSIS — R209 Unspecified disturbances of skin sensation: Secondary | ICD-10-CM

## 2016-01-29 DIAGNOSIS — K219 Gastro-esophageal reflux disease without esophagitis: Secondary | ICD-10-CM | POA: Diagnosis present

## 2016-01-29 DIAGNOSIS — Z96642 Presence of left artificial hip joint: Secondary | ICD-10-CM | POA: Diagnosis present

## 2016-01-29 DIAGNOSIS — Z6821 Body mass index (BMI) 21.0-21.9, adult: Secondary | ICD-10-CM | POA: Diagnosis not present

## 2016-01-29 DIAGNOSIS — Z22322 Carrier or suspected carrier of Methicillin resistant Staphylococcus aureus: Secondary | ICD-10-CM

## 2016-01-29 LAB — URINALYSIS COMPLETE WITH MICROSCOPIC (ARMC ONLY)
Bilirubin Urine: NEGATIVE
Glucose, UA: NEGATIVE mg/dL
KETONES UR: NEGATIVE mg/dL
NITRITE: POSITIVE — AB
PH: 6 (ref 5.0–8.0)
PROTEIN: 30 mg/dL — AB
SPECIFIC GRAVITY, URINE: 1.014 (ref 1.005–1.030)
Squamous Epithelial / LPF: NONE SEEN

## 2016-01-29 LAB — TROPONIN I
Troponin I: <0.03 ng/mL (ref <0.03)
Troponin I: <0.03 ng/mL (ref <0.03)

## 2016-01-29 LAB — DIFFERENTIAL
BASOS ABS: 0.1 10*3/uL (ref 0–0.1)
Basophils Relative: 1 %
EOS ABS: 0.3 10*3/uL (ref 0–0.7)
EOS PCT: 3 %
LYMPHS PCT: 37 %
Lymphs Abs: 3.8 10*3/uL — ABNORMAL HIGH (ref 1.0–3.6)
Monocytes Absolute: 0.6 10*3/uL (ref 0.2–0.9)
Monocytes Relative: 6 %
NEUTROS PCT: 53 %
Neutro Abs: 5.5 10*3/uL (ref 1.4–6.5)

## 2016-01-29 LAB — COMPREHENSIVE METABOLIC PANEL
ALBUMIN: 3.3 g/dL — AB (ref 3.5–5.0)
ALK PHOS: 108 U/L (ref 38–126)
ALT: 19 U/L (ref 14–54)
ANION GAP: 8 (ref 5–15)
AST: 24 U/L (ref 15–41)
BUN: 11 mg/dL (ref 6–20)
CO2: 27 mmol/L (ref 22–32)
Calcium: 9.8 mg/dL (ref 8.9–10.3)
Chloride: 100 mmol/L — ABNORMAL LOW (ref 101–111)
Creatinine, Ser: 0.69 mg/dL (ref 0.44–1.00)
GFR calc Af Amer: >60 mL/min (ref >60)
GFR calc non Af Amer: >60 mL/min (ref >60)
GLUCOSE: 102 mg/dL — AB (ref 65–99)
POTASSIUM: 4.2 mmol/L (ref 3.5–5.1)
SODIUM: 135 mmol/L (ref 135–145)
Total Bilirubin: 0.4 mg/dL (ref 0.3–1.2)
Total Protein: 7.2 g/dL (ref 6.5–8.1)

## 2016-01-29 LAB — CBC
HCT: 37.7 % (ref 35.0–47.0)
HEMOGLOBIN: 13 g/dL (ref 12.0–16.0)
MCH: 29.8 pg (ref 26.0–34.0)
MCHC: 34.6 g/dL (ref 32.0–36.0)
MCV: 86.2 fL (ref 80.0–100.0)
PLATELETS: 490 10*3/uL — AB (ref 150–440)
RBC: 4.38 MIL/uL (ref 3.80–5.20)
RDW: 16.7 % — ABNORMAL HIGH (ref 11.5–14.5)
WBC: 10.3 10*3/uL (ref 3.6–11.0)

## 2016-01-29 LAB — TSH: TSH: 1.234 u[IU]/mL (ref 0.350–4.500)

## 2016-01-29 LAB — LACTIC ACID, PLASMA: Lactic Acid, Venous: 0.9 mmol/L (ref 0.5–1.9)

## 2016-01-29 LAB — APTT: APTT: 39 s — AB (ref 24–36)

## 2016-01-29 LAB — MRSA PCR SCREENING: MRSA by PCR: POSITIVE — AB

## 2016-01-29 LAB — PROTIME-INR
INR: 0.97
PROTHROMBIN TIME: 12.9 s (ref 11.4–15.2)

## 2016-01-29 LAB — ETHANOL: Alcohol, Ethyl (B): <5 mg/dL (ref <5)

## 2016-01-29 MED ORDER — PANTOPRAZOLE SODIUM 40 MG PO TBEC
40.0000 mg | DELAYED_RELEASE_TABLET | Freq: Every day | ORAL | Status: DC
  Administered 2016-01-30 – 2016-02-02 (×4): 40 mg via ORAL
  Filled 2016-01-29 (×5): qty 1

## 2016-01-29 MED ORDER — POTASSIUM CHLORIDE CRYS ER 20 MEQ PO TBCR
20.0000 meq | EXTENDED_RELEASE_TABLET | Freq: Every day | ORAL | Status: DC
  Administered 2016-01-29 – 2016-02-01 (×4): 20 meq via ORAL
  Filled 2016-01-29 (×4): qty 1

## 2016-01-29 MED ORDER — CHLORHEXIDINE GLUCONATE CLOTH 2 % EX PADS
6.0000 | MEDICATED_PAD | Freq: Every day | CUTANEOUS | Status: DC
  Administered 2016-01-30 – 2016-02-02 (×4): 6 via TOPICAL

## 2016-01-29 MED ORDER — ONDANSETRON 8 MG PO TBDP: 8.0000 mg | ORAL_TABLET | Freq: Three times a day (TID) | ORAL | Status: DC | PRN

## 2016-01-29 MED ORDER — DOCUSATE SODIUM 100 MG PO CAPS
100.0000 mg | ORAL_CAPSULE | Freq: Two times a day (BID) | ORAL | Status: DC
  Administered 2016-01-29 – 2016-02-02 (×8): 100 mg via ORAL
  Filled 2016-01-29 (×8): qty 1

## 2016-01-29 MED ORDER — ONDANSETRON HCL 4 MG PO TABS: 4.0000 mg | ORAL_TABLET | Freq: Four times a day (QID) | ORAL | Status: DC | PRN

## 2016-01-29 MED ORDER — ENOXAPARIN SODIUM 40 MG/0.4ML ~~LOC~~ SOLN
40.0000 mg | SUBCUTANEOUS | Status: DC
  Administered 2016-01-29 – 2016-02-01 (×4): 40 mg via SUBCUTANEOUS
  Filled 2016-01-29 (×4): qty 0.4

## 2016-01-29 MED ORDER — LORAZEPAM 0.5 MG PO TABS: 0.2500 mg | ORAL_TABLET | Freq: Every day | ORAL | Status: DC | PRN

## 2016-01-29 MED ORDER — SODIUM CHLORIDE 0.9 % IV SOLN
INTRAVENOUS | Status: AC
  Administered 2016-01-29 – 2016-01-30 (×3): via INTRAVENOUS

## 2016-01-29 MED ORDER — LEVOFLOXACIN IN D5W 500 MG/100ML IV SOLN
500.0000 mg | INTRAVENOUS | Status: DC
  Administered 2016-01-29: 500 mg via INTRAVENOUS
  Filled 2016-01-29: qty 100

## 2016-01-29 MED ORDER — FAMOTIDINE 20 MG PO TABS
20.0000 mg | ORAL_TABLET | Freq: Every day | ORAL | Status: DC
  Administered 2016-01-30 – 2016-02-01 (×3): 20 mg via ORAL
  Filled 2016-01-29 (×4): qty 1

## 2016-01-29 MED ORDER — LORATADINE 10 MG PO TABS
10.0000 mg | ORAL_TABLET | Freq: Every day | ORAL | Status: DC
  Administered 2016-01-30: 10 mg via ORAL
  Filled 2016-01-29 (×2): qty 1

## 2016-01-29 MED ORDER — ACETAMINOPHEN 325 MG PO TABS
650.0000 mg | ORAL_TABLET | Freq: Four times a day (QID) | ORAL | Status: DC | PRN
  Administered 2016-01-30: 650 mg via ORAL
  Filled 2016-01-29: qty 2

## 2016-01-29 MED ORDER — ASPIRIN EC 81 MG PO TBEC
81.0000 mg | DELAYED_RELEASE_TABLET | ORAL | Status: DC
  Administered 2016-01-30 – 2016-02-02 (×3): 81 mg via ORAL
  Filled 2016-01-29 (×4): qty 1

## 2016-01-29 MED ORDER — ACETAMINOPHEN 325 MG PO TABS
650.0000 mg | ORAL_TABLET | Freq: Once | ORAL | Status: AC
  Administered 2016-01-29: 650 mg via ORAL
  Filled 2016-01-29: qty 2

## 2016-01-29 MED ORDER — HYDROCORTISONE-ACETIC ACID 1-2 % OT SOLN
3.0000 [drp] | Freq: Three times a day (TID) | OTIC | Status: DC
  Administered 2016-01-30 – 2016-02-02 (×7): 3 [drp] via OTIC
  Filled 2016-01-29: qty 10

## 2016-01-29 MED ORDER — NYSTATIN 100000 UNIT/GM EX CREA
1.0000 "application " | TOPICAL_CREAM | Freq: Two times a day (BID) | CUTANEOUS | Status: DC | PRN
  Administered 2016-01-31: 1 via TOPICAL
  Filled 2016-01-29: qty 15

## 2016-01-29 MED ORDER — DIPHENHYDRAMINE HCL 25 MG PO CAPS: 25.0000 mg | ORAL_CAPSULE | Freq: Three times a day (TID) | ORAL | Status: DC | PRN

## 2016-01-29 MED ORDER — SODIUM CHLORIDE 0.9 % IV BOLUS (SEPSIS)
1000.0000 mL | Freq: Once | INTRAVENOUS | Status: AC
  Administered 2016-01-29: 1000 mL via INTRAVENOUS

## 2016-01-29 MED ORDER — VITAMIN D (ERGOCALCIFEROL) 1.25 MG (50000 UNIT) PO CAPS: 50000.0000 [IU] | ORAL_CAPSULE | ORAL | Status: DC

## 2016-01-29 MED ORDER — MUPIROCIN 2 % EX OINT
1.0000 "application " | TOPICAL_OINTMENT | Freq: Two times a day (BID) | CUTANEOUS | Status: DC
  Administered 2016-01-30 – 2016-02-02 (×8): 1 via NASAL
  Filled 2016-01-29 (×2): qty 22

## 2016-01-29 MED ORDER — NYSTATIN 100000 UNIT/ML MT SUSP
5.0000 mL | Freq: Four times a day (QID) | OROMUCOSAL | Status: DC
  Administered 2016-01-30 – 2016-02-02 (×13): 500000 [IU] via ORAL
  Filled 2016-01-29 (×14): qty 5

## 2016-01-29 MED ORDER — VITAMIN B-12 1000 MCG PO TABS
1000.0000 ug | ORAL_TABLET | ORAL | Status: DC
  Administered 2016-01-31 – 2016-02-02 (×3): 1000 ug via ORAL
  Filled 2016-01-29 (×5): qty 1

## 2016-01-29 MED ORDER — AMLODIPINE BESYLATE 5 MG PO TABS
5.0000 mg | ORAL_TABLET | Freq: Every day | ORAL | Status: DC
  Administered 2016-01-30 – 2016-02-02 (×4): 5 mg via ORAL
  Filled 2016-01-29 (×5): qty 1

## 2016-01-29 MED ORDER — MIRTAZAPINE 15 MG PO TBDP
15.0000 mg | ORAL_TABLET | Freq: Every day | ORAL | Status: DC
  Administered 2016-01-30: 21:00:00 15 mg via ORAL
  Filled 2016-01-29 (×3): qty 1

## 2016-01-29 MED ORDER — ACETAMINOPHEN 325 MG PO TABS: 325.0000 mg | ORAL_TABLET | Freq: Four times a day (QID) | ORAL | Status: DC | PRN

## 2016-01-29 MED ORDER — ACETAMINOPHEN 650 MG RE SUPP: 650.0000 mg | Freq: Four times a day (QID) | RECTAL | Status: DC | PRN

## 2016-01-29 MED ORDER — IOPAMIDOL (ISOVUE-370) INJECTION 76%
75.0000 mL | Freq: Once | INTRAVENOUS | Status: AC | PRN
  Administered 2016-01-29: 75 mL via INTRAVENOUS

## 2016-01-29 MED ORDER — MECLIZINE HCL 25 MG PO TABS: 25.0000 mg | ORAL_TABLET | Freq: Three times a day (TID) | ORAL | Status: DC | PRN

## 2016-01-29 MED ORDER — ORAL CARE MOUTH RINSE
15.0000 mL | Freq: Two times a day (BID) | OROMUCOSAL | Status: DC
  Administered 2016-01-29 – 2016-02-02 (×6): 15 mL via OROMUCOSAL

## 2016-01-29 MED ORDER — POLYETHYLENE GLYCOL 3350 17 G PO PACK: 17.0000 g | PACK | Freq: Every day | ORAL | Status: DC | PRN

## 2016-01-29 MED ORDER — LEVOFLOXACIN IN D5W 500 MG/100ML IV SOLN
INTRAVENOUS | Status: AC
  Administered 2016-01-29: 500 mg via INTRAVENOUS
  Filled 2016-01-29: qty 100

## 2016-01-29 MED ORDER — ONDANSETRON HCL 4 MG/2ML IJ SOLN
4.0000 mg | Freq: Four times a day (QID) | INTRAMUSCULAR | Status: DC | PRN
  Administered 2016-01-31 – 2016-02-02 (×3): 4 mg via INTRAVENOUS
  Filled 2016-01-29 (×3): qty 2

## 2016-01-29 MED ORDER — DICYCLOMINE HCL 20 MG PO TABS
40.0000 mg | ORAL_TABLET | Freq: Four times a day (QID) | ORAL | Status: DC | PRN
  Filled 2016-01-29: qty 2

## 2016-01-29 MED ORDER — LEVETIRACETAM 750 MG PO TABS
750.0000 mg | ORAL_TABLET | Freq: Two times a day (BID) | ORAL | Status: DC
  Administered 2016-01-29 – 2016-02-02 (×7): 750 mg via ORAL
  Filled 2016-01-29 (×8): qty 1

## 2016-01-29 MED ORDER — INFLUENZA VAC SPLIT QUAD 0.5 ML IM SUSY
0.5000 mL | PREFILLED_SYRINGE | INTRAMUSCULAR | Status: AC
  Administered 2016-01-30: 08:00:00 0.5 mL via INTRAMUSCULAR
  Filled 2016-01-29: qty 0.5

## 2016-01-29 NOTE — ED Notes (Signed)
Changed diaaper for soft brown stool.  Had to do rectal temp due to it will not read axillary or oral.  Bare hugger applied for low temp.  Dennie Bibleat has a lot of old bruises on legs.  Also has about 2 cm open sore on right ankle over the bone.

## 2016-01-29 NOTE — ED Notes (Signed)
Low temp was documented in error. Pts rectal probe had fallen out without RN knowledge. Data has since been removed.

## 2016-01-29 NOTE — ED Notes (Signed)
pts spouse reports pt vomited x1 yesterday

## 2016-01-29 NOTE — H&P (Addendum)
[facility] - [place] at [facility]   PATIENT NAME: Nancy York    MR#:  [id number]  DATE OF BIRTH:  14-May-1945  DATE OF ADMISSION:  01/29/2016  PRIMARY CARE PHYSICIAN: SPARKS,JEFFREY D, MD   REQUESTING/REFERRING PHYSICIAN:   CHIEF COMPLAINT:   Chief Complaint  Patient presents with  . Numbness  . Back Pain    HISTORY OF PRESENT ILLNESS: Nancy York  is a 71 y.o. female with a known history of Heart murmur, hypertension, arthritis, diverticulosis, gastroesophageal reflux disease, recent hip surgery, femur fracture, who was in and out from placement, comes back to the hospital with weakness, inability to eat, weight loss, intermittent nausea, vomiting, feeling dizzy. In emergency room, she was noted to be hypothermic, intermittently tachypneic, hypoxic, her labs revealed pyuria, mild CO2 retention to 62, low PCO2, however, patient's pH was normal. Patient complains of numerous multiple medical problems, weakness, pain in the sides in the back, weight loss, some cough with clear to yellow phlegm, nausea, vomiting, constipation, increased frequency of urination, urge incontinence, numbness from her face down to toes, inability to move up her lower extremities, inability to eat due to numbness in the mouth and so forth, however, during my presentation she is able to turn from one side to the other side in the bed and able to use her extremities, plus she speaks fluently. Extensive workup in [facility] with brain MRI, unremarkable electroencephalogram, unremarkable, MRI of cervical spine, revealed multilevel degenerative changes with moderate to marked neuroforaminal narrowing, mild spinal canal narrowing. Numerous other studies were performed in [facility] and [facility] over the past one year.   PAST MEDICAL HISTORY:   Past Medical History:  Diagnosis Date  . Arthritis    joints, shoulder and knees  . Diverticulosis   . GERD (gastroesophageal reflux disease)   . Heart  murmur    no doctor follows her for this  . Hypertension     PAST SURGICAL HISTORY: Past Surgical History:  Procedure Laterality Date  . BILATERAL CARPAL TUNNEL RELEASE Bilateral   . DILATION AND CURETTAGE OF UTERUS    . FRACTURE SURGERY Left 2007   fractured left  hip  . HARDWARE REMOVAL Left 06/24/2015   Procedure: HARDWARE REMOVAL/ [facility];  Surgeon: Donato Heinz, MD;  Location: [facility] ORS;  Service: Orthopedics;  Laterality: Left;  . IRRIGATION AND DEBRIDEMENT ABSCESS Right 09/24/2015   Procedure: IRRIGATION AND DEBRIDEMENT OF RIGHT HIP ABSCESS;  Surgeon: Lattie Haw, MD;  Location: [facility] ORS;  Service: General;  Laterality: Right;  . JOINT REPLACEMENT Right 08/2014   total knee replacement  . KNEE ARTHROSCOPY Left   . ROTATOR CUFF REPAIR Right 2006   Dr. Gerrit Heck   . TONSILLECTOMY    . TOTAL HIP ARTHROPLASTY Left 06/24/2015   Procedure: TOTAL HIP ARTHROPLASTY;  Surgeon: Donato Heinz, MD;  Location: [facility] ORS;  Service: Orthopedics;  Laterality: Left;  . TOTAL KNEE ARTHROPLASTY Right 08/25/2014   Dr. Reita Chard    SOCIAL HISTORY:  Social History  Substance Use Topics  . Smoking status: Never Smoker  . Smokeless tobacco: Never Used     Comment: no passive smoke in household  . Alcohol use No    FAMILY HISTORY:  Family History  Problem Relation Age of Onset  . Diabetes Father   . Breast cancer Neg Hx     DRUG ALLERGIES:  Allergies  Allergen Reactions  . Statins Other (See Comments)    Reaction:  Leg cramps   .  Sulfa Antibiotics Nausea Only  . Other Rash    "leg cramps"  . Penicillins Rash and Other (See Comments)    Has patient had a PCN reaction causing immediate rash, facial/tongue/throat swelling, SOB or lightheadedness with hypotension: No Has patient had a PCN reaction causing severe rash involving mucus membranes or skin necrosis: No Has patient had a PCN reaction that required hospitalization No Has patient had a PCN reaction occurring within the last 10 years:  Yes If all of the above answers are "NO", then may proceed with Cephalosporin use.  . Tape Rash    Review of Systems  Constitutional: Positive for malaise/fatigue and weight loss. Negative for chills and fever.  HENT: Negative for congestion.   Eyes: Negative for blurred vision and double vision.  Respiratory: Positive for cough and sputum production. Negative for shortness of breath and wheezing.   Cardiovascular: Negative for chest pain, palpitations, orthopnea, leg swelling and PND.  Gastrointestinal: Positive for constipation, nausea and vomiting. Negative for abdominal pain, blood in stool and diarrhea.  Genitourinary: Positive for dysuria, flank pain, frequency and urgency. Negative for hematuria.  Musculoskeletal: Positive for back pain. Negative for falls.  Neurological: Positive for dizziness, sensory change, focal weakness and weakness. Negative for tremors and headaches.  Endo/Heme/Allergies: Does not bruise/bleed easily.  Psychiatric/Behavioral: Negative for depression. The patient does not have insomnia.     MEDICATIONS AT HOME:  Prior to Admission medications   Medication Sig Start Date End Date Taking? Authorizing Provider  acetaminophen (TYLENOL) 325 MG tablet Take 1 tablet (325 mg total) by mouth every 6 (six) hours as needed for mild pain (or Fever >/= 101). Patient not taking: Reported on 12/26/2015 07/21/15   Ramonita Lab, MD  acetic acid-hydrocortisone (VOSOL-HC) otic solution Place 3 drops into the left ear 3 (three) times daily. Patient not taking: Reported on 12/26/2015 11/02/15   Nita Sickle, MD  amLODipine (NORVASC) 10 MG tablet Take 5 mg by mouth daily. Take at 12 noon.    Historical Provider, MD  aspirin EC 81 MG tablet Take 81 mg by mouth every morning.     Historical Provider, MD  cetirizine (ZYRTEC) 10 MG tablet Take 10 mg by mouth daily as needed for allergies.    Historical Provider, MD  ciprofloxacin (CIPRO) 500 MG tablet Take 1 tablet (500 mg total) by  mouth 2 (two) times daily. 12/27/15   Srikar Sudini, MD  dicyclomine (BENTYL) 20 MG tablet Take 40 mg by mouth 4 (four) times daily as needed for spasms.     Historical Provider, MD  diphenhydrAMINE (BENADRYL) 25 mg capsule Take 1 capsule (25 mg total) by mouth every 8 (eight) hours as needed for itching or allergies. 07/21/15   Ramonita Lab, MD  gentamicin ointment (GARAMYCIN) 0.1 % Apply 1 application topically daily as needed. For dermatitis. 11/10/15   Historical Provider, MD  levETIRAcetam (KEPPRA) 750 MG tablet Take 750 mg by mouth 2 (two) times daily. 12/01/15   Historical Provider, MD  LORazepam (ATIVAN) 0.5 MG tablet Take 1 tablet (0.5 mg total) by mouth daily as needed for anxiety. 09/26/15   Milagros Loll, MD  meclizine (ANTIVERT) 25 MG tablet Take 25 mg by mouth 3 (three) times daily as needed for dizziness.    Historical Provider, MD  nystatin (MYCOSTATIN) 100000 UNIT/ML suspension Take 5 mLs by mouth 4 (four) times daily. 12/22/15   Historical Provider, MD  nystatin cream (MYCOSTATIN) Apply 1 application topically daily as needed for dry skin.  Historical Provider, MD  ondansetron (ZOFRAN-ODT) 8 MG disintegrating tablet Take 8 mg by mouth every 8 (eight) hours as needed for nausea or vomiting. Reported on 09/29/2015    Historical Provider, MD  polyethylene glycol (MIRALAX / GLYCOLAX) packet Take 17 g by mouth daily as needed for mild constipation. 12/28/15   Srikar Sudini, MD  potassium chloride SA (K-DUR,KLOR-CON) 20 MEQ tablet Take 20 mEq by mouth at bedtime. 12/02/15   Historical Provider, MD  RABEprazole (ACIPHEX) 20 MG tablet Take 20 mg by mouth 2 (two) times daily.    Historical Provider, MD  ranitidine (ZANTAC) 150 MG tablet Take 150 mg by mouth 2 (two) times daily as needed for heartburn.     Historical Provider, MD  vitamin B-12 (CYANOCOBALAMIN) 1000 MCG tablet Take 1,000 mcg by mouth every morning.    Historical Provider, MD  Vitamin D, Ergocalciferol, (DRISDOL) 50000 units CAPS capsule  Take 50,000 Units by mouth every 7 (seven) days. Pt takes on Sunday.    Historical Provider, MD      PHYSICAL EXAMINATION:   VITAL SIGNS: Blood pressure (!) 131/57, pulse 91, temperature (!) 95.9 F (35.5 C), resp. rate 20, height 5\' 2"  (1.575 m), weight 53.1 kg (117 lb), SpO2 94 %.  GENERAL:  70 y.o.-year-old patient lying in the bed with no acute distress, Somewhat anxious appearance.  EYES: Pupils equal, round, reactive to light and accommodation. No scleral icterus. Extraocular muscles intact.  HEENT: Head atraumatic, normocephalic. Oropharynx and nasopharynx clear.  NECK:  Supple, no jugular venous distention. No thyroid enlargement, no tenderness.  LUNGS: Normal breath sounds bilaterally, no wheezing, rales,rhonchi or crepitation. No use of accessory muscles of respiration.  CARDIOVASCULAR: S1, S2 normal. No murmurs, rubs, or gallops.  ABDOMEN: Soft, nontender, nondistended. Bowel sounds present. No organomegaly or mass. No CVA tenderness on percussion bilaterally EXTREMITIES: No pedal edema, cyanosis, or clubbing.  NEUROLOGIC: Cranial nerves II through XII are intact. Muscle strength 5/5 in all extremities. Sensation difficult to obtain since patient claims that she is numb all over her body, also admits of lower and upper extremity weakness, however, able to move her upper or lower extremities, able to turn from one side to the other side in bed. Gait not checked. Initially she would not able to plantarflex her right foot, however , plantar flexes the left one, upon further urging, she (on the pedal with her right foot as well PSYCHIATRIC: The patient is alert and oriented x 3.  SKIN: No obvious rash, lesion, or ulcer.   LABORATORY PANEL:   CBC  Recent Labs Lab 01/29/16 0838  WBC 10.3  HGB 13.0  HCT 37.7  PLT 490*  MCV 86.2  MCH 29.8  MCHC 34.6  RDW 16.7*  LYMPHSABS 3.8*  MONOABS 0.6  EOSABS 0.3  BASOSABS 0.1    ------------------------------------------------------------------------------------------------------------------  Chemistries   Recent Labs Lab 01/29/16 0838  NA 135  K 4.2  CL 100*  CO2 27  GLUCOSE 102*  BUN 11  CREATININE 0.69  CALCIUM 9.8  AST 24  ALT 19  ALKPHOS 108  BILITOT 0.4   ------------------------------------------------------------------------------------------------------------------  Cardiac Enzymes  Recent Labs Lab 01/29/16 1351  TROPONINI <0.03   ------------------------------------------------------------------------------------------------------------------  RADIOLOGY: Dg Chest Port 1 View  Result Date: 01/29/2016 CLINICAL DATA:  Weakness and numbness in legs. EXAM: PORTABLE CHEST 1 VIEW COMPARISON:  12/26/2015 CT chest FINDINGS: The heart size and mediastinal contours are within normal limits. Atherosclerosis of the aorta. Both lungs are clear. No pleural effusion or pneumothorax.  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 IMPRESSION: No acute pulmonary disease.  Aortic atherosclerosis. Electronically Signed   By: Tollie Ethavid  Kwon M.D.   On: 01/29/2016 15:32    EKG: Orders placed or performed during the hospital encounter of 01/29/16  . ED EKG  . ED EKG  EKG revealed normal sinus rhythm at 91 bpm, normal axis. Negative CT changes  IMPRESSION AND PLAN:  Principal Problem:   Sepsis (HCC) Active Problems:   Generalized weakness   Hypothermia   UTI (urinary tract infection)   Hypercapnia #1. Sepsis due to urinary tract infection, admit patient to medical floor, initiate her on the Rocephin, his urinary cultures #2. Urinary tract infection, initiate Rocephin, get cultures, adjust antibiotics depending on culture results #3. Hypothermia, due to infection as well as poor oral intake likely, get TSH, warming blanket #4 hypercapnia, hypoxia, suspected of COPD, sleep apnea, the patient cannot tolerate CPAP. Per family, hypoxia is  however concerning, chest x-ray was unremarkable, continue oxygen therapy, get CT angiogram of the chest #5. Diffuse body numbness per report, inability to eat due to numbness, concerning for psychiatric disease, get psychiatrist involved recommendations, initiate patient on Remeron #6. Failure to thrive adult, TSH, dietary consultation, Remeron, follow oral intake   All the records are reviewed and case discussed with ED provider. Management plans discussed with the patient, family and they are in agreement.  CODE STATUS: Code Status History    Date Active Date Inactive Code Status Order ID Comments User Context   12/26/2015  2:21 PM 12/28/2015  5:30 PM Full Code [id number]  Gracelyn NurseJohn D Johnston, MD Inpatient   09/22/2015  8:13 PM 09/26/2015  7:45 PM Full Code [id number]  Adrian SaranSital Mody, MD Inpatient   07/18/2015 10:41 PM 07/21/2015  7:44 PM Full Code [id number]  Oralia Manisavid Willis, MD Inpatient   08/25/2014  7:10 PM 08/28/2014  3:08 PM Full Code [id number]  Donato HeinzJames P Hooten, MD Inpatient   08/25/2014  7:10 PM 08/25/2014  7:10 PM Full Code [id number]  Donato HeinzJames P Hooten, MD Inpatient       TOTAL TIME TAKING CARE OF THIS PATIENT: 50 minutes.    Katharina CaperVAICKUTE,Sophina Mitten M.D on 01/29/2016 at 5:25 PM  Between 7am to 6pm - Pager - [phone] After 6pm go to [email or web address] - password [email or web address] [email or web address]  [facility] [place] [facility]  Office  [phone]  CC: Primary care physician; Marguarite ArbourSPARKS,JEFFREY D, MD

## 2016-01-29 NOTE — ED Triage Notes (Signed)
Pt arrives from home with reports of numbness in her legs that she has had "off and on" but today it is worse  [facility] and numbness from her neck up   6/10 pain reported to her back

## 2016-01-29 NOTE — ED Notes (Signed)
Report called to floor. Charge Nurse on unit requests pt is not brought to unit between 1845 and 1915 due to shift change. Pt currently in CT and will be back in ED room between this window. Pt will be taken to unit after 1915.

## 2016-01-29 NOTE — ED Provider Notes (Signed)
[facility] Emergency Department Provider Note ____________________________________________   I have reviewed the triage vital signs and the triage nursing note.  HISTORY  Chief Complaint Numbness and Back Pain   Historian Patient's daughter and husband Patient defers mostly to her daughter and husband, but is able to give me her account of how she feels today.  HPI Nancy York is a 70 y.o. female who has had a complicated medical course over the last several months including hip replacement, followed by femur fracture, followed [facility], and [place], and most recently a week and half [facility] she's had a variety of neurologic complaints including at this point what sounds like chronic numbness and tingling to the jaw and tongue, trouble swallowing for which she is now on pured thick liquids, and intermittent numbness and tingling to her legs. She reports that she's had multiple neurology evaluations at these different hospitals, but has not been well enough to make an outpatient follow-up.  She had been on Keppra because of possibility of seizures although the seizure workup had reportedly been negative.  [facility] she did have hypothermia, and it sounds like she had this at the previous [place] evaluation 2. Although they state that she was given antibiotics each time, nothing ever grew.  Family states that she has become extremely fatigued and physically deconditioned and hasn't stayed out of the hospital more than a week or so especially the last 3 hospitalizations. Her husband has to intermittently catheterize her for urine if she is unable to go on her own. She is hardly able to walk with a walker when she was discharged this last time, and today she was unable to stand due to generalized weakness.  She denies cough or trouble breathing. She denies chest pain. She denies  abdominal pain, nausea, bowel changes. No reported new neurologic symptoms including no headache, seizure activity, new weakness or numbness, she does have the chronic numbness and tingling around her face at bilateral jaw area.  Ultimately she is unable to care for herself, and she's become so deconditioned that her husband cannot care for her.  It sounds like there has never been a cause for why her body temperature was hypothermic upon twice hospital admissions.     Past Medical History:  Diagnosis Date  . Arthritis    joints, shoulder and knees  . Diverticulosis   . GERD (gastroesophageal reflux disease)   . Heart murmur    no doctor follows her for this  . Hypertension     Patient Active Problem List   Diagnosis Date Noted  . Protein-calorie malnutrition, severe 12/28/2015  . Hypoxia 12/26/2015  . Disease of thyroid gland 10/01/2015  . BP (high blood pressure) 10/01/2015  . HLD (hyperlipidemia) 10/01/2015  . Allergy to environmental factors 10/01/2015  . Pressure ulcer 09/24/2015  . Abscess and cellulitis   . Abscess 09/22/2015  . 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 Moderate protein-energy malnutrition (HCC) 08/10/2015  . Neuritis 08/05/2015  . Anemia in chronic illness 08/05/2015  . Episode of syncope 08/02/2015  . Closed fracture of distal end of femur (HCC) 08/02/2015  . Facial numbness 07/25/2015  . Can't get food down 07/25/2015  . Hyponatremia 07/18/2015  . Generalized weakness 07/18/2015  . GERD (gastroesophageal reflux disease) 07/18/2015  . HTN (hypertension) 07/18/2015  . Dehydration 07/18/2015  . 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 H/O total  knee replacement 09/14/2014  . 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 OA (osteoarthritis) of knee 08/24/2014  . B12 deficiency 08/04/2014    Past Surgical History:  Procedure Laterality Date  . BILATERAL CARPAL TUNNEL RELEASE Bilateral   . DILATION AND CURETTAGE OF UTERUS     . FRACTURE SURGERY Left 2007   fractured left  hip  . HARDWARE REMOVAL Left 06/24/2015   Procedure: HARDWARE REMOVAL/ DHS;  Surgeon: Donato Heinz, MD;  Location: [facility] ORS;  Service: Orthopedics;  Laterality: Left;  . IRRIGATION AND DEBRIDEMENT ABSCESS Right 09/24/2015   Procedure: IRRIGATION AND DEBRIDEMENT OF RIGHT HIP ABSCESS;  Surgeon: Lattie Haw, MD;  Location: [facility] ORS;  Service: General;  Laterality: Right;  . JOINT REPLACEMENT Right 08/2014   total knee replacement  . KNEE ARTHROSCOPY Left   . ROTATOR CUFF REPAIR Right 2006   Dr. Gerrit Heck   . TONSILLECTOMY    . TOTAL HIP ARTHROPLASTY Left 06/24/2015   Procedure: TOTAL HIP ARTHROPLASTY;  Surgeon: Donato Heinz, MD;  Location: [facility] ORS;  Service: Orthopedics;  Laterality: Left;  . TOTAL KNEE ARTHROPLASTY Right 08/25/2014   Dr. Reita Chard    Prior to Admission medications   Medication Sig Start Date End Date Taking? Authorizing Provider  acetaminophen (TYLENOL) 325 MG tablet Take 1 tablet (325 mg total) by mouth every 6 (six) hours as needed for mild pain (or Fever >/= 101). Patient not taking: Reported on 12/26/2015 07/21/15   Ramonita Lab, MD  acetic acid-hydrocortisone (VOSOL-HC) otic solution Place 3 drops into the left ear 3 (three) times daily. Patient not taking: Reported on 12/26/2015 11/02/15   Nita Sickle, MD  amLODipine (NORVASC) 10 MG tablet Take 5 mg by mouth daily. Take at 12 noon.    Historical Provider, MD  aspirin EC 81 MG tablet Take 81 mg by mouth every morning.     Historical Provider, MD  cetirizine (ZYRTEC) 10 MG tablet Take 10 mg by mouth daily as needed for allergies.    Historical Provider, MD  ciprofloxacin (CIPRO) 500 MG tablet Take 1 tablet (500 mg total) by mouth 2 (two) times daily. 12/27/15   Srikar Sudini, MD  dicyclomine (BENTYL) 20 MG tablet Take 40 mg by mouth 4 (four) times daily as needed for spasms.     Historical Provider, MD  diphenhydrAMINE (BENADRYL) 25 mg capsule Take 1 capsule (25 mg total)  by mouth every 8 (eight) hours as needed for itching or allergies. 07/21/15   Ramonita Lab, MD  gentamicin ointment (GARAMYCIN) 0.1 % Apply 1 application topically daily as needed. For dermatitis. 11/10/15   Historical Provider, MD  levETIRAcetam (KEPPRA) 750 MG tablet Take 750 mg by mouth 2 (two) times daily. 12/01/15   Historical Provider, MD  LORazepam (ATIVAN) 0.5 MG tablet Take 1 tablet (0.5 mg total) by mouth daily as needed for anxiety. 09/26/15   Milagros Loll, MD  meclizine (ANTIVERT) 25 MG tablet Take 25 mg by mouth 3 (three) times daily as needed for dizziness.    Historical Provider, MD  nystatin (MYCOSTATIN) 100000 UNIT/ML suspension Take 5 mLs by mouth 4 (four) times daily. 12/22/15   Historical Provider, MD  nystatin cream (MYCOSTATIN) Apply 1 application topically daily as needed for dry skin.     Historical Provider, MD  ondansetron (ZOFRAN-ODT) 8 MG disintegrating tablet Take 8 mg by mouth every 8 (eight) hours as needed for nausea or vomiting. Reported on 09/29/2015    Historical Provider, MD  polyethylene glycol (MIRALAX /  GLYCOLAX) packet Take 17 g by mouth daily as needed for mild constipation. 12/28/15   Srikar Sudini, MD  potassium chloride SA (K-DUR,KLOR-CON) 20 MEQ tablet Take 20 mEq by mouth at bedtime. 12/02/15   Historical Provider, MD  RABEprazole (ACIPHEX) 20 MG tablet Take 20 mg by mouth 2 (two) times daily.    Historical Provider, MD  ranitidine (ZANTAC) 150 MG tablet Take 150 mg by mouth 2 (two) times daily as needed for heartburn.     Historical Provider, MD  vitamin B-12 (CYANOCOBALAMIN) 1000 MCG tablet Take 1,000 mcg by mouth every morning.    Historical Provider, MD  Vitamin D, Ergocalciferol, (DRISDOL) 50000 units CAPS capsule Take 50,000 Units by mouth every 7 (seven) days. Pt takes on Sunday.    Historical Provider, MD    Allergies  Allergen Reactions  . Statins Other (See Comments)    Reaction:  Leg cramps   . Sulfa Antibiotics Nausea Only  . Other Rash    "leg  cramps"  . Penicillins Rash and Other (See Comments)    Has patient had a PCN reaction causing immediate rash, facial/tongue/throat swelling, SOB or lightheadedness with hypotension: No Has patient had a PCN reaction causing severe rash involving mucus membranes or skin necrosis: No Has patient had a PCN reaction that required hospitalization No Has patient had a PCN reaction occurring within the last 10 years: Yes If all of the above answers are "NO", then may proceed with Cephalosporin use.  . Tape Rash    Family History  Problem Relation Age of Onset  . Diabetes Father   . Breast cancer Neg Hx     Social History Social History  Substance Use Topics  . Smoking status: Never Smoker  . Smokeless tobacco: Never Used     Comment: no passive smoke in household  . Alcohol use No    Review of Systems  Constitutional: Negative for fever. Eyes: Negative for visual changes. ENT: Negative for sore throat. Cardiovascular: Negative for chest pain. Respiratory: Negative for shortness of breath. Gastrointestinal: Negative for abdominal pain, vomiting and diarrhea. Genitourinary: Negative for dysuria. Musculoskeletal: Negative for back pain. Skin: Negative for rash. Neurological: Negative for headache. 10 point Review of Systems otherwise negative ____________________________________________   PHYSICAL EXAM:  VITAL SIGNS: ED Triage Vitals  Enc Vitals Group     BP 01/29/16 0832 (!) 108/43     Pulse Rate 01/29/16 0832 94     Resp 01/29/16 0832 19     Temp 01/29/16 1040 (!) 95.6 F (35.3 C)     Temp Source 01/29/16 1040 Rectal     SpO2 01/29/16 0832 98 %     Weight 01/29/16 0832 117 lb (53.1 kg)     Height 01/29/16 0832 5\' 2"  (1.575 m)     Head Circumference --      Peak Flow --      Pain Score 01/29/16 0833 6     Pain Loc --      Pain Edu? --      Excl. in GC? --      Constitutional: Alert and oriented. Appears somewhat cachectic and extremely fatigued, but no acute  distress. HEENT   Head: Normocephalic and atraumatic.      Eyes: Conjunctivae are normal. PERRL. Normal extraocular movements.      Ears:         Nose: No congestion/rhinnorhea.   Mouth/Throat: Mucous membranes are mildly dry.   Neck: No stridor. Cardiovascular/Chest: Normal rate, regular rhythm.  No murmurs, rubs, or gallops. Respiratory: Normal respiratory effort without tachypnea nor retractions. Breath sounds are clear and equal bilaterally. No wheezes/rales/rhonchi. Gastrointestinal: Soft. No distention, no guarding, no rebound. Nontender.   Genitourinary/rectal:Deferred Musculoskeletal: Nontender with normal range of motion in all extremities. No joint effusions.  No lower extremity tenderness.  No edema. Neurologic:  No facial droop. No slurred speech. No expressive or receptive aphasia. She does report paresthesia in the V3 distribution bilaterally. V1 and V2 she is reported as normal. 5 out of 5 strength in 4 extremities. No extremity sensory deficits today.  Skin:  Skin is warm, dry and intact. No rash noted. Psychiatric: Mood and affect are normal. Speech and behavior are normal. Patient exhibits appropriate insight and judgment.   ____________________________________________  LABS (pertinent positives/negatives)  Labs Reviewed  APTT - Abnormal; Notable for the following:       Result Value   aPTT 39 (*)    All other components within normal limits  CBC - Abnormal; Notable for the following:    RDW 16.7 (*)    Platelets 490 (*)    All other components within normal limits  DIFFERENTIAL - Abnormal; Notable for the following:    Lymphs Abs 3.8 (*)    All other components within normal limits  COMPREHENSIVE METABOLIC PANEL - Abnormal; Notable for the following:    Chloride 100 (*)    Glucose, Bld 102 (*)    Albumin 3.3 (*)    All other components within normal limits  CULTURE, BLOOD (ROUTINE X 2)  CULTURE, BLOOD (ROUTINE X 2)  URINE CULTURE  ETHANOL   PROTIME-INR  TROPONIN I  LACTIC ACID, PLASMA  LACTIC ACID, PLASMA  URINALYSIS COMPLETEWITH MICROSCOPIC ([facility] ONLY)  Urinalysis is pending  ____________________________________________    EKG I, Governor Rooks, MD, the attending physician have personally viewed and interpreted all ECGs.  91 bpm. Normal sinus rhythm. Narrow QRS. Normal axis. Normal ST and T-wave ____________________________________________  RADIOLOGY All Xrays were viewed by me. Imaging interpreted by Radiologist.  Chest x-ray portable: Pending __________________________________________  PROCEDURES  Procedure(s) performed: None  Critical Care performed: None  ____________________________________________   ED COURSE / ASSESSMENT AND PLAN  Pertinent labs & imaging results that were available during my care of the patient were reviewed by me and considered in my medical decision making (see chart for details).   Nancy York was noted to be hypothermic rectally and placed on a bear hugger and her body temperature did increase to 97.  The reason she was brought and is due to extreme fatigue and deconditioning and near syncope, she was unable to stand up on her own today.  She has had numerous hospitalizations over the past several months including 2 orthopedic surgeries, but most recently several hospitalizations related to hypothermia and neurologic complaints, but it sounds like no certain etiology has been identified. In any case, family and patient report that with each hospitalization, she is able to make it less and less duration at home before returning to the emergency department. She has lost a significant amount of weight, and today cannot stand on her own. She is going to need admission and evaluation for possible placement.  Today she is not reporting any new neurologic findings and internal think she needs any new neuro imaging today.  She was found to be hypothermic, and am unsure of the etiology  of this but did resolve with the bear hugger. She's not reporting symptoms otherwise for infection and reports that the past  several episodes with hypothermia no infection group. Her laboratory studies are reassuring with no elevated white blood cell count. She does not have hypotension. At this point in time I'm not inclined to add additional antibiotics. I am awaiting urinalysis and chest x-ray.  Patient care transferred to hospitalist, urinalysis and chest x-ray are still pending. I will defer any additional treatment for hypothermia with respect to the possibility of infection or sepsis to the hospitalist. I do not suspect sepsis.     CONSULTATIONS:   Hospitalist for admission.   Patient / Family / Caregiver informed of clinical course, medical decision-making process, and agree with plan.   ___________________________________________   FINAL CLINICAL IMPRESSION(S) / ED DIAGNOSES   Final diagnoses:  Generalized muscle weakness  Hypothermia, initial encounter              Note: This dictation was prepared with Dragon dictation. Any transcriptional errors that result from this process are unintentional    Governor Rooks, MD 01/29/16 1524

## 2016-01-30 ENCOUNTER — Inpatient Hospital Stay: Payer: Commercial Managed Care - HMO

## 2016-01-30 ENCOUNTER — Encounter: Payer: Self-pay | Admitting: Psychiatry

## 2016-01-30 DIAGNOSIS — F0631 Mood disorder due to known physiological condition with depressive features: Secondary | ICD-10-CM

## 2016-01-30 LAB — BASIC METABOLIC PANEL
Anion gap: 6 (ref 5–15)
BUN: 10 mg/dL (ref 6–20)
CHLORIDE: 103 mmol/L (ref 101–111)
CO2: 26 mmol/L (ref 22–32)
CREATININE: 0.64 mg/dL (ref 0.44–1.00)
Calcium: 9.1 mg/dL (ref 8.9–10.3)
GFR calc non Af Amer: >60 mL/min (ref >60)
Glucose, Bld: 80 mg/dL (ref 65–99)
POTASSIUM: 4 mmol/L (ref 3.5–5.1)
Sodium: 135 mmol/L (ref 135–145)

## 2016-01-30 LAB — CBC
HEMATOCRIT: 35.2 % (ref 35.0–47.0)
Hemoglobin: 12 g/dL (ref 12.0–16.0)
MCH: 29.9 pg (ref 26.0–34.0)
MCHC: 34.3 g/dL (ref 32.0–36.0)
MCV: 87.1 fL (ref 80.0–100.0)
PLATELETS: 371 10*3/uL (ref 150–440)
RBC: 4.03 MIL/uL (ref 3.80–5.20)
RDW: 16.8 % — ABNORMAL HIGH (ref 11.5–14.5)
WBC: 8.7 10*3/uL (ref 3.6–11.0)

## 2016-01-30 LAB — GLUCOSE, CAPILLARY
Glucose-Capillary: 72 mg/dL (ref 65–99)
Glucose-Capillary: 80 mg/dL (ref 65–99)

## 2016-01-30 LAB — TROPONIN I: Troponin I: <0.03 ng/mL (ref <0.03)

## 2016-01-30 MED ORDER — LEVOFLOXACIN 500 MG PO TABS
500.0000 mg | ORAL_TABLET | Freq: Every day | ORAL | Status: AC
  Administered 2016-01-30 – 2016-02-02 (×4): 500 mg via ORAL
  Filled 2016-01-30 (×4): qty 1

## 2016-01-30 MED ORDER — OXYCODONE-ACETAMINOPHEN 5-325 MG PO TABS
1.0000 | ORAL_TABLET | Freq: Four times a day (QID) | ORAL | Status: DC | PRN
  Administered 2016-01-30 – 2016-02-01 (×2): 1 via ORAL
  Filled 2016-01-30 (×2): qty 1

## 2016-01-30 MED ORDER — DULOXETINE HCL 30 MG PO CPEP
30.0000 mg | ORAL_CAPSULE | Freq: Every day | ORAL | Status: DC
  Administered 2016-01-30 – 2016-02-02 (×4): 30 mg via ORAL
  Filled 2016-01-30 (×5): qty 1

## 2016-01-30 MED ORDER — MORPHINE SULFATE (PF) 2 MG/ML IV SOLN
1.0000 mg | INTRAVENOUS | Status: DC | PRN
  Administered 2016-01-30 – 2016-02-02 (×4): 1 mg via INTRAVENOUS
  Filled 2016-01-30 (×4): qty 1

## 2016-01-30 NOTE — Evaluation (Signed)
Physical Therapy Evaluation Patient Details Name: TATEANNA BACH MRN: [id number] DOB: 11-09-1945 Today's Date: 01/30/2016   History of Present Illness  Pt admitted for sepsis. Pt with history of acute T5 and T9 compression fractures. Pt with complaints of numbness and back pain. Pt with chronic history of heart murmur, HTN, GERD, and hip Sx in 06/2015.   Clinical Impression  Pt is a pleasant 70 year old female who was admitted for sepsis. Pt performs bed mobility with min assist and  Transfers with mod/max assist and RW. Pt with decreased sensation in R LE distal to knee and L foot. Pt reports she is unable to feel floor while standing. Pt with functional weakness noted in B knees despite able to perform resisted there-ex while supine in bed.  Pt demonstrates deficits with strength/mobility/sensation. Pt with strong B UE, however limited use during functional mobility. Would benefit from skilled PT to address above deficits and promote optimal return to PLOF; recommend transition to STR upon discharge from acute hospitalization. Unsafe to perform mobility this date as pt unable to stand and is high risk for falls.        Follow Up Recommendations SNF    Equipment Recommendations       Recommendations for Other Services       Precautions / Restrictions Precautions Precautions: Fall Restrictions Weight Bearing Restrictions: No      Mobility  Bed Mobility Overal bed mobility: Needs Assistance Bed Mobility: Supine to Sit     Supine to sit: Min assist     General bed mobility comments: assist for sliding B LE off bed. Pt also needs assist for trunk stability once seated at EOB. Pt then able to progress to sitting with supervision. Forward flexed posture noted during static sitting.  Transfers Overall transfer level: Needs assistance Equipment used: Rolling walker (2 wheeled) Transfers: Sit to/from Stand Sit to Stand: Mod assist;Max assist         General transfer comment:  assist for transfer to stand at EOB. Pt with complaints of B feet numbness, unable to feel floor. B Knee buckling noted during standing. Pt with controlled descent back to bed. 2nd attempt performed with therapist blocking B knees, pt unable to fully stand with upright posture. Unsafe to perform further mobility at this time  Ambulation/Gait             General Gait Details: unsafe seocndary to numbness reported and B knee buckling  Stairs            Wheelchair Mobility    Modified Rankin (Stroke Patients Only)       Balance Overall balance assessment: Needs assistance;History of Falls Sitting-balance support: Feet supported;Bilateral upper extremity supported Sitting balance-Leahy Scale: Fair     Standing balance support: Bilateral upper extremity supported Standing balance-Leahy Scale: Poor                               Pertinent Vitals/Pain Pain Assessment: Faces Faces Pain Scale: Hurts a little bit Pain Location: low back Pain Descriptors / Indicators: Dull;Discomfort Pain Intervention(s): Limited activity within patient's tolerance    Home Living Family/patient expects to be discharged to:: Private residence Living Arrangements: Spouse/significant other Available Help at Discharge: Family Type of Home: House Home Access: Ramped entrance     Home Layout: One level Home Equipment: Environmental consultant - 2 wheels;Wheelchair - manual      Prior Function Level of Independence: Independent with assistive  device(s)         Comments: Has been ambulating in home with RW prior to admission     Hand Dominance        Extremity/Trunk Assessment   Upper Extremity Assessment: Overall WFL for tasks assessed           Lower Extremity Assessment: Generalized weakness (Proximal weakness noted B LE knee 3+/5; ankles 4/5)         Communication   Communication: No difficulties  Cognition Arousal/Alertness: Awake/alert Behavior During Therapy: WFL for  tasks assessed/performed Overall Cognitive Status: Within Functional Limits for tasks assessed                      General Comments      Exercises Other Exercises Other Exercises: supine ther-ex performed including B LE SLRs, hip abd/add, and resisted heel slides. All ther-ex performed x 10 reps with min assist for correct technique. All ther-ex written and explained to patient and family.   Assessment/Plan    PT Assessment Patient needs continued PT services  PT Problem List Decreased strength;Decreased activity tolerance;Decreased balance;Decreased coordination;Decreased mobility;Pain;Impaired sensation          PT Treatment Interventions DME instruction;Gait training;Therapeutic exercise    PT Goals (Current goals can be found in the Care Plan section)  Acute Rehab PT Goals Patient Stated Goal: to get stronger PT Goal Formulation: With patient Time For Goal Achievement: 02/13/16 Potential to Achieve Goals: Good    Frequency Min 2X/week   Barriers to discharge        Co-evaluation               End of Session Equipment Utilized During Treatment: Gait belt Activity Tolerance: Patient tolerated treatment well Patient left: in bed;with bed alarm set Nurse Communication: Mobility status         Time: 1126-1200 PT Time Calculation (min) (ACUTE ONLY): 34 min   Charges:   PT Evaluation $PT Eval Moderate Complexity: 1 Procedure PT Treatments $Therapeutic Exercise: 8-22 mins   PT G Codes:        Kohler Pellerito 01/30/2016, 1:03 PM  Elizabeth PalauStephanie Dequan Kindred, PT, DPT [phone]

## 2016-01-30 NOTE — Progress Notes (Signed)
[facility] [place] NAME: Nancy York    MR#:  [id number]  DATE OF BIRTH:  Sep 19, 1945  SUBJECTIVE:   Pt. Here due to lower back pain, difficulty walking.  Noted to have a UTI.  Has had multitude of neurological complaints and an extensive workup at [facility] which was negative.  REVIEW OF SYSTEMS:    Review of Systems  Constitutional: Negative for chills and fever.  HENT: Negative for congestion and tinnitus.   Eyes: Negative for blurred vision and double vision.  Respiratory: Negative for cough, shortness of breath and wheezing.   Cardiovascular: Negative for chest pain, orthopnea and PND.  Gastrointestinal: Negative for abdominal pain, diarrhea, nausea and vomiting.  Genitourinary: Negative for dysuria and hematuria.  Musculoskeletal: Positive for back pain.  Neurological: Positive for focal weakness (Lower ext. weakness. ) and weakness. Negative for dizziness and sensory change.  All other systems reviewed and are negative.   Nutrition: dysphagia II with nectar thick liquid Tolerating Diet: Yes Tolerating PT: Await Eval.      DRUG ALLERGIES:   Allergies  Allergen Reactions  . Statins Other (See Comments)    Reaction:  Leg cramps   . Sulfa Antibiotics Nausea Only  . Other Rash    "leg cramps"  . Penicillins Rash and Other (See Comments)    Has patient had a PCN reaction causing immediate rash, facial/tongue/throat swelling, SOB or lightheadedness with hypotension: No Has patient had a PCN reaction causing severe rash involving mucus membranes or skin necrosis: No Has patient had a PCN reaction that required hospitalization No Has patient had a PCN reaction occurring within the last 10 years: Yes If all of the above answers are "NO", then may proceed with Cephalosporin use.  . Tape Rash    VITALS:  Blood pressure 121/71, pulse 100, temperature 97.5 F (36.4 C), temperature source Oral, resp. rate 16, height 5' 2"   (1.575 m), weight 54.4 kg (120 lb), SpO2 92 %.  PHYSICAL EXAMINATION:   Physical Exam  GENERAL:  70 y.o.-year-old patient lying in the bed in no acute distress.  EYES: Pupils equal, round, reactive to light and accommodation. No scleral icterus. Extraocular muscles intact.  HEENT: Head atraumatic, normocephalic. Oropharynx and nasopharynx clear.  NECK:  Supple, no jugular venous distention. No thyroid enlargement, no tenderness.  LUNGS: Normal breath sounds bilaterally, no wheezing, rales, rhonchi. No use of accessory muscles of respiration.  CARDIOVASCULAR: S1, S2 normal. No murmurs, rubs, or gallops.  ABDOMEN: Soft, nontender, nondistended. Bowel sounds present. No organomegaly or mass.  EXTREMITIES: No cyanosis, clubbing or edema b/l.    NEUROLOGIC: Cranial nerves II through XII are intact. No focal Motor or sensory deficits b/l.  Globally weak.     PSYCHIATRIC: The patient is alert and oriented x 3.  SKIN: No obvious rash, lesion, or ulcer.    LABORATORY PANEL:   CBC  Recent Labs Lab 01/30/16 0713  WBC 8.7  HGB 12.0  HCT 35.2  PLT 371   ------------------------------------------------------------------------------------------------------------------  Chemistries   Recent Labs Lab 01/29/16 0838 01/30/16 0713  NA 135 135  K 4.2 4.0  CL 100* 103  CO2 27 26  GLUCOSE 102* 80  BUN 11 10  CREATININE 0.69 0.64  CALCIUM 9.8 9.1  AST 24  --   ALT 19  --   ALKPHOS 108  --   BILITOT 0.4  --    ------------------------------------------------------------------------------------------------------------------  Cardiac Enzymes  Recent Labs Lab 01/30/16 [id number]  TROPONINI <0.03   ------------------------------------------------------------------------------------------------------------------  RADIOLOGY:  Ct Angio Chest Pe W Or Wo Contrast  Result Date: 01/29/2016 CLINICAL DATA:  Numbness in the legs off and on but worse today. Recent hospitalization for pneumonia.  Back pain. Acute respiratory failure with hypoxia. EXAM: CT ANGIOGRAPHY CHEST WITH CONTRAST TECHNIQUE: Multidetector CT imaging of the chest was performed using the standard protocol during bolus administration of intravenous contrast. Multiplanar CT image reconstructions and MIPs were obtained to evaluate the vascular anatomy. CONTRAST:  75 mL Isovue 370 COMPARISON:  12/26/2015 FINDINGS: Cardiovascular: Technically adequate study with good opacification of the central and segmental pulmonary arteries. No focal filling defects demonstrated. No evidence of significant pulmonary embolus. Normal heart size. Coronary artery and aortic calcifications. Normal caliber thoracic aorta. No aortic dissection. Great vessel origins are patent. Mediastinum/Nodes: Scattered lymph nodes in the mediastinum are not pathologically enlarged. Esophagus is decompressed. Lungs/Pleura: Lungs are clear. No pleural effusion or pneumothorax. Upper Abdomen: No acute abnormality. Musculoskeletal: Multiple vertebral compression fractures, including T5, T9, and L1. The T5 and T9 fractures are new since the previous study, suggesting acute compression. This likely indicates osteoporotic fractures. No retropulsion of fracture fragments. T5 demonstrates about 50% compression and T9 demonstrates about 75% compression. Review of the MIP images confirms the above findings. IMPRESSION: No evidence of significant pulmonary embolus. No evidence of active pulmonary disease. Fractures of the T9 and T5 vertebral bodies, new since previous study of 12/26/2015 suggesting acute fractures, likely osteoporotic. Electronically Signed   By: Lucienne Capers M.D.   On: 01/29/2016 18:59   Dg Chest Port 1 View  Result Date: 01/29/2016 CLINICAL DATA:  Weakness and numbness in legs. EXAM: PORTABLE CHEST 1 VIEW COMPARISON:  12/26/2015 CT chest FINDINGS: The heart size and mediastinal contours are within normal limits. Atherosclerosis of the aorta. Both lungs are  clear. No pleural effusion or pneumothorax. 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 IMPRESSION: No acute pulmonary disease.  Aortic atherosclerosis. Electronically Signed   By: Ashley Royalty M.D.   On: 01/29/2016 15:32     ASSESSMENT AND PLAN:   70 year old female with past medical history of GERD, hypertension, diverticulosis, osteoarthritis, who presented to the hospital yesterday due to weakness, difficulty eating, weight loss, intermittent nausea vomiting and feeling dizzy. She was noted to have urinary tract infection.  1. Sepsis-patient met sepsis criteria given her hypothermia, tachycardia on admission along with abnormal urinalysis. -Continue IV Levaquin, follow blood, urine cultures. Patient is currently afebrile and hemodynamically stable.  2. Urinary tract infection-source of patient's sepsis. Continue IV Levaquin. Follow urine cultures.  3. Difficulty ambulating, numbness, difficulty eating-patient has a conglomerate of neurological complaints and has had an extensive workup at [facility] and at [facility] which has been essentially benign.  -She's had a negative MRI head, negative EMG study. Await physical therapy evaluation, Speech eval. . -I will defer this follow up as an outpatient. Inpatient workup not needed.  4. Hx of Seizures - cont. Keppra  5. GERD - cont. Protonix.   6. Essential HTN - cont. Norvasc.   7. Depression - ?? Neurologic complaints due to underlying psych illness.  - await Psych eval.  Cont. Remeron  All the records are reviewed and case discussed with Care Management/Social Worker. Management plans discussed with the patient, family and they are in agreement.  CODE STATUS: Full code  DVT Prophylaxis: Lovenox  TOTAL TIME TAKING CARE OF THIS PATIENT: 30 minutes.   POSSIBLE D/C IN 1-2 DAYS, DEPENDING ON CLINICAL CONDITION.  Henreitta Leber M.D on 01/30/2016 at 12:19 PM  Between 7am to 6pm - Pager - [phone]  After  6pm go to [email or web address] - Proofreader  [facility]  Office  [phone]  CC: Primary care physician; Idelle Crouch, MD

## 2016-01-30 NOTE — Progress Notes (Signed)
PHARMACIST - PHYSICIAN COMMUNICATION  CONCERNING: Antibiotic IV to Oral Route Change Policy  RECOMMENDATION: This patient is receiving LEVOFLOXACIN by the intravenous route.  Based on criteria approved by the Pharmacy and Therapeutics Committee, the antibiotic(s) is/are being converted to the equivalent oral dose form(s).   DESCRIPTION: These criteria include:  Patient being treated for a respiratory tract infection, urinary tract infection, cellulitis or clostridium difficile associated diarrhea if on metronidazole  The patient is not neutropenic and does not exhibit a GI malabsorption state  The patient is eating (either orally or via tube) and/or has been taking other orally administered medications for a least 24 hours  The patient is improving clinically and has a Tmax < 100.5  If you have questions about this conversion, please contact the Pharmacy Department   [x]   [phone]( [phone] )  [facility]  Cher NakaiSheema Arvell Pulsifer, PharmD Clinical Pharmacist 01/30/2016 10:10 AM

## 2016-01-30 NOTE — Consult Note (Addendum)
[place] [facility] Consult   Reason for Consult:  Rule out psychosomatic disorder Referring Physician:  Hospitalist Patient Identification: Nancy York MRN:  [id number] Principal Diagnosis: Sepsis [facility]) Diagnosis:   Patient Active Problem List   Diagnosis Date Noted  . Sepsis ([place]) [A41.9] 01/29/2016  . Hypothermia [T68.XXXA] 01/29/2016  . UTI (urinary tract infection) [N39.0] 01/29/2016  . Hypercapnia [R06.89] 01/29/2016  . Protein-calorie malnutrition, severe [E43] 12/28/2015  . Hypoxia [R09.02] 12/26/2015  . Disease of thyroid gland [E07.9] 10/01/2015  . BP (high blood pressure) [I10] 10/01/2015  . HLD (hyperlipidemia) [E78.5] 10/01/2015  . Allergy to environmental factors [Z91.09] 10/01/2015  . Pressure ulcer [L89.90] 09/24/2015  . Abscess and cellulitis [L03.90, L02.91]   . Abscess [L02.91] 09/22/2015  . 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 Moderate protein-energy malnutrition ([place]) [E44.0] 08/10/2015  . Neuritis [M79.2] 08/05/2015  . Anemia in chronic illness [D63.8] 08/05/2015  . Episode of syncope [R55] 08/02/2015  . Closed fracture of distal end of femur ([place]) [S72.409A] 08/02/2015  . Facial numbness [R20.0] 07/25/2015  . Can't get food down [IMO0001] 07/25/2015  . Hyponatremia [E87.1] 07/18/2015  . Generalized weakness [R53.1] 07/18/2015  . GERD (gastroesophageal reflux disease) [K21.9] 07/18/2015  . HTN (hypertension) [I10] 07/18/2015  . Dehydration [E86.0] 07/18/2015  . Adynamia [G72.3] 07/18/2015  . S/P total hip arthroplasty [Z96.649] 06/24/2015  . H/O total knee replacement [Z96.659] 09/14/2014  . Total knee replacement status [Z96.659] 08/25/2014  . OA (osteoarthritis) of knee [M17.10] 08/24/2014  . B12 deficiency [E53.8] 08/04/2014    Total Time spent with patient: 1.5 hours  Subjective:   Nancy York is a 70 y.o. female patient admitted with sepsis  HPI:   Patient is a 70 year old Caucasian female with no known prior  psychiatric history who has been hospitalized a multitude of times in our facility, [facility] since March of this year. Patient states she suffered a hip fracture back in March for which she required a hip replacement, after that she suffered cellulitis on the site of the surgery. Secondary to the use of antibiotics  she developed GI complications. She also has been hospitalized for sepsis and has required to be admitted to a skilled nursing facility for physical therapy. During her recent stay at the nursing facility she developed decubitus ulcers. Patient also has had admissions for dizziness and falls.  She was just discharged from [facility] (9/26) about a week ago she had a complicated course and require intubation and admission to MICU. Patient presented to the ER on September 9 with weakness and inability to sustain along with altered mental status. Presentation was consistent with severe encephalopathy. She was admitted to MICU when she developed hypoxia hyperthermia and worsening mental status the require intubation. After she was extubated the reported patient had central apnea and was started on a BiPAP.  She was hospitalized here again as a result of sepsis secondary to her UTI.   Psychiatry was consulted because the patient continues to report neurological symptoms that cannot be clearly explain and has been reporting weakness and inability to stand and walk and participate in physical therapy.  PT evaluated the patient today and they felt that she has  deficits with strength/mobility/sensation. They are recommending inpatient rehabilitation as pt unable to stand and is high risk for falls.   Patient states that prior to March she was a very active one month who was taking care of her small grandson. She attended church 3 times a day.  She attended all her grandchildren soccer games. She took care of all the household chores.  Basically since March and she has been unable to care  for herself without the help of family members. She said that her family members have been strongly supported and somebody from the whole family is always taking care of her. She has received a lot of support as well from her church family. Patient denies having any other stressors at this time such as relational problems, finances or any worries about her children or grandchildren.  Despite all of her complicated medical history since March of this. Patient reports she is in good spirits. The family also reports that patient appears to be coping well with the stressors. They report that when she was at the skilled nursing facility receiving physical rehabilitation she did appear depressed and might have a panic attack.  Patient denies having depressed mood, issues with anxiety. She reports that lately she has been sleeping a lot, she has not been eating much and does not feel hungry. Her energy is very low and her concentration is fair. She denies any suicidality, hopelessness, helplessness or passive suicidal ideation. She denies any homicidal ideation or auditory or visual hallucinations.  Currently she continues to complain of having numbness on her face and scalp that goes all the went onto her neck. This issue has been present for several months and started suddenly. She has had several visits and consultations by neurology.   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 There is no evidence of diffuse demyelinating disease". She had an EEG on August of this year it had shown no epileptic activity.   Other studies: Laboratory Studies 08/08/15 Paraneoplastic Panel: negative Anti-GQ1b: negative Anti-GD1b: negative Sensory Neuropathy Panel: negative  08/05/15 CSF Protein: 70 WBC:  2  08/04/15 Antibodies-Sm,RNP,Ro,La: normal ANA: negative B12: 832 Copper: 125 Zinc: 72 Folate: 11.4 SPEP: no monoclonal antibodies    Substance abuse history patient denies the use of any alcohol or illicit substances. She does not have any history of tobacco use  Past Psychiatric History: Patient denies any prior history of some psychiatric issues. She denies any history of self injury or suicidal attempts but she has never seen a therapist or a psychiatrist before  Risk to Self: Is patient at risk for suicide?: No Risk to Others:  no Prior Inpatient Therapy:  no Prior Outpatient Therapy:  no  Past Medical History:  Past Medical History:  Diagnosis Date  . Arthritis    joints, shoulder and knees  . Diverticulosis   . GERD (gastroesophageal reflux disease)   . Heart murmur    no doctor follows her for this  . Hypertension     Past Surgical History:  Procedure Laterality Date  . BILATERAL CARPAL TUNNEL RELEASE Bilateral   . DILATION AND CURETTAGE OF UTERUS    . FRACTURE SURGERY Left 2007   fractured left  hip  . HARDWARE REMOVAL Left 06/24/2015   Procedure: HARDWARE REMOVAL/ DHS;  Surgeon: Dereck Leep, MD;  Location: [facility] ORS;  Service: Orthopedics;  Laterality: Left;  . IRRIGATION AND DEBRIDEMENT ABSCESS Right 09/24/2015   Procedure: IRRIGATION AND DEBRIDEMENT OF RIGHT HIP ABSCESS;  Surgeon: Florene Glen, MD;  Location: [facility] ORS;  Service: General;  Laterality: Right;  . JOINT REPLACEMENT Right 08/2014   total  knee replacement  . KNEE ARTHROSCOPY Left   . ROTATOR CUFF REPAIR Right 2006   Dr. Mauri Pole   . TONSILLECTOMY    . TOTAL HIP ARTHROPLASTY Left 06/24/2015   Procedure: TOTAL HIP ARTHROPLASTY;  Surgeon: Dereck Leep, MD;  Location: [facility] ORS;  Service: Orthopedics;  Laterality: Left;  . TOTAL KNEE ARTHROPLASTY Right 08/25/2014   Dr. Margaretmary Eddy   Family History:  Family History  Problem Relation Age of Onset  . Diabetes Father   . Breast cancer Neg Hx     Family Psychiatric  History: Patient reports that her mother started suffering from depression when she went through menopause. She was hospitalized at the state [facility] in [facility]. Her daughter suffers from anxiety and takes Cymbalta. There is no history of suicide in her family. She has a brother who has passed away who used to abuse drugs.  Social History: Patient has been married twice. She has been with her current husband for 29 years. They get along well patient has one daughter and 2 stepchildren. She has 2 grandchildren and one stepgrandchild. She tells me her family is very supportive. She is usually with somebody always at home watching for her and helping her out. 2 of her family members were present during the interview. Patient is Darrick Meigs and she is very religious and prior to her hip fracture she was very involved in church and used to attend church 3 times a week. Patient denies any history of legal problems. As far as her education the patient completed 12th grade she was in regular classes. She said that she had some trouble with concentration and attention during the school but no other issues. History  Alcohol Use No     History  Drug Use No    Social History   Social History  . Marital status: Married    Spouse name: N/A  . Number of children: N/A  . Years of education: N/A   Social History Main Topics  . Smoking status: Never Smoker  . Smokeless tobacco: Never Used     Comment: no passive smoke in household  . Alcohol use No  . Drug use: No  . Sexual activity: Not Currently   Other Topics Concern  . None   Social History Narrative  . None    Allergies:   Allergies  Allergen Reactions  . Statins Other (See Comments)    Reaction:  Leg cramps   . Sulfa Antibiotics Nausea Only  . Other Rash    "leg cramps"  . Penicillins Rash and Other (See Comments)    Has patient had a PCN reaction causing immediate rash, facial/tongue/throat swelling,  SOB or lightheadedness with hypotension: No Has patient had a PCN reaction causing severe rash involving mucus membranes or skin necrosis: No Has patient had a PCN reaction that required hospitalization No Has patient had a PCN reaction occurring within the last 10 years: Yes If all of the above answers are "NO", then may proceed with Cephalosporin use.  . Tape Rash    Labs:  Results for orders placed or performed during the hospital encounter of 01/29/16 (from the past 48 hour(s))  Ethanol     Status: None   Collection Time: 01/29/16  8:38 AM  Result Value Ref Range   Alcohol, Ethyl (B) <5 <5 mg/dL    Comment:        LOWEST DETECTABLE LIMIT FOR SERUM ALCOHOL IS 5 mg/dL FOR MEDICAL PURPOSES ONLY   Protime-INR  Status: None   Collection Time: 01/29/16  8:38 AM  Result Value Ref Range   Prothrombin Time 12.9 11.4 - 15.2 seconds   INR 0.97   APTT     Status: Abnormal   Collection Time: 01/29/16  8:38 AM  Result Value Ref Range   aPTT 39 (H) 24 - 36 seconds    Comment:        IF BASELINE aPTT IS ELEVATED, SUGGEST PATIENT RISK ASSESSMENT BE USED TO DETERMINE APPROPRIATE ANTICOAGULANT THERAPY.   CBC     Status: Abnormal   Collection Time: 01/29/16  8:38 AM  Result Value Ref Range   WBC 10.3 3.6 - 11.0 K/uL   RBC 4.38 3.80 - 5.20 MIL/uL   Hemoglobin 13.0 12.0 - 16.0 g/dL   HCT 37.7 35.0 - 47.0 %   MCV 86.2 80.0 - 100.0 fL   MCH 29.8 26.0 - 34.0 pg   MCHC 34.6 32.0 - 36.0 g/dL   RDW 16.7 (H) 11.5 - 14.5 %   Platelets 490 (H) 150 - 440 K/uL  Differential     Status: Abnormal   Collection Time: 01/29/16  8:38 AM  Result Value Ref Range   Neutrophils Relative % 53 %   Neutro Abs 5.5 1.4 - 6.5 K/uL   Lymphocytes Relative 37 %   Lymphs Abs 3.8 (H) 1.0 - 3.6 K/uL   Monocytes Relative 6 %   Monocytes Absolute 0.6 0.2 - 0.9 K/uL   Eosinophils Relative 3 %   Eosinophils Absolute 0.3 0 - 0.7 K/uL   Basophils Relative 1 %   Basophils Absolute 0.1 0 - 0.1 K/uL  Comprehensive  metabolic panel     Status: Abnormal   Collection Time: 01/29/16  8:38 AM  Result Value Ref Range   Sodium 135 135 - 145 mmol/L   Potassium 4.2 3.5 - 5.1 mmol/L   Chloride 100 (L) 101 - 111 mmol/L   CO2 27 22 - 32 mmol/L   Glucose, Bld 102 (H) 65 - 99 mg/dL   BUN 11 6 - 20 mg/dL   Creatinine, Ser 0.69 0.44 - 1.00 mg/dL   Calcium 9.8 8.9 - 10.3 mg/dL   Total Protein 7.2 6.5 - 8.1 g/dL   Albumin 3.3 (L) 3.5 - 5.0 g/dL   AST 24 15 - 41 U/L   ALT 19 14 - 54 U/L   Alkaline Phosphatase 108 38 - 126 U/L   Total Bilirubin 0.4 0.3 - 1.2 mg/dL   GFR calc non Af Amer >60 >60 mL/min   GFR calc Af Amer >60 >60 mL/min    Comment: (NOTE) The eGFR has been calculated using the CKD EPI equation. This calculation has not been validated in all clinical situations. eGFR's persistently <60 mL/min signify possible Chronic Kidney Disease.    Anion gap 8 5 - 15  Lactic acid, plasma     Status: None   Collection Time: 01/29/16  1:37 PM  Result Value Ref Range   Lactic Acid, Venous 0.9 0.5 - 1.9 mmol/L  Troponin I     Status: None   Collection Time: 01/29/16  1:51 PM  Result Value Ref Range   Troponin I <0.03 <0.03 ng/mL  Culture, blood (routine x 2)     Status: None (Preliminary result)   Collection Time: 01/29/16  1:51 PM  Result Value Ref Range   Specimen Description BLOOD LEFTSM FA    Special Requests BOTTLES DRAWN AEROBIC AND ANAEROBIC ANA6CC AER [place]    Culture NO  GROWTH < 24 HOURS    Report Status PENDING   Culture, blood (routine x 2)     Status: None (Preliminary result)   Collection Time: 01/29/16  1:51 PM  Result Value Ref Range   Specimen Description BLOOD RIGHT SM AC    Special Requests BOTTLES DRAWN AEROBIC AND ANAEROBIC [place]    Culture NO GROWTH < 24 HOURS    Report Status PENDING   Urinalysis complete, with microscopic     Status: Abnormal   Collection Time: 01/29/16  4:13 PM  Result Value Ref Range   Color, Urine YELLOW (A) YELLOW   APPearance CLOUDY (A) CLEAR   Glucose, UA  NEGATIVE NEGATIVE mg/dL   Bilirubin Urine NEGATIVE NEGATIVE   Ketones, ur NEGATIVE NEGATIVE mg/dL   Specific Gravity, Urine 1.014 1.005 - 1.030   Hgb urine dipstick 1+ (A) NEGATIVE   pH 6.0 5.0 - 8.0   Protein, ur 30 (A) NEGATIVE mg/dL   Nitrite POSITIVE (A) NEGATIVE   Leukocytes, UA 3+ (A) NEGATIVE   RBC / HPF 6-30 0 - 5 RBC/hpf   WBC, UA TOO NUMEROUS TO COUNT 0 - 5 WBC/hpf   Bacteria, UA MANY (A) NONE SEEN   Squamous Epithelial / LPF NONE SEEN NONE SEEN   WBC Clumps PRESENT   MRSA PCR Screening     Status: Abnormal   Collection Time: 01/29/16  8:25 PM  Result Value Ref Range   MRSA by PCR POSITIVE (A) NEGATIVE    Comment:        The GeneXpert MRSA Assay (FDA approved for NASAL specimens only), is one component of a comprehensive MRSA colonization surveillance program. It is not intended to diagnose MRSA infection nor to guide or monitor treatment for MRSA infections. RESULT CALLED TO, READ BACK BY AND VERIFIED WITH: KASEY VAUGHANS WARD AT 2224 ON 01/29/16 RWW   TSH     Status: None   Collection Time: 01/29/16  9:09 PM  Result Value Ref Range   TSH 1.234 0.350 - 4.500 uIU/mL    Comment: Performed by a 3rd Generation assay with a functional sensitivity of <=0.01 uIU/mL.  Troponin I     Status: None   Collection Time: 01/29/16  9:09 PM  Result Value Ref Range   Troponin I <0.03 <0.03 ng/mL  Troponin I     Status: None   Collection Time: 01/30/16  2:20 AM  Result Value Ref Range   Troponin I <0.03 <0.03 ng/mL  Troponin I     Status: None   Collection Time: 01/30/16  7:13 AM  Result Value Ref Range   Troponin I <0.03 <0.03 ng/mL  Basic metabolic panel     Status: None   Collection Time: 01/30/16  7:13 AM  Result Value Ref Range   Sodium 135 135 - 145 mmol/L   Potassium 4.0 3.5 - 5.1 mmol/L   Chloride 103 101 - 111 mmol/L   CO2 26 22 - 32 mmol/L   Glucose, Bld 80 65 - 99 mg/dL   BUN 10 6 - 20 mg/dL   Creatinine, Ser 0.64 0.44 - 1.00 mg/dL   Calcium 9.1 8.9 - 10.3  mg/dL   GFR calc non Af Amer >60 >60 mL/min   GFR calc Af Amer >60 >60 mL/min    Comment: (NOTE) The eGFR has been calculated using the CKD EPI equation. This calculation has not been validated in all clinical situations. eGFR's persistently <60 mL/min signify possible Chronic Kidney Disease.    Anion gap  6 5 - 15  CBC     Status: Abnormal   Collection Time: 01/30/16  7:13 AM  Result Value Ref Range   WBC 8.7 3.6 - 11.0 K/uL   RBC 4.03 3.80 - 5.20 MIL/uL   Hemoglobin 12.0 12.0 - 16.0 g/dL   HCT 35.2 35.0 - 47.0 %   MCV 87.1 80.0 - 100.0 fL   MCH 29.9 26.0 - 34.0 pg   MCHC 34.3 32.0 - 36.0 g/dL   RDW 16.8 (H) 11.5 - 14.5 %   Platelets 371 150 - 440 K/uL  Glucose, capillary     Status: None   Collection Time: 01/30/16  7:40 AM  Result Value Ref Range   Glucose-Capillary 72 65 - 99 mg/dL    Current Facility-Administered Medications  Medication Dose Route Frequency Provider Last Rate Last Dose  . 0.9 %  sodium chloride infusion   Intravenous Continuous Theodoro Grist, MD 75 mL/hr at 01/30/16 0138    . acetaminophen (TYLENOL) tablet 650 mg  650 mg Oral Q6H PRN Theodoro Grist, MD   650 mg at 01/30/16 0011   Or  . acetaminophen (TYLENOL) suppository 650 mg  650 mg Rectal Q6H PRN Theodoro Grist, MD      . acetic acid-hydrocortisone(VOSOL-HC) otic solution 2-1%  3 drop Left Ear TID Theodoro Grist, MD      . amLODipine (NORVASC) tablet 5 mg  5 mg Oral Daily Theodoro Grist, MD   5 mg at 01/30/16 1106  . aspirin EC tablet 81 mg  81 mg Oral Hulen Skains, MD   81 mg at 01/30/16 0553  . Chlorhexidine Gluconate Cloth 2 % PADS 6 each  6 each Topical Q0600 Theodoro Grist, MD   6 each at 01/30/16 0600  . docusate sodium (COLACE) capsule 100 mg  100 mg Oral BID Theodoro Grist, MD   100 mg at 01/30/16 0814  . enoxaparin (LOVENOX) injection 40 mg  40 mg Subcutaneous Q24H Theodoro Grist, MD   40 mg at 01/29/16 2122  . famotidine (PEPCID) tablet 20 mg  20 mg Oral Daily Theodoro Grist, MD   20 mg at  01/30/16 0821  . levETIRAcetam (KEPPRA) tablet 750 mg  750 mg Oral BID Theodoro Grist, MD   750 mg at 01/30/16 0814  . levofloxacin (LEVAQUIN) tablet 500 mg  500 mg Oral Daily Sheema M Hallaji, RPH      . LORazepam (ATIVAN) tablet 0.25 mg  0.25 mg Oral Daily PRN Theodoro Grist, MD      . MEDLINE mouth rinse  15 mL Mouth Rinse BID Theodoro Grist, MD   15 mL at 01/29/16 2122  . mirtazapine (REMERON SOL-TAB) disintegrating tablet 15 mg  15 mg Oral QHS Theodoro Grist, MD      . morphine 2 MG/ML injection 1 mg  1 mg Intravenous Q4H PRN Harrie Foreman, MD   1 mg at 01/30/16 0814  . mupirocin ointment (BACTROBAN) 2 % 1 application  1 application Nasal BID Theodoro Grist, MD   1 application at 63/33/54 0827  . nystatin (MYCOSTATIN) 100000 UNIT/ML suspension 500,000 Units  5 mL Oral QID Theodoro Grist, MD      . nystatin cream (MYCOSTATIN) 1 application  1 application Topical BID PRN Theodoro Grist, MD      . ondansetron (ZOFRAN) tablet 4 mg  4 mg Oral Q6H PRN Theodoro Grist, MD       Or  . ondansetron (ZOFRAN) injection 4 mg  4 mg Intravenous Q6H PRN Rima  Ether Griffins, MD      . ondansetron (ZOFRAN-ODT) disintegrating tablet 8 mg  8 mg Oral Q8H PRN Theodoro Grist, MD      . oxyCODONE-acetaminophen (PERCOCET/ROXICET) 5-325 MG per tablet 1 tablet  1 tablet Oral Q6H PRN Harrie Foreman, MD      . pantoprazole (PROTONIX) EC tablet 40 mg  40 mg Oral Daily Theodoro Grist, MD   40 mg at 01/30/16 0814  . polyethylene glycol (MIRALAX / GLYCOLAX) packet 17 g  17 g Oral Daily PRN Theodoro Grist, MD      . potassium chloride SA (K-DUR,KLOR-CON) CR tablet 20 mEq  20 mEq Oral QHS Theodoro Grist, MD   20 mEq at 01/29/16 2122  . vitamin B-12 (CYANOCOBALAMIN) tablet 1,000 mcg  1,000 mcg Oral XF-G1W Theodoro Grist, MD      . Derrill Memo ON 02/07/2016] Vitamin D (Ergocalciferol) (DRISDOL) capsule 50,000 Units  50,000 Units Oral Q7 days Theodoro Grist, MD        Musculoskeletal: Strength & Muscle Tone: decreased Gait & Station: unable to  stand Patient leans: N/A  Psychiatric Specialty Exam: Physical Exam  Constitutional: She is oriented to person, place, and time. She appears well-developed and well-nourished.  HENT:  Head: Normocephalic and atraumatic.  Eyes: Conjunctivae and EOM are normal.  Neck: Normal range of motion.  Respiratory: Effort normal.  Neurological: She is alert and oriented to person, place, and time.    Review of Systems  HENT: Negative.   Eyes: Negative.   Respiratory: Negative.   Cardiovascular: Negative.   Gastrointestinal: Negative.   Genitourinary: Negative.   Musculoskeletal: Positive for back pain.  Skin: Negative.   Neurological: Positive for dizziness, sensory change, speech change, seizures and weakness.  Endo/Heme/Allergies: Negative.   Psychiatric/Behavioral: Negative for depression, hallucinations, memory loss, substance abuse and suicidal ideas. The patient has insomnia. The patient is not nervous/anxious.     Blood pressure 121/71, pulse 100, temperature 97.5 F (36.4 C), temperature source Oral, resp. rate 16, height 5' 2"  (1.575 m), weight 54.4 kg (120 lb), SpO2 92 %.Body mass index is 21.95 kg/m.  General Appearance: Fairly Groomed  Eye Contact:  Good  Speech:  Clear and Coherent  Volume:  Normal  Mood:  Euthymic  Affect:  Appropriate  Thought Process:  Linear and Descriptions of Associations: Intact  Orientation:  Full (Time, Place, and Person)  Thought Content:  Hallucinations: None  Suicidal Thoughts:  No  Homicidal Thoughts:  No  Memory:  Immediate;   Fair Recent;   Good Remote;   Good  Judgement:  Good  Insight:  Good  Psychomotor Activity:  Decreased  Concentration:  Concentration: Good and Attention Span: Good  Recall:  Good  Fund of Knowledge:  Good  Language:  Good  Akathisia:  No  Handed:    AIMS (if indicated):     Assets:  Agricultural consultant Housing Intimacy Resilience Social Support  ADL's:  Impaired   Cognition:  WNL  Sleep:        Treatment Plan Summary:  This is a 70 year old Caucasian female with no known prior psychiatric history who has suffered a multitude of medical complications in worsening of her health since March of this year. The patient has required multiple hospitalizations and even admissions to intensive care in intubations. Despite all of this as stressors the patient denies depression.  She however has developed some neurological symptoms that have been difficult to explain based on lengthy neurological workup.  There is the probability that  some of these symptoms (numbness) could be psychosomatic.  However it is also necessary to consider the potential neurotoxicity from exposure to antibiotics.  And I believe her current weakness could be explained by her multiple and lengthy hospitalization that are likely to have caused deconditioning.  Most likely this patient is suffering from depressive disorder secondary to her lengthy list of medical conditions. The patient reported having decreased energy poor appetite and hypersomnia.  Hospitalist has started her on mirtazapine 15 mg by mouth daily at bedtime which I think is appropriate choice as the patient has lost a significant amount of weight and has suffered from decreased appetite.  For neurological symptoms and depression we might consider starting Cymbalta. The patient's daughter takes this medication for her anxiety has been very helpful for her.  I have also d/c benadryl, meclizine, claritinand bentyl  as all of these anticholinergic medications can worsen cognition, cause urinary retention and cause confusing  Psychiatry will continue to follow the patient  I have reviewed records from prior hospitalization at [facility]. I have reviewed records from [facility] where she sees Dr. Darrick Grinder neurologist. I have reviewed results from MRIs, EEG from prior encounters. Have obtained collateral information from the  patient's husband and her daughter.  Hildred Priest, MD 01/30/2016 1:30 PM

## 2016-01-30 NOTE — Care Management Note (Addendum)
Case Management Note  Patient Details  Name: Nancy York MRN: [id number] Date of Birth: [birth year]  Subjective/Objective:    70yo Mrs Nancy SchaumannSandra York was admitted from home per sepsis from UTI. Resides with husband. Has used [facility] until recently for home health. PCP=Dr Sparks. Pharmacy=[facility]. All DME at home including a wheelchair. C/o whole body numbness which is also interfering with ability to eat. Psych consult pending. Anticipate discharge home with home health again.                 Action/Plan:   Expected Discharge Date:                  Expected Discharge Plan:     In-House Referral:     Discharge planning Services     Post Acute Care Choice:    Choice offered to:     DME Arranged:    DME Agency:     HH Arranged:    HH Agency:     Status of Service:     If discussed at [employer] Length of Stay Meetings, dates discussed:    Additional Comments:  Nancy York A, RN 01/30/2016, 9:16 AM

## 2016-01-31 LAB — URINE CULTURE: Culture: >=100000 — AB

## 2016-01-31 LAB — GLUCOSE, CAPILLARY: Glucose-Capillary: 84 mg/dL (ref 65–99)

## 2016-01-31 MED ORDER — ENSURE ENLIVE PO LIQD
237.0000 mL | Freq: Two times a day (BID) | ORAL | Status: DC
  Administered 2016-01-31: 12:00:00 237 mL via ORAL

## 2016-01-31 NOTE — Progress Notes (Signed)
[place] [place] NAME: Nancy York    MR#:  [id number]  DATE OF BIRTH:  25-Dec-1945  SUBJECTIVE:   Pt. Here due to lower back pain, difficulty walking.  Noted to have a UTI.  Has had multitude of neurological complaints and an extensive workup at [facility] which was negative.  Family at bedside today. PO intake poor.  Feels about the same.  PT recommending SNF.    REVIEW OF SYSTEMS:    Review of Systems  Constitutional: Negative for chills and fever.  HENT: Negative for congestion and tinnitus.   Eyes: Negative for blurred vision and double vision.  Respiratory: Negative for cough, shortness of breath and wheezing.   Cardiovascular: Negative for chest pain, orthopnea and PND.  Gastrointestinal: Negative for abdominal pain, diarrhea, nausea and vomiting.  Genitourinary: Negative for dysuria and hematuria.  Musculoskeletal: Positive for back pain.  Neurological: Positive for focal weakness (Lower ext. weakness. ) and weakness. Negative for dizziness and sensory change.  All other systems reviewed and are negative.   Nutrition: dysphagia II with nectar thick liquid Tolerating Diet: Yes Tolerating PT: Eval noted.   DRUG ALLERGIES:   Allergies  Allergen Reactions  . Statins Other (See Comments)    Reaction:  Leg cramps   . Sulfa Antibiotics Nausea Only  . Other Rash    "leg cramps"  . Penicillins Rash and Other (See Comments)    Has patient had a PCN reaction causing immediate rash, facial/tongue/throat swelling, SOB or lightheadedness with hypotension: No Has patient had a PCN reaction causing severe rash involving mucus membranes or skin necrosis: No Has patient had a PCN reaction that required hospitalization No Has patient had a PCN reaction occurring within the last 10 years: Yes If all of the above answers are "NO", then may proceed with Cephalosporin use.  . Tape Rash    VITALS:  Blood pressure (!) 146/71, pulse  69, temperature 98.5 F (36.9 C), resp. rate 18, height 5' 2"  (1.575 m), weight 54.9 kg (121 lb), SpO2 99 %.  PHYSICAL EXAMINATION:   Physical Exam  GENERAL:  70 y.o.-year-old patient lying in the bed with flat affect but in NAD.   EYES: Pupils equal, round, reactive to light and accommodation. No scleral icterus. Extraocular muscles intact.  HEENT: Head atraumatic, normocephalic. Oropharynx and nasopharynx clear.  NECK:  Supple, no jugular venous distention. No thyroid enlargement, no tenderness.  LUNGS: Normal breath sounds bilaterally, no wheezing, rales, rhonchi. No use of accessory muscles of respiration.  CARDIOVASCULAR: S1, S2 normal. No murmurs, rubs, or gallops.  ABDOMEN: Soft, nontender, nondistended. Bowel sounds present. No organomegaly or mass.  EXTREMITIES: No cyanosis, clubbing or edema b/l.    NEUROLOGIC: Cranial nerves II through XII are intact. No focal Motor or sensory deficits b/l.  Globally weak.     PSYCHIATRIC: The patient is alert and oriented x 3. Flat affect.   SKIN: No obvious rash, lesion, or ulcer.    LABORATORY PANEL:   CBC  Recent Labs Lab 01/30/16 0713  WBC 8.7  HGB 12.0  HCT 35.2  PLT 371   ------------------------------------------------------------------------------------------------------------------  Chemistries   Recent Labs Lab 01/29/16 0838 01/30/16 0713  NA 135 135  K 4.2 4.0  CL 100* 103  CO2 27 26  GLUCOSE 102* 80  BUN 11 10  CREATININE 0.69 0.64  CALCIUM 9.8 9.1  AST 24  --   ALT 19  --   ALKPHOS 108  --  BILITOT 0.4  --    ------------------------------------------------------------------------------------------------------------------  Cardiac Enzymes  Recent Labs Lab 01/30/16 0713  TROPONINI <0.03   ------------------------------------------------------------------------------------------------------------------  RADIOLOGY:  Dg Lumbar Spine 2-3 Views  Result Date: 01/30/2016 CLINICAL DATA:  Initial  evaluation for acute lower back pain. EXAM: LUMBAR SPINE - 2-3 VIEW COMPARISON:  Comparison made with prior CT from 07/16/2015. FINDINGS: Vertebral bodies normally aligned with preservation of the normal lumbar lordosis. Compression deformities involving the T12, L2, and L3 vertebral bodies are stable. No acute fracture or subluxation. Multilevel degenerative spondylolysis grossly stable, most prevalent at L4-5. Visualized sacrum intact. Left total hip arthroplasty partially visualized. SI joints approximated. Bladder is distended with secrete IV contrast material within the bladder lumen. Soft tissues otherwise unremarkable. Aortic atherosclerosis noted. Osteopenia noted. IMPRESSION: 1. No radiographic evidence for acute abnormality within the lumbar spine. 2. Stable compression deformities involving the T12, L2, and L3 vertebral bodies. 3. Osteopenia. 4. Aortic atherosclerosis. Electronically Signed   By: Jeannine Boga M.D.   On: 01/30/2016 18:25   Ct Angio Chest Pe W Or Wo Contrast  Result Date: 01/29/2016 CLINICAL DATA:  Numbness in the legs off and on but worse today. Recent hospitalization for pneumonia. Back pain. Acute respiratory failure with hypoxia. EXAM: CT ANGIOGRAPHY CHEST WITH CONTRAST TECHNIQUE: Multidetector CT imaging of the chest was performed using the standard protocol during bolus administration of intravenous contrast. Multiplanar CT image reconstructions and MIPs were obtained to evaluate the vascular anatomy. CONTRAST:  75 mL Isovue 370 COMPARISON:  12/26/2015 FINDINGS: Cardiovascular: Technically adequate study with good opacification of the central and segmental pulmonary arteries. No focal filling defects demonstrated. No evidence of significant pulmonary embolus. Normal heart size. Coronary artery and aortic calcifications. Normal caliber thoracic aorta. No aortic dissection. Great vessel origins are patent. Mediastinum/Nodes: Scattered lymph nodes in the mediastinum are not  pathologically enlarged. Esophagus is decompressed. Lungs/Pleura: Lungs are clear. No pleural effusion or pneumothorax. Upper Abdomen: No acute abnormality. Musculoskeletal: Multiple vertebral compression fractures, including T5, T9, and L1. The T5 and T9 fractures are new since the previous study, suggesting acute compression. This likely indicates osteoporotic fractures. No retropulsion of fracture fragments. T5 demonstrates about 50% compression and T9 demonstrates about 75% compression. Review of the MIP images confirms the above findings. IMPRESSION: No evidence of significant pulmonary embolus. No evidence of active pulmonary disease. Fractures of the T9 and T5 vertebral bodies, new since previous study of 12/26/2015 suggesting acute fractures, likely osteoporotic. Electronically Signed   By: Lucienne Capers M.D.   On: 01/29/2016 18:59   Dg Chest Port 1 View  Result Date: 01/29/2016 CLINICAL DATA:  Weakness and numbness in legs. EXAM: PORTABLE CHEST 1 VIEW COMPARISON:  12/26/2015 CT chest FINDINGS: The heart size and mediastinal contours are within normal limits. Atherosclerosis of the aorta. Both lungs are clear. No pleural effusion or pneumothorax. 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 IMPRESSION: No acute pulmonary disease.  Aortic atherosclerosis. Electronically Signed   By: Ashley Royalty M.D.   On: 01/29/2016 15:32     ASSESSMENT AND PLAN:   70 year old female with past medical history of GERD, hypertension, diverticulosis, osteoarthritis, who presented to the hospital yesterday due to weakness, difficulty eating, weight loss, intermittent nausea vomiting and feeling dizzy. She was noted to have urinary tract infection.  1. Sepsis-patient met sepsis criteria given her hypothermia, tachycardia on admission along with abnormal urinalysis. Should source is the urinary tract infection. Urine cultures positive for Citrobacter. -Continue Levaquin. Currently afebrile and  hemodynamically stable.  2. Urinary tract infection-source of patient's sepsis.  - cont. Levaquin.  Urine cultures + for Citrobacter and it's sensitive to it.   3. Difficulty ambulating, numbness, difficulty eating-patient has a conglomerate of neurological complaints and has had an extensive workup at [facility] and at [facility] which has been essentially benign.  -She's had a negative MRI head, negative EMG study.  - seen by PT and they recommend SNF and will make Social work aware.  Await Speech eval.  -I will defer further work up as outpatient w/ Neurology.   4. Back Pain - obtained Lumbar X-ray yesterday showing stable compression fractures but no other acute abnormality.  Cont. Supportive care.   5. Hx of Seizures - cont. Keppra  6. GERD - cont. Protonix.   7. Essential HTN - cont. Norvasc.   7. Depression - ?? Neurologic complaints due to underlying psych illness.  - appreciate Psych eval and started on Cymbalta and will cont. To monitor.   All the records are reviewed and case discussed with Care Management/Social Worker. Management plans discussed with the patient, family and they are in agreement.  CODE STATUS: Full code  DVT Prophylaxis: Lovenox  TOTAL TIME TAKING CARE OF THIS PATIENT: 30 minutes.   POSSIBLE D/C IN 1-2 DAYS, DEPENDING ON CLINICAL CONDITION.   Henreitta Leber M.D on 01/31/2016 at 11:37 AM  Between 7am to 6pm - Pager - [phone]  After 6pm go to [email or web address] - Proofreader  [facility]  Office  [phone]  CC: Primary care physician; Idelle Crouch, MD

## 2016-01-31 NOTE — Progress Notes (Signed)
Initial Nutrition Assessment  DOCUMENTATION CODES:   Severe malnutrition in context of chronic illness  INTERVENTION:  -Ensure Enlive po BID, each supplement provides 350 kcal and 20 grams of protein -Continue NDD2- nectar thick  NUTRITION DIAGNOSIS:   Malnutrition related to chronic illness as evidenced by severe depletion of muscle mass, severe depletion of body fat, percent weight loss.  GOAL:   Patient will meet greater than or equal to 90% of their needs  MONITOR:   PO intake, I & O's, Weight trends, Labs, Supplement acceptance  REASON FOR ASSESSMENT:   Malnutrition Screening Tool    ASSESSMENT:   Nancy York  is a 70 y.o. female with a known history of Heart murmur, hypertension, arthritis, diverticulosis, gastroesophageal reflux disease, recent hip surgery, femur fracture, who was in and out from placement, comes back to the hospital with weakness, inability to eat, weight loss, intermittent nausea, vomiting, feeling dizzy.  [name], Husband at bedside, endorses good appetite but severe wt loss since her hospitalization at [facility]. Per chart review pt exhibits a 24#/16.5% severe wt loss over 1 month. She continues to have some spurts of nausea/vomiting, has not consumed much since she has been here. She also has significant back pain that makes it difficult for her to care for herself in addition to her previous hip fracture. Nutrition-Focused physical exam completed. Findings are severe fat depletion, severe muscle depletion, and no edema.  Labs and medications reviewed.  Diet Order:  DIET DYS 2 Room service appropriate? Yes; Fluid consistency: Nectar Thick  Skin:     Last BM:  01/28/2016  Height:   Ht Readings from Last 1 Encounters:  01/29/16 5\' 2"  (1.575 m)    Weight:   Wt Readings from Last 1 Encounters:  01/31/16 121 lb (54.9 kg)    Ideal Body Weight:  50 kg  BMI:  Body mass index is 22.13 kg/m.  Estimated Nutritional Needs:   Kcal:   1375-1600 calories  Protein:  55-70 gm  Fluid:  >/= 1.3L  EDUCATION NEEDS:   No education needs identified at this time  Dionne AnoWilliam M. Curtez Brallier, MS, RD LDN Inpatient Clinical Dietitian Pager [phone]

## 2016-01-31 NOTE — Care Management Important Message (Signed)
Important Message  Patient Details  Name: Nancy BlakeSandra S Brisbin MRN: [id number] Date of Birth: 23-Dec-1945   Medicare Important Message Given:  Yes    Dennie Moltz A, RN 01/31/2016, 12:52 PM

## 2016-01-31 NOTE — Progress Notes (Signed)
Notified Dr Cherlynn KaiserSainani that pt's daughter is concerned that pt is too sleepy today and have poor appetite. To note, pt is sleepy, but easily aroused. Pt started yesterday on cymbalta and mirtazapine. Also notified MD that per daughter pt no longer takes ativan prn and requests to d/c ativan. Per MD to hold tonight 01/31/16 mirtazapine, to give cymbalta and to d/c ativan.

## 2016-01-31 NOTE — Plan of Care (Signed)
Problem: Activity: Goal: Risk for activity intolerance will decrease Outcome: Not Progressing Pt sleeping throughout the shift, but easily aroused. Poor appetite. Pt encourage to eat and drink. Ensure given, but pt had only few sips. Vomited once. Nausea med given once with improvement.

## 2016-02-01 ENCOUNTER — Inpatient Hospital Stay: Payer: Commercial Managed Care - HMO

## 2016-02-01 DIAGNOSIS — F450 Somatization disorder: Secondary | ICD-10-CM

## 2016-02-01 DIAGNOSIS — R531 Weakness: Secondary | ICD-10-CM

## 2016-02-01 LAB — BASIC METABOLIC PANEL
ANION GAP: 4 — AB (ref 5–15)
BUN: 7 mg/dL (ref 6–20)
CHLORIDE: 104 mmol/L (ref 101–111)
CO2: 29 mmol/L (ref 22–32)
CREATININE: 0.43 mg/dL — AB (ref 0.44–1.00)
Calcium: 9.4 mg/dL (ref 8.9–10.3)
GFR calc Af Amer: >60 mL/min (ref >60)
GFR calc non Af Amer: >60 mL/min (ref >60)
Glucose, Bld: 97 mg/dL (ref 65–99)
POTASSIUM: 4.1 mmol/L (ref 3.5–5.1)
SODIUM: 137 mmol/L (ref 135–145)

## 2016-02-01 LAB — CBC
HCT: 34.1 % — ABNORMAL LOW (ref 35.0–47.0)
HEMOGLOBIN: 11.6 g/dL — AB (ref 12.0–16.0)
MCH: 29.5 pg (ref 26.0–34.0)
MCHC: 33.9 g/dL (ref 32.0–36.0)
MCV: 86.9 fL (ref 80.0–100.0)
Platelets: 312 10*3/uL (ref 150–440)
RBC: 3.93 MIL/uL (ref 3.80–5.20)
RDW: 16.1 % — ABNORMAL HIGH (ref 11.5–14.5)
WBC: 9.8 10*3/uL (ref 3.6–11.0)

## 2016-02-01 LAB — C-REACTIVE PROTEIN: CRP: 6 mg/dL — AB (ref <1.0)

## 2016-02-01 LAB — GLUCOSE, CAPILLARY: GLUCOSE-CAPILLARY: 86 mg/dL (ref 65–99)

## 2016-02-01 LAB — SEDIMENTATION RATE: Sed Rate: 55 mm/hr — ABNORMAL HIGH (ref 0–30)

## 2016-02-01 MED ORDER — FAMOTIDINE 20 MG PO TABS: 20.0000 mg | ORAL_TABLET | Freq: Every day | ORAL | Status: DC | PRN

## 2016-02-01 NOTE — Progress Notes (Signed)
Encouraged patient to eat, po intake patient said she does not like ensure, contacted dietary will order mighty shake.

## 2016-02-01 NOTE — Clinical Social Work Note (Signed)
Clinical Social Work Assessment  Patient Details  Name: Nancy York MRN: [id number] Date of Birth: 18-Jun-1945  Date of referral:  02/01/16               Reason for consult:  Discharge Planning                Permission sought to share information with:  Family Supports Permission granted to share information::  Yes, Verbal Permission Granted  Name::        Agency::     Relationship::   (Freddy- Husband & [place] Sinkita- Daughter)  Contact Information:     Housing/Transportation Living arrangements for the past 2 months:  Single Family Home Source of Information:  Patient, Spouse, Adult Children Patient Interpreter Needed:  None Criminal Activity/Legal Involvement Pertinent to Current Situation/Hospitalization:  No - Comment as needed Significant Relationships:  Adult Children, Other Family Members, Friend, Spouse Lives with:  Spouse Do you feel safe going back to the place where you live?    Need for family participation in patient care:  Yes (Comment) (Freddy- Husband & Cloud SinkRita- Daughter)  Care giving concerns:  PT recommends that patient would benefit from SNF   Social Worker assessment / plan:  CSW spoke to patient's daughter over the phone. Explained herself and her role. CSW explained to patient's daughter that PT recommended STR. Patient's daughter inquired about CIR. CSW informed her that PT did not recommend CIR and that insurance will not pay for that service without at PT recommendation. Per daughter if patient has to go to SNF for STR she [facility]. Granted CSW verbal permission to send SNF referral to SNFs in [place]. FL2/ PASRR completed and faxed to SNFs in [facility] [place]. Awaiting bed offers.  CSW spoke to patient's husband. Patient was asleep in bed. Presented bed offers. Patient's husband requested CSW follow up with [facility] to determine if they are able to accept patient. Stated she's been there in the past. CSW contacted Marcelino DusterMichelle- [facility] to  determine if they can accept patient. Awaiting phone call back.   CSW will begin [place] auth. when patient and family chooses a bed.   Employment status:  Retired Database administratornsurance information:  Managed Medicare PT Recommendations:  Skilled Nursing Facility Information / Referral to community resources:  Skilled Nursing Facility  Patient/Family's Response to care:  Patient and her family are in agreement for patient to go to SNF at discharge.   Patient/Family's Understanding of and Emotional Response to Diagnosis, Current Treatment, and Prognosis:  Patient reports that somewhat understand. Thanked CSW for assistance.   Emotional Assessment Appearance:  Appears stated age Attitude/Demeanor/Rapport:   (None) Affect (typically observed):  Calm Orientation:  Oriented to Self Alcohol / Substance use:  Not Applicable Psych involvement (Current and /or in the community):  No (Comment)  Discharge Needs  Concerns to be addressed:  Discharge Planning Concerns Readmission within the last 30 days:  No Current discharge risk:  Chronically ill Barriers to Discharge:  Continued Medical Work up   WalgreenChristina E Oaklee Sunga, LCSW 02/01/2016, 3:03 PM

## 2016-02-01 NOTE — Plan of Care (Signed)
Problem: SLP Dysphagia Goals Goal: Misc Dysphagia Goal Pt will safely tolerate po diet of least restrictive consistency w/ no overt s/s of aspiration noted by Staff/pt/family x3 sessions.    

## 2016-02-01 NOTE — Progress Notes (Signed)
CSW attempted to contact patient's husband and daughter by the numbers listed on the facesheet. No answer. Left voicemail. Awaiting phone call back.  Woodroe Modehristina Jasai Sorg, MSW, LCSW, LCAS-A Clinical Social Worker [phone]

## 2016-02-01 NOTE — [place] FL2 (Signed)
MEDICAID FL2 LEVEL OF CARE SCREENING TOOL     IDENTIFICATION  Patient Name: Nancy York Birthdate: Jun 28, 1945 Sex: female Admission Date (Current Location): 01/29/2016  [place] and [place] Number:  Chiropodist and Address:  [facility], [address] [place], [place] [address]      Provider Number: [id number]  Attending Physician Name and Address:  Houston Siren, MD  Relative Name and Phone Number:       Current Level of Care: Hospital Recommended Level of Care: Skilled Nursing Facility Prior Approval Number:    Date Approved/Denied:   PASRR Number:  [id number])  Discharge Plan: SNF    Current Diagnoses: Patient Active Problem List   Diagnosis Date Noted  . 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 Sepsis (HCC) 01/29/2016  . Hypothermia 01/29/2016  . UTI (urinary tract infection) 01/29/2016  . Hypercapnia 01/29/2016  . Disease of thyroid gland 10/01/2015  . HLD (hyperlipidemia) 10/01/2015  . Pressure ulcer 09/24/2015  . Moderate protein-energy malnutrition (HCC) 08/10/2015  . Neuritis 08/05/2015  . Facial numbness 07/25/2015  . Generalized weakness 07/18/2015  . GERD (gastroesophageal reflux disease) 07/18/2015  . HTN (hypertension) 07/18/2015  . B12 deficiency 08/04/2014    Orientation RESPIRATION BLADDER Height & Weight     Self, Time, Situation, Place  Normal Incontinent Weight: 118 lb 4.8 oz (53.7 kg) Height:  5\' 2"  (157.5 cm)  BEHAVIORAL SYMPTOMS/MOOD NEUROLOGICAL BOWEL NUTRITION STATUS   (None)  (None) Incontinent Diet (DYS 2 Fluid consistency: Nectar Thick)  AMBULATORY STATUS COMMUNICATION OF NEEDS Skin   Extensive Assist Verbally Other (Comment) (Other- Elbow Bilateral- Red)                       Personal Care Assistance Level of Assistance  Bathing, Feeding, Dressing Bathing Assistance:  Limited assistance Feeding assistance: Limited assistance Dressing Assistance: Limited assistance     Functional Limitations Info  Speech, Hearing, Sight Sight Info: Adequate Hearing Info: Adequate Speech Info: Adequate    SPECIAL CARE FACTORS FREQUENCY  PT (By licensed PT)     PT Frequency:  (5)              Contractures      Additional Factors Info  Code Status, Allergies, Isolation Precautions Code Status Info:  (Full Code) Allergies Info:  (Statins, Sulfa Antibiotics, Other, Penicillins, Tape)     Isolation Precautions Info:  (Contacat Precautions- MRSA)     Current Medications (02/01/2016):  This is the current hospital active medication list Current Facility-Administered Medications  Medication Dose Route Frequency Provider Last Rate Last Dose  . acetaminophen (TYLENOL) tablet 650 mg  650 mg Oral Q6H PRN Katharina Caper, MD   650 mg at 01/30/16 0011   Or  . acetaminophen (TYLENOL) suppository 650 mg  650 mg Rectal Q6H PRN Katharina Caper, MD      . acetic acid-hydrocortisone(VOSOL-HC) otic solution 2-1%  3 drop Left Ear TID Katharina Caper, MD   3 drop at 01/31/16 2200  . amLODipine (NORVASC) tablet 5 mg  5 mg Oral Daily Katharina Caper, MD   5 mg at 02/01/16 0858  . aspirin EC tablet 81 mg  81 mg Oral Reita Chard, MD   81 mg at 01/31/16 0626  . Chlorhexidine Gluconate Cloth 2 % PADS 6 each  6 each Topical Q0600 Katharina Caper, MD   6 each at 02/01/16 0600  .  docusate sodium (COLACE) capsule 100 mg  100 mg Oral BID Katharina Caperima Vaickute, MD   100 mg at 02/01/16 0905  . DULoxetine (CYMBALTA) DR capsule 30 mg  30 mg Oral Daily Jimmy FootmanAndrea Hernandez-Gonzalez, MD   30 mg at 02/01/16 0905  . enoxaparin (LOVENOX) injection 40 mg  40 mg Subcutaneous Q24H Katharina Caperima Vaickute, MD   40 mg at 01/31/16 2143  . famotidine (PEPCID) tablet 20 mg  20 mg Oral Daily Katharina Caperima Vaickute, MD   20 mg at 02/01/16 0905  . levETIRAcetam (KEPPRA) tablet 750 mg  750 mg Oral BID Katharina Caperima Vaickute, MD   750 mg at  02/01/16 0905  . levofloxacin (LEVAQUIN) tablet 500 mg  500 mg Oral Daily Sheema M Hallaji, RPH   500 mg at 01/31/16 1744  . MEDLINE mouth rinse  15 mL Mouth Rinse BID Katharina Caperima Vaickute, MD   15 mL at 01/31/16 2200  . mirtazapine (REMERON SOL-TAB) disintegrating tablet 15 mg  15 mg Oral QHS Katharina Caperima Vaickute, MD   Stopped at 01/31/16 2200  . morphine 2 MG/ML injection 1 mg  1 mg Intravenous Q4H PRN Arnaldo NatalMichael S Diamond, MD   1 mg at 01/31/16 2358  . mupirocin ointment (BACTROBAN) 2 % 1 application  1 application Nasal BID Katharina Caperima Vaickute, MD   1 application at 01/31/16 2143  . nystatin (MYCOSTATIN) 100000 UNIT/ML suspension 500,000 Units  5 mL Oral QID Katharina Caperima Vaickute, MD   500,000 Units at 02/01/16 0911  . nystatin cream (MYCOSTATIN) 1 application  1 application Topical BID PRN Katharina Caperima Vaickute, MD   1 application at 01/31/16 2142  . ondansetron (ZOFRAN) tablet 4 mg  4 mg Oral Q6H PRN Katharina Caperima Vaickute, MD       Or  . ondansetron (ZOFRAN) injection 4 mg  4 mg Intravenous Q6H PRN Katharina Caperima Vaickute, MD   4 mg at 02/01/16 0906  . ondansetron (ZOFRAN-ODT) disintegrating tablet 8 mg  8 mg Oral Q8H PRN Katharina Caperima Vaickute, MD      . oxyCODONE-acetaminophen (PERCOCET/ROXICET) 5-325 MG per tablet 1 tablet  1 tablet Oral Q6H PRN Arnaldo NatalMichael S Diamond, MD   1 tablet at 01/30/16 1954  . pantoprazole (PROTONIX) EC tablet 40 mg  40 mg Oral Daily Katharina Caperima Vaickute, MD   40 mg at 02/01/16 0905  . polyethylene glycol (MIRALAX / GLYCOLAX) packet 17 g  17 g Oral Daily PRN Katharina Caperima Vaickute, MD      . potassium chloride SA (K-DUR,KLOR-CON) CR tablet 20 mEq  20 mEq Oral QHS Katharina Caperima Vaickute, MD   20 mEq at 01/31/16 2144  . vitamin B-12 (CYANOCOBALAMIN) tablet 1,000 mcg  1,000 mcg Oral Reita ChardBH-q7a Rima Vaickute, MD   1,000 mcg at 02/01/16 0905  . [START ON 02/07/2016] Vitamin D (Ergocalciferol) (DRISDOL) capsule 50,000 Units  50,000 Units Oral Q7 days Katharina Caperima Vaickute, MD         Discharge Medications: Please see discharge summary for a list of discharge  medications.  Relevant Imaging Results:  Relevant Lab Results:   Additional Information SSN [id number]  Verta EllenChristina E Tamico Mundo, LCSW

## 2016-02-01 NOTE — Progress Notes (Signed)
Physical Therapy Treatment Patient Details Name: Nancy BlakeSandra S Tulloch MRN: [id number] DOB: Jun 06, 1945 Today's Date: 02/01/2016    History of Present Illness Pt admitted for sepsis. Pt with history of acute T5 and T9 compression fractures. Pt with complaints of numbness and back pain. Pt with chronic history of heart murmur, HTN, GERD, and hip Sx in 06/2015.     PT Comments    Pt. Supine in bed upon arrival, states she would like to sit EOB in order to eat her breakfast. Pt. Demonstrates B UE strength 4/5 and BLE strength 2-/5 with isolated testing. She is able to assist with bed mobility through use of B UE to assist with BLE movement and with use of bedrails, pt. Requires mod A to transfer supine to sitting EOB requiring assist primarily for BLE and trunk support and positioning with +2 for safety. Pt. Able to perform a few minutes of sitting EOB with max -total A for trunk support, pt. Demonstrates posterior lean and inability to maintain sitting balance even when positioned within BOS and assisted with UE placement to aid in support. BP assessed with position change, supine BP 101/70 sitting EOB BP 83/52 pt. Reported dizziness, nausea, and feeling like "passing out" with position change, she was returned to supine max A x2. Pt. Required max x2 for bed positioning. Would benefit from skilled PT to address above deficits and promote optimal return to PLOF  Continue to recommend SNF placement upon d/c to follow up with further skilled PT needs.   Follow Up Recommendations  SNF     Equipment Recommendations       Recommendations for Other Services       Precautions / Restrictions Precautions Precautions: Fall Restrictions Weight Bearing Restrictions: No    Mobility  Bed Mobility Overal bed mobility: Needs Assistance Bed Mobility: Supine to Sit;Sit to Supine     Supine to sit: Mod assist;+2 for safety/equipment (Mod A x1 to position at EOB, pt. requires max A once seated ) Sit to supine:  Max assist;+2 for physical assistance   General bed mobility comments: Pt. able to assist with LE movement with use of UE's, provides UE assist with use of bedrails. Pt. requires mod A for LE and trunk support with transition from supine to sitting EOB. Max A x2 to return to supine   Transfers Overall transfer level:  (unsafe to perform today )                  Ambulation/Gait Ambulation/Gait assistance:  (unsafe to attempt)               Stairs            Wheelchair Mobility    Modified Rankin (Stroke Patients Only)       Balance Overall balance assessment: Needs assistance;History of Falls Sitting-balance support: Bilateral upper extremity supported;Feet supported Sitting balance-Leahy Scale: Zero Sitting balance - Comments: Pt. requires verbal cues and physical assit for BUE positioning to support in sitting EOB, pt. unable to maintain trunk stability sitting EOB.  Postural control: Posterior lean                          Cognition Arousal/Alertness: Awake/alert Behavior During Therapy: WFL for tasks assessed/performed Overall Cognitive Status: Within Functional Limits for tasks assessed                      Exercises      General Comments  Pertinent Vitals/Pain Pain Assessment: No/denies pain    Home Living                      Prior Function            PT Goals (current goals can now be found in the care plan section) Acute Rehab PT Goals Patient Stated Goal: to get stronger PT Goal Formulation: With patient Time For Goal Achievement: 02/13/16 Potential to Achieve Goals: Good Additional Goals Additional Goal #1: Pt will be able to perform bed mobility/transfers with cga and no buckling noted in order to improve functional independence Progress towards PT goals: Not progressing toward goals - comment (pt. unable to participate/tolerate mobility progression during today's session )    Frequency     Min 2X/week      PT Plan Current plan remains appropriate    Co-evaluation             End of Session   Activity Tolerance: Treatment limited secondary to medical complications (Comment) (Pt. positive for orthostasis with sitting EOB, symptomatic complaining of lightheadedness feeling like she would pass out as well as nausea) Patient left: in bed;with call bell/phone within reach;with bed alarm set     Time: 1610-9604 PT Time Calculation (min) (ACUTE ONLY): 14 min  Charges:                       G Codes:       Huntley Dec, SPT  02/01/16,4:44 PM

## 2016-02-01 NOTE — Plan of Care (Signed)
Problem: Physical Regulation: Goal: Signs and symptoms of infection will decrease Outcome: Progressing Patient had low body temp improved upon use of warming blanket as ordered    Problem: Safety: Goal: Ability to remain free from injury will improve Outcome: Progressing No falls this shift   Problem: Pain Managment: Goal: General experience of comfort will improve Outcome: Progressing Pain is controled with with prn Percocet   Problem: Activity: Goal: Risk for activity intolerance will decrease Outcome: Not Progressing Up to chair with tow person assist patients legs very week not supporting self

## 2016-02-01 NOTE — Consult Note (Signed)
Reason for Consult:Numbness Referring Physician: Verdell Carmine  CC: Numbness  HPI: Nancy York is an 70 y.o. female who presented to the ED on 01/29/2016 unable to ambulate.  Patient has had a constellation of symptoms since March of this year.  Family reports that patient had a cellulitis at that time, was diagnosed with hyponatremia and has gone downhill since that time.  Complaints consist of initially numbness around the mouth and difficulty swallowing.  Patient now has numbness over her entire body, difficulty swallowing, hypothermia, dysarthria, weakness in her lower extremities and urinary retention.  Has had evaluations by urology, neurology and ENT.  Work ups have included multiple MRI's of the brain, MRI of the cervical spine, EEG, NCV/EMG, LP and extensive blood work ruling out .  No etiology for the patient' symptoms has been identified.   Patient was ambulating with a walker prior to admission.    Past Medical History:  Diagnosis Date  . Arthritis    joints, shoulder and knees  . Diverticulosis   . GERD (gastroesophageal reflux disease)   . Heart murmur    no doctor follows her for this  . Hypertension     Past Surgical History:  Procedure Laterality Date  . BILATERAL CARPAL TUNNEL RELEASE Bilateral   . DILATION AND CURETTAGE OF UTERUS    . FRACTURE SURGERY Left 2007   fractured left  hip  . HARDWARE REMOVAL Left 06/24/2015   Procedure: HARDWARE REMOVAL/ [facility];  Surgeon: Dereck Leep, MD;  Location: [facility] ORS;  Service: Orthopedics;  Laterality: Left;  . IRRIGATION AND DEBRIDEMENT ABSCESS Right 09/24/2015   Procedure: IRRIGATION AND DEBRIDEMENT OF RIGHT HIP ABSCESS;  Surgeon: Florene Glen, MD;  Location: [facility] ORS;  Service: General;  Laterality: Right;  . JOINT REPLACEMENT Right 08/2014   total knee replacement  . KNEE ARTHROSCOPY Left   . ROTATOR CUFF REPAIR Right 2006   Dr. Mauri Pole   . TONSILLECTOMY    . TOTAL HIP ARTHROPLASTY Left 06/24/2015   Procedure: TOTAL HIP  ARTHROPLASTY;  Surgeon: Dereck Leep, MD;  Location: [facility] ORS;  Service: Orthopedics;  Laterality: Left;  . TOTAL KNEE ARTHROPLASTY Right 08/25/2014   Dr. Margaretmary Eddy    Family History  Problem Relation Age of Onset  . Diabetes Father   . Breast cancer Neg Hx     Social History:  reports that she has never smoked. She has never used smokeless tobacco. She reports that she does not drink alcohol or use drugs.  Allergies  Allergen Reactions  . Statins Other (See Comments)    Reaction:  Leg cramps   . Sulfa Antibiotics Nausea Only  . Other Rash    "leg cramps"  . Penicillins Rash and Other (See Comments)    Has patient had a PCN reaction causing immediate rash, facial/tongue/throat swelling, SOB or lightheadedness with hypotension: No Has patient had a PCN reaction causing severe rash involving mucus membranes or skin necrosis: No Has patient had a PCN reaction that required hospitalization No Has patient had a PCN reaction occurring within the last 10 years: Yes If all of the above answers are "NO", then may proceed with Cephalosporin use.  . Tape Rash    Medications:  I have reviewed the patient's current medications. Prior to Admission:  Prescriptions Prior to Admission  Medication Sig Dispense Refill Last Dose  . acetaminophen (TYLENOL) 325 MG tablet Take 1 tablet (325 mg total) by mouth every 6 (six) hours as needed for mild pain (or Fever >/=  101).   01/29/2016 at unknown  . aspirin EC 81 MG tablet Take 81 mg by mouth every morning.    01/28/2016 at 0900  . cetirizine (ZYRTEC) 10 MG tablet Take 10 mg by mouth daily as needed for allergies.   01/28/2016 at 0900  . levETIRAcetam (KEPPRA) 750 MG tablet Take 750 mg by mouth 2 (two) times daily.   01/28/2016 at 2030  . meclizine (ANTIVERT) 25 MG tablet Take 25 mg by mouth 3 (three) times daily as needed for dizziness.   prn at prn  . nystatin (MYCOSTATIN) 100000 UNIT/ML suspension Take 5 mLs by mouth 4 (four) times daily.   prn at  prn  . nystatin cream (MYCOSTATIN) Apply 1 application topically daily as needed for dry skin.    prn at prn  . ondansetron (ZOFRAN-ODT) 8 MG disintegrating tablet Take 8 mg by mouth every 8 (eight) hours as needed for nausea or vomiting. Reported on 09/29/2015   prn at prn  . polyethylene glycol (MIRALAX / GLYCOLAX) packet Take 17 g by mouth daily as needed for mild constipation. 30 each 0 prn at prn  . potassium chloride SA (K-DUR,KLOR-CON) 20 MEQ tablet Take 20 mEq by mouth at bedtime.   01/28/2016 at 0900  . RABEprazole (ACIPHEX) 20 MG tablet Take 20 mg by mouth 2 (two) times daily.   01/28/2016 at 2030  . ranitidine (ZANTAC) 150 MG tablet Take 150 mg by mouth 2 (two) times daily as needed for heartburn.    prn at prn  . vitamin B-12 (CYANOCOBALAMIN) 1000 MCG tablet Take 1,000 mcg by mouth every morning.   01/28/2016 at 0900  . Vitamin D, Ergocalciferol, (DRISDOL) 50000 units CAPS capsule Take 50,000 Units by mouth every 7 (seven) days. Pt takes on Sunday.   Past Week at Unknown time  . LORazepam (ATIVAN) 0.5 MG tablet Take 1 tablet (0.5 mg total) by mouth daily as needed for anxiety. (Patient not taking: Reported on 01/31/2016) 10 tablet 0 Not Taking at prn   Scheduled: . acetic acid-hydrocortisone  3 drop Left Ear TID  . amLODipine  5 mg Oral Daily  . aspirin EC  81 mg Oral BH-q7a  . Chlorhexidine Gluconate Cloth  6 each Topical Q0600  . docusate sodium  100 mg Oral BID  . DULoxetine  30 mg Oral Daily  . enoxaparin (LOVENOX) injection  40 mg Subcutaneous Q24H  . famotidine  20 mg Oral Daily  . levETIRAcetam  750 mg Oral BID  . levofloxacin  500 mg Oral Daily  . mouth rinse  15 mL Mouth Rinse BID  . mupirocin ointment  1 application Nasal BID  . nystatin  5 mL Oral QID  . pantoprazole  40 mg Oral Daily  . potassium chloride SA  20 mEq Oral QHS  . vitamin B-12  1,000 mcg Oral BH-q7a  . [START ON 02/07/2016] Vitamin D (Ergocalciferol)  50,000 Units Oral Q7 days    ROS: History obtained  from the patient  General ROS: poor appetite. weight loss Psychological ROS: negative for - behavioral disorder, hallucinations, memory difficulties, mood swings or suicidal ideation Ophthalmic ROS: negative for - blurry vision, double vision, eye pain or loss of vision ENT ROS: negative for - epistaxis, nasal discharge, oral lesions, sore throat, tinnitus or vertigo Allergy and Immunology ROS: negative for - hives or itchy/watery eyes Hematological and Lymphatic ROS: negative for - bleeding problems, bruising or swollen lymph nodes Endocrine ROS: negative for - galactorrhea, hair pattern changes, polydipsia/polyuria or  temperature intolerance Respiratory ROS: negative for - cough, hemoptysis, shortness of breath or wheezing Cardiovascular ROS: negative for - chest pain, dyspnea on exertion, edema or irregular heartbeat Gastrointestinal ROS: abdominal pain, nausea/vomiting  Genito-Urinary ROS: negative for - dysuria, hematuria, incontinence or urinary frequency/urgency Musculoskeletal ROS: back pain Neurological ROS: as noted in HPI Dermatological ROS: negative for rash and skin lesion changes  Physical Examination: Blood pressure 139/72, pulse 89, temperature 97.7 F (36.5 C), temperature source Oral, resp. rate 16, height 5' 2"  (1.575 m), weight 53.7 kg (118 lb 4.8 oz), SpO2 99 %.  HEENT-  Normocephalic, no lesions, without obvious abnormality.  Normal external eye and conjunctiva.  Normal TM's bilaterally.  Normal auditory canals and external ears. Normal external nose, mucus membranes and septum.  Normal pharynx. Cardiovascular- S1, S2 normal, pulses palpable throughout   Lungs- chest clear, no wheezing, rales, normal symmetric air entry Abdomen- abdominal pain on palpation of the left upper quadrant Extremities- no edema Lymph-no adenopathy palpable Musculoskeletal-no joint tenderness, deformity or swelling Skin-warm and dry, no hyperpigmentation, vitiligo, or suspicious  lesions  Neurological Examination Mental Status: Lethargic but easily awakened, oriented, thought content appropriate.  Speech fluent without evidence of aphasia.  Able to follow 3 step commands without difficulty. Cranial Nerves: II: Discs flat bilaterally; Visual fields grossly normal, pupils equal, round, reactive to light and accommodation III,IV, VI: ptosis not present, extra-ocular motions intact bilaterally V,VII: smile symmetric, facial light touch sensation decreased throughout and including the scalp VIII: hearing normal bilaterally IX,X: gag reflex reduced XI: bilateral shoulder shrug XII: midline tongue extension Motor: 5/5 in the bilateral upper extremities.  Unable to lift either lower extremity off the bed.   Sensory: Pinprick and light touch impaired throughout with relative sparring in an area below the shoulder blades and to the mid thoracic level but only involving the back.  + saddle anesthesia Deep Tendon Reflexes: 2+ in the upper extremities and absent in the lower extremities Plantars: Right: mute   Left: mute Cerebellar: Normal finger-to-nose testing bilaterally Gait: unable to test due to safety concerns   Laboratory Studies:   Basic Metabolic Panel:  Recent Labs Lab 01/29/16 0838 01/30/16 0713 02/01/16 0430  NA 135 135 137  K 4.2 4.0 4.1  CL 100* 103 104  CO2 27 26 29   GLUCOSE 102* 80 97  BUN 11 10 7   CREATININE 0.69 0.64 0.43*  CALCIUM 9.8 9.1 9.4    Liver Function Tests:  Recent Labs Lab 01/29/16 0838  AST 24  ALT 19  ALKPHOS 108  BILITOT 0.4  PROT 7.2  ALBUMIN 3.3*   No results for input(s): LIPASE, AMYLASE in the last 168 hours. No results for input(s): AMMONIA in the last 168 hours.  CBC:  Recent Labs Lab 01/29/16 0838 01/30/16 0713 02/01/16 0430  WBC 10.3 8.7 9.8  NEUTROABS 5.5  --   --   HGB 13.0 12.0 11.6*  HCT 37.7 35.2 34.1*  MCV 86.2 87.1 86.9  PLT 490* 371 312    Cardiac Enzymes:  Recent Labs Lab  01/29/16 1351 01/29/16 2109 01/30/16 0220 01/30/16 0713  TROPONINI <0.03 <0.03 <0.03 <0.03    BNP: Invalid input(s): POCBNP  CBG:  Recent Labs Lab 01/30/16 0740 01/30/16 1720 01/31/16 0851 02/01/16 0804  GLUCAP 72 80 84 89    Microbiology: Results for orders placed or performed during the hospital encounter of 01/29/16  Culture, blood (routine x 2)     Status: None (Preliminary result)   Collection Time: 01/29/16  1:51 PM  Result Value Ref Range Status   Specimen Description BLOOD LEFTSM FA  Final   Special Requests BOTTLES DRAWN AEROBIC AND ANAEROBIC ANA6CC AER [place]  Final   Culture NO GROWTH 3 DAYS  Final   Report Status PENDING  Incomplete  Culture, blood (routine x 2)     Status: None (Preliminary result)   Collection Time: 01/29/16  1:51 PM  Result Value Ref Range Status   Specimen Description BLOOD RIGHT SM AC  Final   Special Requests BOTTLES DRAWN AEROBIC AND ANAEROBIC [place]  Final   Culture NO GROWTH 3 DAYS  Final   Report Status PENDING  Incomplete  Urine culture     Status: Abnormal   Collection Time: 01/29/16  4:13 PM  Result Value Ref Range Status   Specimen Description URINE, RANDOM  Final   Special Requests NONE  Final   Culture >=100,000 COLONIES/mL CITROBACTER BRAAKII (A)  Final   Report Status 01/31/2016 FINAL  Final   Organism ID, Bacteria CITROBACTER BRAAKII (A)  Final      Susceptibility   Citrobacter braakii - MIC*    CEFAZOLIN >=64 RESISTANT Resistant     CEFTRIAXONE <=1 SENSITIVE Sensitive     CIPROFLOXACIN <=0.25 SENSITIVE Sensitive     GENTAMICIN <=1 SENSITIVE Sensitive     IMIPENEM <=0.25 SENSITIVE Sensitive     NITROFURANTOIN 32 SENSITIVE Sensitive     TRIMETH/SULFA <=20 SENSITIVE Sensitive     PIP/TAZO <=4 SENSITIVE Sensitive     * >=100,000 COLONIES/mL CITROBACTER BRAAKII  MRSA PCR Screening     Status: Abnormal   Collection Time: 01/29/16  8:25 PM  Result Value Ref Range Status   MRSA by PCR POSITIVE (A) NEGATIVE Final     Comment:        The GeneXpert MRSA Assay (FDA approved for NASAL specimens only), is one component of a comprehensive MRSA colonization surveillance program. It is not intended to diagnose MRSA infection nor to guide or monitor treatment for MRSA infections. RESULT CALLED TO, READ BACK BY AND VERIFIED WITH: KASEY VAUGHANS WARD AT 2224 ON 01/29/16 RWW     Coagulation Studies: No results for input(s): LABPROT, INR in the last 72 hours.  Urinalysis:  Recent Labs Lab 01/29/16 1613  COLORURINE YELLOW*  LABSPEC 1.014  PHURINE 6.0  GLUCOSEU NEGATIVE  HGBUR 1+*  BILIRUBINUR NEGATIVE  KETONESUR NEGATIVE  PROTEINUR 30*  NITRITE POSITIVE*  LEUKOCYTESUR 3+*    Lipid Panel:  No results found for: CHOL, TRIG, HDL, CHOLHDL, VLDL, LDLCALC  HgbA1C: No results found for: HGBA1C  Urine Drug Screen:  No results found for: LABOPIA, COCAINSCRNUR, LABBENZ, AMPHETMU, THCU, LABBARB  Alcohol Level:  Recent Labs Lab 01/29/16 [place] <5    Other results: EKG: normal sinus rhythm at 91 bpm.  Imaging: Dg Lumbar Spine 2-3 Views  Result Date: 01/30/2016 CLINICAL DATA:  Initial evaluation for acute lower back pain. EXAM: LUMBAR SPINE - 2-3 VIEW COMPARISON:  Comparison made with prior CT from 07/16/2015. FINDINGS: Vertebral bodies normally aligned with preservation of the normal lumbar lordosis. Compression deformities involving the T12, L2, and L3 vertebral bodies are stable. No acute fracture or subluxation. Multilevel degenerative spondylolysis grossly stable, most prevalent at L4-5. Visualized sacrum intact. Left total hip arthroplasty partially visualized. SI joints approximated. Bladder is distended with secrete IV contrast material within the bladder lumen. Soft tissues otherwise unremarkable. Aortic atherosclerosis noted. Osteopenia noted. IMPRESSION: 1. No radiographic evidence for acute abnormality within the lumbar spine. 2. Stable  compression deformities involving the T12, L2, and L3  vertebral bodies. 3. Osteopenia. 4. Aortic atherosclerosis. Electronically Signed   By: Jeannine Boga M.D.   On: 01/30/2016 18:25     Assessment/Plan: 70 year old female with a constellation of symptoms that appear to have been progressing to the point that she is now unable to eat or ambulate.  Unclear if the deconditioning from her not eating has led to her being unable to ambulate.  Despite this possibility she has multiple other complaints that date to March of this year.  Patient has had extensive evaluations at [facility] and [place].  I have reviewed these work ups in detail.  They have been unrevealing but continue.  The only abnormalities noted were her CRP, ESR, PTH and vitamin D.  Will recheck these levels and evaluate heavy metal screen ACE level and lyme titer as well.  With patient being numb all over this suggests more of a metabolic abnormality or systemic issue as opposed to a focal neurological abnormality.  With patient acutely being unable to ambulate will look further into a spinal etiology.  This would no explain her complaints above the neck though.  I suspect that this work up will continue beyond this hospitalization with the goals of this hospitalization being to improve patient's nutritional status so she can undergo strengthening with therapy.     Recommendations: 1.  MRI of the lumbar and thoracic spines 2.  D/C Remeron.  Family reports that patient is too lethargic on this medication 3.  CRP, ESR, PTH, Vitamin D, lyme titer, ACE level, heavy metal screen.   4.  Will continue to follow with you  Alexis Goodell, MD Neurology [phone] 02/01/2016, 1:17 PM

## 2016-02-01 NOTE — Evaluation (Signed)
Clinical/Bedside Swallow Evaluation Patient Details  Name: Nancy York MRN: [id number] Date of Birth: Jun 12, 1945  Today's Date: 02/01/2016 Time: SLP Start Time (ACUTE ONLY): 0830 SLP Stop Time (ACUTE ONLY): 0930 SLP Time Calculation (min) (ACUTE ONLY): 60 min  Past Medical History:  Past Medical History:  Diagnosis Date  . Arthritis    joints, shoulder and knees  . Diverticulosis   . GERD (gastroesophageal reflux disease)   . Heart murmur    no doctor follows her for this  . Hypertension    Past Surgical History:  Past Surgical History:  Procedure Laterality Date  . BILATERAL CARPAL TUNNEL RELEASE Bilateral   . DILATION AND CURETTAGE OF UTERUS    . FRACTURE SURGERY Left 2007   fractured left  hip  . HARDWARE REMOVAL Left 06/24/2015   Procedure: HARDWARE REMOVAL/ DHS;  Surgeon: Donato Heinz, MD;  Location: [facility] ORS;  Service: Orthopedics;  Laterality: Left;  . IRRIGATION AND DEBRIDEMENT ABSCESS Right 09/24/2015   Procedure: IRRIGATION AND DEBRIDEMENT OF RIGHT HIP ABSCESS;  Surgeon: Lattie Haw, MD;  Location: [facility] ORS;  Service: General;  Laterality: Right;  . JOINT REPLACEMENT Right 08/2014   total knee replacement  . KNEE ARTHROSCOPY Left   . ROTATOR CUFF REPAIR Right 2006   Dr. Gerrit Heck   . TONSILLECTOMY    . TOTAL HIP ARTHROPLASTY Left 06/24/2015   Procedure: TOTAL HIP ARTHROPLASTY;  Surgeon: Donato Heinz, MD;  Location: [facility] ORS;  Service: Orthopedics;  Laterality: Left;  . TOTAL KNEE ARTHROPLASTY Right 08/25/2014   Dr. Reita Chard   HPI:  Pt is a 70 y.o. female with a known history of heart murmur, hypertension, arthritis, diverticulosis, gastroesophageal reflux disease, recent hip surgery, femur fracture, who was in and out from hospital and placement comes back to the hospital with weakness, inability to eat, weight loss, intermittent nausea, vomiting, feeling dizzy. In emergency room, she was noted to be hypothermic, intermittently tachypneic, hypoxic, her labs  revealed pyuria, mild CO2 retention to 62, low PCO2, however, patient's pH was normal. Patient complains of numerous multiple medical problems, weakness, pain in the sides in the back, weight loss, some cough with clear to yellow phlegm, nausea, vomiting, constipation, increased frequency of urination, urge incontinence, numbness from her face down to toes, inability to move up her lower extremities, inability to eat due to numbness in the mouth and so forth, however, during my presentation she is able to turn from one side to the other side in the bed and able to use her extremities, plus she speaks fluently. Extensive workup in [facility] with brain MRI, unremarkable electroencephalogram, unremarkable, MRI of cervical spine, revealed multilevel degenerative changes with moderate to marked neuroforaminal narrowing, mild spinal canal narrowing. Numerous other studies were performed in [facility] and [facility] over the past one year. Pt stated since March of 2017 (the cellulitis w/ admission), she has had deficits including numbness around the face and mouth, difficulty swallowing, and "some" Dysarthria. These issues are ongoing and current now. Pt does not appear aware of the degree of Dysarthria though. Pt has been taking some po's but w/ decreased oral intake overall and weight loss.    Assessment / Plan / Recommendation Clinical Impression  Pt appears to present w/ increased risk for aspiration w/ po intake at the time of this bedside evaluation today. Pt was assessed w/ trials of Nectar consistency liquids via cup/straw and small tsps of applesauce. During the oral phase, pt exhibited a slight delay in A-P  transfer for coordination of bolus management for swallowing; no other gross deficits noted w/ the trial consistencies assessed. Pt stated she had difficulty masticating any solids d/t the numbness she experiences intraorally ("I have bitten my tongue badly at times.").  During the pharyngeal phase, pt exhibited  mild throat clear and/or cough following the trials; no decline overall in respiratory status noted post trials. Pt stated the throat clearing w/ cough she exhibited when eating/drinking was similar to what she experiences at home. Pt described using thickened liquids in the past. Pt did not elaborate on how much, or what, she ate/drank at home but did state she had been eating less at home; she identified Oatmeal as a food she could eat. Unsure if pt has a GI/Esophageal dysmotility deficit or not. MD consulted w/ the results of the evaluation and concern for risk for aspiration; baseline dysphagia (puree foods) and use of thickener for liquids which was a severe change in her status since March 2017 post the lengthy illness then. MD agreed w/ recommendation for MBSS tomorrow; possible assessment for Esophageal motility. Recommended continue w/ current diet as ordered until then w/ strict aspiration precautions; meds in Puree - Whole, one at a time as she did during this session/eval today w/ SLP and NSG. ST will be available for further education and toleration of po's while admitted; MBSS tomorrow. Recommended Neurology consult d/t the Dysarthria and Dysphagia.     Aspiration Risk  Moderate aspiration risk    Diet Recommendation  Dysphagia 2 w/ Nectar liquids; strict aspiration precautions; feeding assistance at all meals.   Medication Administration: Whole meds with puree    Other  Recommendations Recommended Consults:  (Dietician) Oral Care Recommendations: Oral care BID;Staff/trained caregiver to provide oral care Other Recommendations: Order thickener from pharmacy;Prohibited food (jello, ice cream, thin soups);Remove water pitcher;Have oral suction available   Follow up Recommendations  (TBD)      Frequency and Duration min 3x week  2 weeks       Prognosis Prognosis for Safe Diet Advancement: Guarded Barriers to Reach Goals: Time post onset;Severity of deficits      Swallow Study    General Date of Onset: 01/29/16 HPI: Pt is a 70 y.o. female with a known history of heart murmur, hypertension, arthritis, diverticulosis, gastroesophageal reflux disease, recent hip surgery, femur fracture, who was in and out from hospital and placement comes back to the hospital with weakness, inability to eat, weight loss, intermittent nausea, vomiting, feeling dizzy. In emergency room, she was noted to be hypothermic, intermittently tachypneic, hypoxic, her labs revealed pyuria, mild CO2 retention to 62, low PCO2, however, patient's pH was normal. Patient complains of numerous multiple medical problems, weakness, pain in the sides in the back, weight loss, some cough with clear to yellow phlegm, nausea, vomiting, constipation, increased frequency of urination, urge incontinence, numbness from her face down to toes, inability to move up her lower extremities, inability to eat due to numbness in the mouth and so forth, however, during my presentation she is able to turn from one side to the other side in the bed and able to use her extremities, plus she speaks fluently. Extensive workup in [facility] with brain MRI, unremarkable electroencephalogram, unremarkable, MRI of cervical spine, revealed multilevel degenerative changes with moderate to marked neuroforaminal narrowing, mild spinal canal narrowing. Numerous other studies were performed in [facility] and [facility] over the past one year. Pt stated since March of 2017 (the cellulitis w/ admission), she has had deficits including  numbness around the face and mouth, difficulty swallowing, and "some" Dysarthria. These issues are ongoing and current now. Pt does not appear aware of the degree of Dysarthria though. Pt has been taking some po's but w/ decreased oral intake overall and weight loss.  Type of Study: Bedside Swallow Evaluation Previous Swallow Assessment: none indicated by pt Diet Prior to this Study: Dysphagia 3 (soft);Thin liquids (soft foods at home  per pt; currently on dysphagia diet) Temperature Spikes Noted: No (wbc 9.8; low temps) Respiratory Status: Room air History of Recent Intubation: No Behavior/Cognition: Alert;Cooperative;Pleasant mood Oral Cavity Assessment: Dry Oral Care Completed by SLP: Recent completion by staff Oral Cavity - Dentition: Missing dentition (few) Vision: Functional for self-feeding Self-Feeding Abilities: Able to feed self;Needs assist;Needs set up;Total assist Patient Positioning: Upright in bed Baseline Vocal Quality: Low vocal intensity Volitional Cough:  (Fair) Volitional Swallow: Able to elicit    Oral/Motor/Sensory Function Overall Oral Motor/Sensory Function:  (decreased coordinated lingual movements)   Ice Chips Ice chips: Within functional limits Presentation: Spoon (fed; 3 trials)   Thin Liquid Thin Liquid: Impaired Presentation: Cup;Self Fed;Straw (3 trials via each) Oral Phase Impairments: Poor awareness of bolus (secondary to numbness per pt) Oral Phase Functional Implications: Prolonged oral transit (anterior spillage x1; stated it occurs at home) Pharyngeal  Phase Impairments: Suspected delayed Swallow;Throat Clearing - Immediate;Throat Clearing - Delayed;Cough - Delayed;Cough - Immediate    Nectar Thick Nectar Thick Liquid: Impaired Presentation: Cup;Self Fed;Straw (2 trials via each) Oral Phase Impairments: Poor awareness of bolus (secondary to numbness per pt) Oral phase functional implications: Prolonged oral transit (none) Pharyngeal Phase Impairments: Throat Clearing - Delayed;Cough - Delayed (intermittent)   Honey Thick Honey Thick Liquid: Not tested   Puree Puree: Impaired Presentation: Spoon (fed; 5 trials) Oral Phase Impairments: Poor awareness of bolus (secondary to numbness per pt) Oral Phase Functional Implications: Prolonged oral transit (slight) Pharyngeal Phase Impairments: Throat Clearing - Delayed (x1)   Solid   GO   Solid: Not tested         Jerilynn Som, MS, CCC-SLP Nancy York 02/01/2016,4:02 PM

## 2016-02-01 NOTE — Progress Notes (Signed)
[place] [facility] [place] NAME: Nancy York    MR#:  [id number]  DATE OF BIRTH:  [birth year]  SUBJECTIVE:   Pt. Was quite hypothermic this a.m. Also complaining of ongoing dysphagia, no peri-oral numbness.  Pt. Still having significant LE weakness and PO intake is very poor.      REVIEW OF SYSTEMS:    Review of Systems  Constitutional: Negative for chills and fever.  HENT: Negative for congestion and tinnitus.   Eyes: Negative for blurred vision and double vision.  Respiratory: Negative for cough, shortness of breath and wheezing.   Cardiovascular: Negative for chest pain, orthopnea and PND.  Gastrointestinal: Negative for abdominal pain, diarrhea, nausea and vomiting.  Genitourinary: Negative for dysuria and hematuria.  Musculoskeletal: Positive for back pain.  Neurological: Positive for focal weakness (Lower ext. weakness. ) and weakness. Negative for dizziness and sensory change.  All other systems reviewed and are negative.   Nutrition: dysphagia II with nectar thick liquid Tolerating Diet: Yes but very little Tolerating PT: Eval noted.   DRUG ALLERGIES:   Allergies  Allergen Reactions  . Statins Other (See Comments)    Reaction:  Leg cramps   . Sulfa Antibiotics Nausea Only  . Other Rash    "leg cramps"  . Penicillins Rash and Other (See Comments)    Has patient had a PCN reaction causing immediate rash, facial/tongue/throat swelling, SOB or lightheadedness with hypotension: No Has patient had a PCN reaction causing severe rash involving mucus membranes or skin necrosis: No Has patient had a PCN reaction that required hospitalization No Has patient had a PCN reaction occurring within the last 10 years: Yes If all of the above answers are "NO", then may proceed with Cephalosporin use.  . Tape Rash    VITALS:  Blood pressure 139/72, pulse 89, temperature 97.7 F (36.5 C), temperature source Oral, resp. rate 16, height 5' 2"   (1.575 m), weight 53.7 kg (118 lb 4.8 oz), SpO2 99 %.  PHYSICAL EXAMINATION:   Physical Exam  GENERAL:  70 y.o.-year-old patient sitting up in chair with flat affect but in NAD.   EYES: Pupils equal, round, reactive to light and accommodation. No scleral icterus. Extraocular muscles intact.  HEENT: Head atraumatic, normocephalic. Oropharynx and nasopharynx clear.  NECK:  Supple, no jugular venous distention. No thyroid enlargement, no tenderness.  LUNGS: Normal breath sounds bilaterally, no wheezing, rales, rhonchi. No use of accessory muscles of respiration.  CARDIOVASCULAR: S1, S2 normal. No murmurs, rubs, or gallops.  ABDOMEN: Soft, nontender, nondistended. Bowel sounds present. No organomegaly or mass.  EXTREMITIES: No cyanosis, clubbing or edema b/l.    NEUROLOGIC: Cranial nerves II through XII are intact. No focal Motor or sensory deficits b/l.  Dense Lower ext weakness 2/5 strength b/l.       PSYCHIATRIC: The patient is alert and oriented x 3. Flat affect.   SKIN: No obvious rash, lesion, or ulcer.    LABORATORY PANEL:   CBC  Recent Labs Lab 02/01/16 0430  WBC 9.8  HGB 11.6*  HCT 34.1*  PLT 312   ------------------------------------------------------------------------------------------------------------------  Chemistries   Recent Labs Lab 01/29/16 0838  02/01/16 0430  NA 135  < > 137  K 4.2  < > 4.1  CL 100*  < > 104  CO2 27  < > 29  GLUCOSE 102*  < > 97  BUN 11  < > 7  CREATININE 0.69  < > 0.43*  CALCIUM 9.8  < >  9.4  AST 24  --   --   ALT 19  --   --   ALKPHOS 108  --   --   BILITOT 0.4  --   --   < > = values in this interval not displayed. ------------------------------------------------------------------------------------------------------------------  Cardiac Enzymes  Recent Labs Lab 01/30/16 0713  TROPONINI <0.03   ------------------------------------------------------------------------------------------------------------------  RADIOLOGY:   Dg Lumbar Spine 2-3 Views  Result Date: 01/30/2016 CLINICAL DATA:  Initial evaluation for acute lower back pain. EXAM: LUMBAR SPINE - 2-3 VIEW COMPARISON:  Comparison made with prior CT from 07/16/2015. FINDINGS: Vertebral bodies normally aligned with preservation of the normal lumbar lordosis. Compression deformities involving the T12, L2, and L3 vertebral bodies are stable. No acute fracture or subluxation. Multilevel degenerative spondylolysis grossly stable, most prevalent at L4-5. Visualized sacrum intact. Left total hip arthroplasty partially visualized. SI joints approximated. Bladder is distended with secrete IV contrast material within the bladder lumen. Soft tissues otherwise unremarkable. Aortic atherosclerosis noted. Osteopenia noted. IMPRESSION: 1. No radiographic evidence for acute abnormality within the lumbar spine. 2. Stable compression deformities involving the T12, L2, and L3 vertebral bodies. 3. Osteopenia. 4. Aortic atherosclerosis. Electronically Signed   By: Jeannine Boga M.D.   On: 01/30/2016 18:25     ASSESSMENT AND PLAN:   70 year old female with past medical history of GERD, hypertension, diverticulosis, osteoarthritis, who presented to the hospital yesterday due to weakness, difficulty eating, weight loss, intermittent nausea vomiting and feeling dizzy. She was noted to have urinary tract infection.  1. Sepsis-patient met sepsis criteria given her hypothermia, tachycardia on admission along with abnormal urinalysis. - source is the urinary tract infection. Urine cultures positive for Citrobacter. -Continue Levaquin. Currently afebrile and hemodynamically stable.  2. Hypothermia - etiology unclear presently.  Pt's rectal temp this a.m. Was 92.9.   - improved w/ warming blanket.  TSH, Cortisol level in the past has been normal.   - will monitor.    3. Urinary tract infection-source of patient's sepsis.  - cont. Levaquin.  Urine cultures + for Citrobacter and it's  sensitive to it.   4. Difficulty ambulating, numbness, difficulty eating-patient has a conglomerate of neurological complaints and has had an extensive workup at [facility] and at [facility] which has been essentially benign.  -She's had a negative MRI head, negative EMG study, negative spinal tap.   - discussed plan with Neurology here (Dr. Doy Mince) and await further input. She plans on ordering further serologic work but likely pt. Will need outpatient Neuro follow up.   - seen by PT and will d/c to SNF upon discharge.  - seen by speech and plan on doing MBS tomorrow.   5. Back Pain - obtained Lumbar X-ray 2 days showing stable compression fractures but no other acute abnormality.  Cont. Supportive care.   6. Hx of Seizures - cont. Keppra  7. GERD - cont. Protonix.   8. Essential HTN - cont. Norvasc.   9. Depression - ?? Neurologic complaints due to underlying psych illness.  - appreciate Psych eval and started on Cymbalta and will cont. To monitor.   All the records are reviewed and case discussed with Care Management/Social Worker. Management plans discussed with the patient, family and they are in agreement.  CODE STATUS: Full code  DVT Prophylaxis: Lovenox  TOTAL TIME TAKING CARE OF THIS PATIENT: 35 minutes.   POSSIBLE D/C IN 1-2 DAYS, DEPENDING ON CLINICAL CONDITION.   Henreitta Leber M.D on 02/01/2016 at 1:34 PM  Between  7am to 6pm - Pager - [phone]  After 6pm go to [email or web address] - Proofreader  [facility]  Office  [phone]  CC: Primary care physician; Idelle Crouch, MD

## 2016-02-01 NOTE — Consult Note (Signed)
[facility] Face-to-Face Psychiatry Consult   Reason for Consult:  Consult for this 70 year old woman currently in the hospital with sepsis but with multiple neurologic and GI and other symptoms of unclear etiology. Concern was initially raised about Referring Physician:  Verdell Carmine Patient Identification: Nancy York MRN:  [id number] Principal Diagnosis: Somatization disorder Diagnosis:   Patient Active Problem List   Diagnosis Date Noted  . 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 Sepsis ([place]) [A41.9] 01/29/2016  . Hypothermia [T68.XXXA] 01/29/2016  . UTI (urinary tract infection) [N39.0] 01/29/2016  . Hypercapnia [R06.89] 01/29/2016  . Disease of thyroid gland [E07.9] 10/01/2015  . HLD (hyperlipidemia) [E78.5] 10/01/2015  . Pressure ulcer [L89.90] 09/24/2015  . Moderate protein-energy malnutrition ([place]) [E44.0] 08/10/2015  . Neuritis [M79.2] 08/05/2015  . Facial numbness [R20.0] 07/25/2015  . Generalized weakness [R53.1] 07/18/2015  . GERD (gastroesophageal reflux disease) [K21.9] 07/18/2015  . HTN (hypertension) [I10] 07/18/2015  . B12 deficiency [E53.8] 08/04/2014    Total Time spent with patient: 45 minutes  Subjective:   Nancy York is a 70 y.o. female patient admitted with "I'm tired of living like this and I don't feel good".  HPI:  This is a follow-up note for this 70 year old woman currently in the hospital for sepsis but with a history of multiple neurologic and GI and other symptoms that have been going on for months and have been resistant to comprehensive diagnosis. Consult was placed over the weekend because of concern about underlying mood or anxiety disorder. Dr. Jerilee Hoh saw the patient and elected to go along with a plan to start a low dose of mirtazapine at night but still could not find definite evidence of a  specific psychiatric disorder. I interviewed the patient again this afternoon and spoke with the husband as well. Also looked at the chart. Patient says that she's feeling bad but all of her complaints are directed to her multitude of medical issues. She says she has had thoughts about not wanting to live like this but absolutely denies any suicidal thoughts. Not reporting any psychosis. She has been apparently cooperative with the evaluation. She took the 15 mg of mirtazapine last night and she and her husband report that it made her so sedated that she slept through most of the day today so they do not want to take that anymore. They report that most of the time and emotionally she feels reasonably good and optimistic although every now and then she will have a "meltdown" during which she gets very anxious for a short period of time.  Past Psychiatric History: No psychiatric hospitalization. No previous psychiatric medicines other than the brief trial of Remeron and when necessary medicines when she is anxious. No history of suicide attempts  Risk to Self: Is patient at risk for suicide?: No Risk to Others:   Prior Inpatient Therapy:   Prior Outpatient Therapy:    Past Medical History:  Past Medical History:  Diagnosis Date  . Arthritis    joints, shoulder and knees  . Diverticulosis   . GERD (gastroesophageal reflux disease)   . Heart murmur    no doctor follows her for this  . Hypertension     Past Surgical History:  Procedure Laterality Date  . BILATERAL CARPAL TUNNEL RELEASE Bilateral   . DILATION AND CURETTAGE OF UTERUS    . FRACTURE SURGERY Left 2007   fractured left  hip  . HARDWARE REMOVAL Left 06/24/2015   Procedure: HARDWARE REMOVAL/ DHS;  Surgeon: Dereck Leep, MD;  Location: [facility] ORS;  Service: Orthopedics;  Laterality: Left;  . IRRIGATION AND DEBRIDEMENT ABSCESS Right 09/24/2015   Procedure: IRRIGATION AND DEBRIDEMENT OF RIGHT HIP ABSCESS;  Surgeon: Florene Glen, MD;   Location: [facility] ORS;  Service: General;  Laterality: Right;  . JOINT REPLACEMENT Right 08/2014   total knee replacement  . KNEE ARTHROSCOPY Left   . ROTATOR CUFF REPAIR Right 2006   Dr. Mauri Pole   . TONSILLECTOMY    . TOTAL HIP ARTHROPLASTY Left 06/24/2015   Procedure: TOTAL HIP ARTHROPLASTY;  Surgeon: Dereck Leep, MD;  Location: [facility] ORS;  Service: Orthopedics;  Laterality: Left;  . TOTAL KNEE ARTHROPLASTY Right 08/25/2014   Dr. Margaretmary Eddy   Family History:  Family History  Problem Relation Age of Onset  . Diabetes Father   . Breast cancer Neg Hx    Family Psychiatric  History: Mother did have some kind of mental health problem transiently during adulthood Social History:  History  Alcohol Use No     History  Drug Use No    Social History   Social History  . Marital status: Married    Spouse name: N/A  . Number of children: N/A  . Years of education: N/A   Social History Main Topics  . Smoking status: Never Smoker  . Smokeless tobacco: Never Used     Comment: no passive smoke in household  . Alcohol use No  . Drug use: No  . Sexual activity: Not Currently   Other Topics Concern  . None   Social History Narrative  . None   Additional Social History:    Allergies:   Allergies  Allergen Reactions  . Statins Other (See Comments)    Reaction:  Leg cramps   . Sulfa Antibiotics Nausea Only  . Other Rash    "leg cramps"  . Penicillins Rash and Other (See Comments)    Has patient had a PCN reaction causing immediate rash, facial/tongue/throat swelling, SOB or lightheadedness with hypotension: No Has patient had a PCN reaction causing severe rash involving mucus membranes or skin necrosis: No Has patient had a PCN reaction that required hospitalization No Has patient had a PCN reaction occurring within the last 10 years: Yes If all of the above answers are "NO", then may proceed with Cephalosporin use.  . Tape Rash    Labs:  Results for orders placed or  performed during the hospital encounter of 01/29/16 (from the past 48 hour(s))  Glucose, capillary     Status: None   Collection Time: 01/30/16  5:20 PM  Result Value Ref Range   Glucose-Capillary 80 65 - 99 mg/dL  Glucose, capillary     Status: None   Collection Time: 01/31/16  8:51 AM  Result Value Ref Range   Glucose-Capillary 84 65 - 99 mg/dL  CBC     Status: Abnormal   Collection Time: 02/01/16  4:30 AM  Result Value Ref Range   WBC 9.8 3.6 - 11.0 K/uL   RBC 3.93 3.80 - 5.20 MIL/uL   Hemoglobin 11.6 (L) 12.0 - 16.0 g/dL   HCT 34.1 (L) 35.0 - 47.0 %   MCV 86.9 80.0 - 100.0 fL   MCH 29.5 26.0 - 34.0 pg   MCHC 33.9 32.0 - 36.0 g/dL   RDW 16.1 (H) 11.5 - 14.5 %   Platelets 312 150 - 440 K/uL  Basic metabolic panel     Status: Abnormal   Collection Time: 02/01/16  4:30 AM  Result Value Ref Range   Sodium 137 135 - 145 mmol/L   Potassium 4.1 3.5 - 5.1 mmol/L   Chloride 104 101 - 111 mmol/L   CO2 29 22 - 32 mmol/L   Glucose, Bld 97 65 - 99 mg/dL   BUN 7 6 - 20 mg/dL   Creatinine, Ser 0.43 (L) 0.44 - 1.00 mg/dL   Calcium 9.4 8.9 - 10.3 mg/dL   GFR calc non Af Amer >60 >60 mL/min   GFR calc Af Amer >60 >60 mL/min    Comment: (NOTE) The eGFR has been calculated using the CKD EPI equation. This calculation has not been validated in all clinical situations. eGFR's persistently <60 mL/min signify possible Chronic Kidney Disease.    Anion gap 4 (L) 5 - 15  Glucose, capillary     Status: None   Collection Time: 02/01/16  8:04 AM  Result Value Ref Range   Glucose-Capillary 86 65 - 99 mg/dL  Sedimentation rate     Status: Abnormal   Collection Time: 02/01/16  1:50 PM  Result Value Ref Range   Sed Rate 55 (H) 0 - 30 mm/hr    Current Facility-Administered Medications  Medication Dose Route Frequency Provider Last Rate Last Dose  . acetaminophen (TYLENOL) tablet 650 mg  650 mg Oral Q6H PRN Theodoro Grist, MD   650 mg at 01/30/16 0011   Or  . acetaminophen (TYLENOL) suppository  650 mg  650 mg Rectal Q6H PRN Theodoro Grist, MD      . acetic acid-hydrocortisone(VOSOL-HC) otic solution 2-1%  3 drop Left Ear TID Theodoro Grist, MD   3 drop at 01/31/16 2200  . amLODipine (NORVASC) tablet 5 mg  5 mg Oral Daily Theodoro Grist, MD   5 mg at 02/01/16 0858  . aspirin EC tablet 81 mg  81 mg Oral Hulen Skains, MD   81 mg at 01/31/16 0626  . Chlorhexidine Gluconate Cloth 2 % PADS 6 each  6 each Topical Q0600 Theodoro Grist, MD   6 each at 02/01/16 0600  . docusate sodium (COLACE) capsule 100 mg  100 mg Oral BID Theodoro Grist, MD   100 mg at 02/01/16 0905  . DULoxetine (CYMBALTA) DR capsule 30 mg  30 mg Oral Daily Hildred Priest, MD   30 mg at 02/01/16 0905  . enoxaparin (LOVENOX) injection 40 mg  40 mg Subcutaneous Q24H Theodoro Grist, MD   40 mg at 01/31/16 2143  . famotidine (PEPCID) tablet 20 mg  20 mg Oral Daily PRN Henreitta Leber, MD      . levETIRAcetam (KEPPRA) tablet 750 mg  750 mg Oral BID Theodoro Grist, MD   750 mg at 02/01/16 0905  . levofloxacin (LEVAQUIN) tablet 500 mg  500 mg Oral Daily Henreitta Leber, MD   500 mg at 01/31/16 1744  . MEDLINE mouth rinse  15 mL Mouth Rinse BID Theodoro Grist, MD   15 mL at 01/31/16 2200  . morphine 2 MG/ML injection 1 mg  1 mg Intravenous Q4H PRN Harrie Foreman, MD   1 mg at 01/31/16 2358  . mupirocin ointment (BACTROBAN) 2 % 1 application  1 application Nasal BID Theodoro Grist, MD   1 application at 89/21/19 1334  . nystatin (MYCOSTATIN) 100000 UNIT/ML suspension 500,000 Units  5 mL Oral QID Theodoro Grist, MD   500,000 Units at 02/01/16 1400  . nystatin  cream (MYCOSTATIN) 1 application  1 application Topical BID PRN Theodoro Grist, MD   1 application at 56/21/30 2142  . ondansetron (ZOFRAN) tablet 4 mg  4 mg Oral Q6H PRN Theodoro Grist, MD       Or  . ondansetron (ZOFRAN) injection 4 mg  4 mg Intravenous Q6H PRN Theodoro Grist, MD   4 mg at 02/01/16 0906  . ondansetron (ZOFRAN-ODT) disintegrating tablet 8 mg  8 mg Oral  Q8H PRN Theodoro Grist, MD      . oxyCODONE-acetaminophen (PERCOCET/ROXICET) 5-325 MG per tablet 1 tablet  1 tablet Oral Q6H PRN Harrie Foreman, MD   1 tablet at 02/01/16 1332  . pantoprazole (PROTONIX) EC tablet 40 mg  40 mg Oral Daily Theodoro Grist, MD   40 mg at 02/01/16 0905  . polyethylene glycol (MIRALAX / GLYCOLAX) packet 17 g  17 g Oral Daily PRN Theodoro Grist, MD      . potassium chloride SA (K-DUR,KLOR-CON) CR tablet 20 mEq  20 mEq Oral QHS Theodoro Grist, MD   20 mEq at 01/31/16 2144  . vitamin B-12 (CYANOCOBALAMIN) tablet 1,000 mcg  1,000 mcg Oral Hulen Skains, MD   1,000 mcg at 02/01/16 0905  . [START ON 02/07/2016] Vitamin D (Ergocalciferol) (DRISDOL) capsule 50,000 Units  50,000 Units Oral Q7 days Theodoro Grist, MD        Musculoskeletal: Strength & Muscle Tone: decreased and atrophy Gait & Station: unable to stand Patient leans: Backward  Psychiatric Specialty Exam: Physical Exam  Nursing note and vitals reviewed. Constitutional: She appears well-developed and well-nourished.  HENT:  Head: Normocephalic and atraumatic.  Eyes: Conjunctivae are normal. Pupils are equal, round, and reactive to light.  Neck: Normal range of motion.  Cardiovascular: Regular rhythm and normal heart sounds.   Respiratory: Effort normal. No respiratory distress.  GI: Soft.  Musculoskeletal: Normal range of motion.  Neurological: She is alert.  Skin: Skin is warm and dry.  Psychiatric: She has a normal mood and affect. Her speech is normal and behavior is normal. Judgment and thought content normal. Cognition and memory are normal.    Review of Systems  Constitutional: Positive for malaise/fatigue.  HENT: Negative.   Eyes: Negative.   Respiratory: Positive for cough.   Cardiovascular: Negative.   Gastrointestinal: Positive for abdominal pain, nausea and vomiting.  Musculoskeletal: Negative.   Skin: Negative.   Neurological: Positive for tingling, sensory change and weakness.   Psychiatric/Behavioral: Negative for depression, hallucinations, memory loss, substance abuse and suicidal ideas. The patient has insomnia. The patient is not nervous/anxious.     Blood pressure 139/72, pulse 89, temperature 97.7 F (36.5 C), temperature source Oral, resp. rate 16, height 5' 2"  (1.575 m), weight 53.7 kg (118 lb 4.8 oz), SpO2 99 %.Body mass index is 21.64 kg/m.  General Appearance: Disheveled  Eye Contact:  Good  Speech:  Clear and Coherent  Volume:  Normal  Mood:  Dysphoric  Affect:  Appropriate  Thought Process:  Goal Directed  Orientation:  Full (Time, Place, and Person)  Thought Content:  Logical  Suicidal Thoughts:  No  Homicidal Thoughts:  No  Memory:  Immediate;   Good Recent;   Fair Remote;   Fair  Judgement:  Fair  Insight:  Fair  Psychomotor Activity:  Decreased  Concentration:  Concentration: Fair  Recall:  [employer] of Knowledge:  Fair  Language:  Fair  Akathisia:  No  Handed:  Right  AIMS (if indicated):     Assets:  Communication Skills Desire for Improvement Financial Resources/Insurance Housing Physical Health Resilience Social Support  ADL's:  Impaired  Cognition:  WNL  Sleep:        Treatment Plan Summary: Plan This is a 70 year old woman with multiple symptoms in the range of neurologic and gastric problems that have persistently elevated any specific diagnosis. On mental health evaluation she is presenting herself as having appropriate distress about her symptoms but denies any intent or thought of harming herself and overall appears to be upbeat and euthymic. I don't think there is enough evidence of a depressive disorder to justify restarting any antidepressant medicine. I certainly think it is possible that this patient has a somatization disorder but there really isn't any specific treatment for that. I am glad that Dr. Doy Mince is willing to continue pushing for a complete evaluation of possible causes of her symptoms. Patient and  husband agree with my suggestion to not start any antidepressant or other psychiatric medicine. I will simply follow from a distance. If there is a new psychiatric concern please let me know and I will be happy to reevaluate her.  Disposition: Patient does not meet criteria for psychiatric inpatient admission. Supportive therapy provided about ongoing stressors.  Alethia Berthold, MD 02/01/2016 4:44 PM

## 2016-02-01 NOTE — Progress Notes (Signed)
Got patient on the chair to eat, body temp 97.9 oral.  Lower extremity strength very weak.

## 2016-02-02 ENCOUNTER — Inpatient Hospital Stay: Payer: Commercial Managed Care - HMO

## 2016-02-02 LAB — HEMOGLOBIN A1C
Hgb A1c MFr Bld: 4.4 % — ABNORMAL LOW (ref 4.8–5.6)
Mean Plasma Glucose: 80 mg/dL

## 2016-02-02 LAB — HEAVY METALS, BLOOD
Arsenic: 7 ug/L (ref 2–23)
Lead: 1 ug/dL (ref 0–19)
Mercury: NOT DETECTED ug/L (ref 0.0–14.9)

## 2016-02-02 LAB — GLUCOSE, CAPILLARY: GLUCOSE-CAPILLARY: 97 mg/dL (ref 65–99)

## 2016-02-02 LAB — VITAMIN D 25 HYDROXY (VIT D DEFICIENCY, FRACTURES): VIT D 25 HYDROXY: 54.7 ng/mL (ref 30.0–100.0)

## 2016-02-02 LAB — ANGIOTENSIN CONVERTING ENZYME: Angiotensin-Converting Enzyme: 47 U/L (ref 14–82)

## 2016-02-02 LAB — B. BURGDORFI ANTIBODIES

## 2016-02-02 LAB — PARATHYROID HORMONE, INTACT (NO CA): PTH: 10 pg/mL — ABNORMAL LOW (ref 15–65)

## 2016-02-02 MED ORDER — DIAZEPAM 5 MG PO TABS
5.0000 mg | ORAL_TABLET | Freq: Once | ORAL | Status: AC
  Administered 2016-02-02: 12:00:00 5 mg via ORAL
  Filled 2016-02-02: qty 1

## 2016-02-02 MED ORDER — OXYCODONE-ACETAMINOPHEN 5-325 MG PO TABS: 1.0000 | ORAL_TABLET | Freq: Four times a day (QID) | ORAL | 0 refills | Status: DC | PRN

## 2016-02-02 MED ORDER — DULOXETINE HCL 30 MG PO CPEP: 30.0000 mg | ORAL_CAPSULE | Freq: Every day | ORAL | 3 refills | Status: DC

## 2016-02-02 NOTE — Progress Notes (Signed)
Subjective: Patient more alert this morning.  Reports pain improved as well.    Objective: Current vital signs: BP (!) 146/66   Pulse (!) 114   Temp 98.7 F (37.1 C)   Resp 18   Ht 5' 2"  (1.575 m)   Wt 53.5 kg (117 lb 14.4 oz)   SpO2 98%   BMI 21.56 kg/m  Vital signs in last 24 hours: Temp:  [97.7 F (36.5 C)-98.7 F (37.1 C)] 98.7 F (37.1 C) (10/10 0534) Pulse Rate:  [89-124] 114 (10/10 0534) Resp:  [16-18] 18 (10/09 2022) BP: (134-146)/(61-72) 146/66 (10/10 0534) SpO2:  [94 %-99 %] 98 % (10/10 0534) Weight:  [53.5 kg (117 lb 14.4 oz)] 53.5 kg (117 lb 14.4 oz) (10/10 0558)  Intake/Output from previous day: No intake/output data recorded. Intake/Output this shift: No intake/output data recorded. Nutritional status: DIET DYS 2 Room service appropriate? Yes; Fluid consistency: Nectar Thick  Neurologic Exam: Mental Status: Alert and awake.  Oriented, thought content appropriate.  Speech fluent without evidence of aphasia.  Able to follow 3 step commands without difficulty. Cranial Nerves: II: Discs flat bilaterally; Visual fields grossly normal, pupils equal, round, reactive to light and accommodation III,IV, VI: ptosis not present, extra-ocular motions intact bilaterally V,VII: smile symmetric, facial light touch sensation decreased throughout and including the scalp VIII: hearing normal bilaterally IX,X: gag reflex reduced XI: bilateral shoulder shrug XII: midline tongue extension Motor: 5/5 in the bilateral upper extremities.  Able to bend both legs at the knees weakly.  Able to wiggle the toes and flex and extend at the ankles.   Sensory: Pinprick and light touch impaired throughout with relative sparring in an area below the shoulder blades and to the mid thoracic level but only involving the back.   Deep Tendon Reflexes: 2+ in the upper extremities and absent in the lower extremities Plantars: Right: mute                              Left: mute  Lab  Results: Basic Metabolic Panel:  Recent Labs Lab 01/29/16 0838 01/30/16 0713 02/01/16 0430  NA 135 135 137  K 4.2 4.0 4.1  CL 100* 103 104  CO2 27 26 29   GLUCOSE 102* 80 97  BUN 11 10 7   CREATININE 0.69 0.64 0.43*  CALCIUM 9.8 9.1 9.4    Liver Function Tests:  Recent Labs Lab 01/29/16 0838  AST 24  ALT 19  ALKPHOS 108  BILITOT 0.4  PROT 7.2  ALBUMIN 3.3*   No results for input(s): LIPASE, AMYLASE in the last 168 hours. No results for input(s): AMMONIA in the last 168 hours.  CBC:  Recent Labs Lab 01/29/16 0838 01/30/16 0713 02/01/16 0430  WBC 10.3 8.7 9.8  NEUTROABS 5.5  --   --   HGB 13.0 12.0 11.6*  HCT 37.7 35.2 34.1*  MCV 86.2 87.1 86.9  PLT 490* 371 312    Cardiac Enzymes:  Recent Labs Lab 01/29/16 1351 01/29/16 2109 01/30/16 0220 01/30/16 0713  TROPONINI <0.03 <0.03 <0.03 <0.03    Lipid Panel: No results for input(s): CHOL, TRIG, HDL, CHOLHDL, VLDL, LDLCALC in the last 168 hours.  CBG:  Recent Labs Lab 01/30/16 0740 01/30/16 1720 01/31/16 0851 02/01/16 0804 02/02/16 0731  GLUCAP 72 80 84 86 97    Microbiology: Results for orders placed or performed during the hospital encounter of 01/29/16  Culture, blood (routine x 2)  Status: None (Preliminary result)   Collection Time: 01/29/16  1:51 PM  Result Value Ref Range Status   Specimen Description BLOOD LEFTSM FA  Final   Special Requests BOTTLES DRAWN AEROBIC AND ANAEROBIC ANA6CC AER [place]  Final   Culture NO GROWTH 4 DAYS  Final   Report Status PENDING  Incomplete  Culture, blood (routine x 2)     Status: None (Preliminary result)   Collection Time: 01/29/16  1:51 PM  Result Value Ref Range Status   Specimen Description BLOOD RIGHT SM AC  Final   Special Requests BOTTLES DRAWN AEROBIC AND ANAEROBIC [place]  Final   Culture NO GROWTH 4 DAYS  Final   Report Status PENDING  Incomplete  Urine culture     Status: Abnormal   Collection Time: 01/29/16  4:13 PM  Result Value Ref  Range Status   Specimen Description URINE, RANDOM  Final   Special Requests NONE  Final   Culture >=100,000 COLONIES/mL CITROBACTER BRAAKII (A)  Final   Report Status 01/31/2016 FINAL  Final   Organism ID, Bacteria CITROBACTER BRAAKII (A)  Final      Susceptibility   Citrobacter braakii - MIC*    CEFAZOLIN >=64 RESISTANT Resistant     CEFTRIAXONE <=1 SENSITIVE Sensitive     CIPROFLOXACIN <=0.25 SENSITIVE Sensitive     GENTAMICIN <=1 SENSITIVE Sensitive     IMIPENEM <=0.25 SENSITIVE Sensitive     NITROFURANTOIN 32 SENSITIVE Sensitive     TRIMETH/SULFA <=20 SENSITIVE Sensitive     PIP/TAZO <=4 SENSITIVE Sensitive     * >=100,000 COLONIES/mL CITROBACTER BRAAKII  MRSA PCR Screening     Status: Abnormal   Collection Time: 01/29/16  8:25 PM  Result Value Ref Range Status   MRSA by PCR POSITIVE (A) NEGATIVE Final    Comment:        The GeneXpert MRSA Assay (FDA approved for NASAL specimens only), is one component of a comprehensive MRSA colonization surveillance program. It is not intended to diagnose MRSA infection nor to guide or monitor treatment for MRSA infections. RESULT CALLED TO, READ BACK BY AND VERIFIED WITH: KASEY VAUGHANS WARD AT 2224 ON 01/29/16 RWW     Coagulation Studies: No results for input(s): LABPROT, INR in the last 72 hours.  Imaging: No results found.  Medications:  I have reviewed the patient's current medications. Scheduled: . acetic acid-hydrocortisone  3 drop Left Ear TID  . amLODipine  5 mg Oral Daily  . aspirin EC  81 mg Oral BH-q7a  . Chlorhexidine Gluconate Cloth  6 each Topical Q0600  . diazepam  5 mg Oral Once  . docusate sodium  100 mg Oral BID  . DULoxetine  30 mg Oral Daily  . enoxaparin (LOVENOX) injection  40 mg Subcutaneous Q24H  . levETIRAcetam  750 mg Oral BID  . levofloxacin  500 mg Oral Daily  . mouth rinse  15 mL Mouth Rinse BID  . mupirocin ointment  1 application Nasal BID  . nystatin  5 mL Oral QID  . pantoprazole  40 mg  Oral Daily  . potassium chloride SA  20 mEq Oral QHS  . vitamin B-12  1,000 mcg Oral BH-q7a  . [START ON 02/07/2016] Vitamin D (Ergocalciferol)  50,000 Units Oral Q7 days    Assessment/Plan: Patient moving lower extremities more today than yesterday.  This maybe related to her level of alertness.  But continued lower extremity weakness.  MRI unable to be performed yesterday due to nausea.  Plan  is for imaging to be performed today.   Lab work shows a normal Vitamin D, ACE.  ESR remains elevated.  CRP increasing and now 6.0.  PTH low but calcium is normal.  Heavy metal screen and lyme titer pending.    Will continue to follow with you.   LOS: 4 days   Alexis Goodell, MD Neurology [phone] 02/02/2016  10:45 AM

## 2016-02-02 NOTE — Progress Notes (Signed)
Nutrition Follow-up  DOCUMENTATION CODES:   Severe malnutrition in context of chronic illness  INTERVENTION:  -Spoke with Dr Dot BeenSainaini and wants to start calorie count after SLP performs MBSS and recommends diet consistency to better determine need for PEG tube.  -Continue with magic cup and mightyshake secondary to consistency.   NUTRITION DIAGNOSIS:   Malnutrition related to chronic illness as evidenced by severe depletion of muscle mass, severe depletion of body fat, percent weight loss.  ongoing  GOAL:   Patient will meet greater than or equal to 90% of their needs  Not meeting needs  MONITOR:   PO intake, I & O's, Weight trends, Labs, Supplement acceptance  REASON FOR ASSESSMENT:   Malnutrition Screening Tool    ASSESSMENT:     Pt laying in bed this am on visit entry. Breakfast tray at bedside untouched.  Noted magic cup from last night opened but untouched.  Pt reports husband will be bringing her breakfast this am.    Planning MBSS today.    Medications reviewed:  Labs reviewed:  Diet Order:  DIET DYS 2 Room service appropriate? Yes; Fluid consistency: Thin  Skin:     Last BM:  01/28/2016  Height:   Ht Readings from Last 1 Encounters:  01/29/16 5\' 2"  (1.575 m)    Weight:   Wt Readings from Last 1 Encounters:  02/02/16 117 lb 14.4 oz (53.5 kg)    Ideal Body Weight:  50 kg  BMI:  Body mass index is 21.56 kg/m.  Estimated Nutritional Needs:   Kcal:  1375-1600 calories  Protein:  55-70 gm  Fluid:  >/= 1.3L  EDUCATION NEEDS:   No education needs identified at this time  Bradley Bostelman B. Freida BusmanAllen, RD, LDN [phone] (pager) Weekend/On-Call pager [phone])

## 2016-02-02 NOTE — Progress Notes (Signed)
PT Cancellation Note  Patient Details Name: Nancy York MRN: [id number] DOB: 1945/11/03   Cancelled Treatment:    Reason Eval/Treat Not Completed: Medical issues which prohibited therapy (Noted orders for pending thoracic and lumbar MRI to rule out central cord pathology.  Will hold activity pending results.  Will continue to follow and re-attempt next date.)   Eulanda Dorion H. Manson PasseyBrown, PT, DPT, NCS 02/02/16, 3:47 PM [phone]

## 2016-02-02 NOTE — Progress Notes (Signed)
[facility]. VSS. Report given to Marcelino ScotMarcus Collen RN at facility. EMS called for transportation.

## 2016-02-02 NOTE — Evaluation (Signed)
Objective Swallowing Evaluation: Type of Study: MBS-Modified Barium Swallow Study  Patient Details  Name: Nancy York MRN: [id number] Date of Birth: [birth year]  Today's Date: 02/02/2016 Time: SLP Start Time (ACUTE ONLY): 1130-SLP Stop Time (ACUTE ONLY): 1230 SLP Time Calculation (min) (ACUTE ONLY): 60 min  Past Medical History:  Past Medical History:  Diagnosis Date  . Arthritis    joints, shoulder and knees  . Diverticulosis   . GERD (gastroesophageal reflux disease)   . Heart murmur    no doctor follows her for this  . Hypertension    Past Surgical History:  Past Surgical History:  Procedure Laterality Date  . BILATERAL CARPAL TUNNEL RELEASE Bilateral   . DILATION AND CURETTAGE OF UTERUS    . FRACTURE SURGERY Left 2007   fractured left  hip  . HARDWARE REMOVAL Left 06/24/2015   Procedure: HARDWARE REMOVAL/ DHS;  Surgeon: Donato Heinz, MD;  Location: [facility] ORS;  Service: Orthopedics;  Laterality: Left;  . IRRIGATION AND DEBRIDEMENT ABSCESS Right 09/24/2015   Procedure: IRRIGATION AND DEBRIDEMENT OF RIGHT HIP ABSCESS;  Surgeon: Lattie Haw, MD;  Location: [facility] ORS;  Service: General;  Laterality: Right;  . JOINT REPLACEMENT Right 08/2014   total knee replacement  . KNEE ARTHROSCOPY Left   . ROTATOR CUFF REPAIR Right 2006   Dr. Gerrit Heck   . TONSILLECTOMY    . TOTAL HIP ARTHROPLASTY Left 06/24/2015   Procedure: TOTAL HIP ARTHROPLASTY;  Surgeon: Donato Heinz, MD;  Location: [facility] ORS;  Service: Orthopedics;  Laterality: Left;  . TOTAL KNEE ARTHROPLASTY Right 08/25/2014   Dr. Reita Chard   HPI: Pt is a 70 y.o. female with a known history of heart murmur, hypertension, arthritis, diverticulosis, gastroesophageal reflux disease, recent hip surgery, femur fracture, who was in and out from hospital and placement comes back to the hospital with weakness, inability to eat, weight loss, intermittent nausea, vomiting, feeling dizzy. In emergency room, she was noted to be hypothermic,  intermittently tachypneic, hypoxic, her labs revealed pyuria, mild CO2 retention to 62, low PCO2, however, patient's pH was normal. Patient complains of numerous multiple medical problems, weakness, pain in the sides in the back, weight loss, some cough with clear to yellow phlegm, nausea, vomiting, constipation, increased frequency of urination, urge incontinence, numbness from her face down to toes, inability to move up her lower extremities, inability to eat due to numbness in the mouth and so forth, however, during my presentation she is able to turn from one side to the other side in the bed and able to use her extremities, plus she speaks fluently. Extensive workup in [facility] with brain MRI, unremarkable electroencephalogram, unremarkable, MRI of cervical spine, revealed multilevel degenerative changes with moderate to marked neuroforaminal narrowing, mild spinal canal narrowing. Numerous other studies were performed in [facility] and [facility] over the past one year. Pt stated since March of 2017 (the cellulitis w/ admission), she has had deficits including numbness around the face and mouth, difficulty swallowing, and "some" Dysarthria. These issues are ongoing and current now. Pt does not appear aware of the degree of Dysarthria though. Pt has been taking some po's but w/ decreased oral intake overall and weight loss. Pt has been on a Dysphagia 2 diet w/ Nectar consistency liquids since admission d/t concern for dysphagia.   Subjective: pt awake, resting in bed.    Assessment / Plan / Recommendation  CHL IP CLINICAL IMPRESSIONS 02/02/2016  Therapy Diagnosis Mild oral phase dysphagia;Mild cervical esophageal phase dysphagia;Moderate pharyngeal  phase dysphagia;Severe pharyngeal phase dysphagia  Clinical Impression Pt presented w/ moderate-severe pharyngeal phase dysphagia, mild oral phase dysphagia, and mild cervical phase dysphagia during this evaluation today. During the pharyngeal phase of swallowing,  pt exhibited delayed pharyngeal swallow initiation, reduced hyolaryngeal excursion and reduced anterior movement, poor epiglottic movement w/ reduced inversion, and reduced airway protection/closure during the swallow. This resulted in laryngeal penetration and aspiration during the swallow; SILENT penetration/aspiration w/ a much delayed (mild) throat clear/cough response following. Pt appeared to best respond w/ Honey consistency liquids and purees w/ no immediate laryngeal penetration or aspiration noted to occur; a more timely pharyngeal swallow initiation was noted w/ these consistencies. During the oral phase, pt exhibited slight decreased bolus control w/ premature spillage, however, no laryngeal penetration or aspiration before the swallow. Noted minimal cervical Esophageal dysmotility intermittently during trials of puree; appeared to clear w/ time and alternating food/liquid boluses. Pt appears at increased risk for aspiration w/ increased risk for declined in Pulmonary status d/t the SILENT nature of the laryngeal penetration/aspiration. Pt may be at increased risk for ability to meet her nutrition/hydration needs adequately. Results were discussed w/ pt and NSG/MD.   Impact on safety and function Moderate aspiration risk;Severe aspiration risk      CHL IP TREATMENT RECOMMENDATION 02/02/2016  Treatment Recommendations Therapy as outlined in treatment plan below     Prognosis 02/02/2016  Prognosis for Safe Diet Advancement Guarded  Barriers to Reach Goals Time post onset;Severity of deficits  Barriers/Prognosis Comment --    CHL IP DIET RECOMMENDATION 02/02/2016  SLP Diet Recommendations Honey thick liquids;Dysphagia 2 (Fine chop) solids  Liquid Administration via Spoon;Cup  Medication Administration Whole meds with puree  Compensations Minimize environmental distractions;Slow rate;Small sips/bites;Multiple dry swallows after each bite/sip;Follow solids with liquid  Postural Changes  Remain semi-upright after after feeds/meals (Comment);Seated upright at 90 degrees      CHL IP OTHER RECOMMENDATIONS 02/02/2016  Recommended Consults Consider GI evaluation  Oral Care Recommendations Oral care BID;Staff/trained caregiver to provide oral care  Other Recommendations Order thickener from pharmacy;Prohibited food (jello, ice cream, thin soups);Remove water pitcher;Have oral suction available      CHL IP FOLLOW UP RECOMMENDATIONS 02/02/2016  Follow up Recommendations Skilled Nursing facility      [facility] IP FREQUENCY AND DURATION 02/02/2016  Speech Therapy Frequency (ACUTE ONLY) min 3x week  Treatment Duration 2 weeks           CHL IP ORAL PHASE 02/02/2016  Oral Phase Impaired  Oral - Pudding Teaspoon --  Oral - Pudding Cup --  Oral - Honey Teaspoon --  Oral - Honey Cup --  Oral - Nectar Teaspoon --  Oral - Nectar Cup --  Oral - Nectar Straw --  Oral - Thin Teaspoon --  Oral - Thin Cup --  Oral - Thin Straw --  Oral - Puree --  Oral - Mech Soft --  Oral - Regular --  Oral - Multi-Consistency --  Oral - Pill --  Oral Phase - Comment min reduced oral control of boluses - mostly liquids; premature spillage; labial residue    CHL IP PHARYNGEAL PHASE 02/02/2016  Pharyngeal Phase Impaired  Pharyngeal- Pudding Teaspoon --  Pharyngeal --  Pharyngeal- Pudding Cup --  Pharyngeal --  Pharyngeal- Honey Teaspoon --  Pharyngeal --  Pharyngeal- Honey Cup --  Pharyngeal --  Pharyngeal- Nectar Teaspoon --  Pharyngeal --  Pharyngeal- Nectar Cup --  Pharyngeal --  Pharyngeal- Nectar Straw --  Pharyngeal --  Pharyngeal-  Thin Teaspoon --  Pharyngeal --  Pharyngeal- Thin Cup --  Pharyngeal --  Pharyngeal- Thin Straw --  Pharyngeal --  Pharyngeal- Puree --  Pharyngeal --  Pharyngeal- Mechanical Soft --  Pharyngeal --  Pharyngeal- Regular --  Pharyngeal --  Pharyngeal- Multi-consistency --  Pharyngeal --  Pharyngeal- Pill --  Pharyngeal --  Pharyngeal Comment  delayed pharyngeal swallow; reduced hyolaryngeal excursion and reduced anterior movement; poor epiglottic movement/inversion; reduced airway protection/closure during the swallow; laryngeal penetration and aspiration during the swallow; SILENT penetration/aspiration.      CHL IP CERVICAL ESOPHAGEAL PHASE 02/02/2016  Cervical Esophageal Phase Impaired  Pudding Teaspoon --  Pudding Cup --  Honey Teaspoon --  Honey Cup --  Nectar Teaspoon --  Nectar Cup --  Nectar Straw --  Thin Teaspoon --  Thin Cup --  Thin Straw --  Puree --  Mechanical Soft --  Regular --  Multi-consistency --  Pill --  Cervical Esophageal Comment apparent slower motility and clearing in lower cervical (upper) esophagus    No flowsheet data found.   Jerilynn Som, MS, CCC-SLP Watson,Katherine 02/02/2016, 4:15 PM

## 2016-02-02 NOTE — Progress Notes (Signed)
[facility] [place] NAME: Nancy York    MR#:  [id number]  DATE OF BIRTH:  Sep 19, 1945  SUBJECTIVE:   Hypothermia resolved.  Still having significant LE weakness and going for MRI of there thoracic and Lumbar spine.  Family at bedside.  Also going for Modified barium swallow today.   REVIEW OF SYSTEMS:    Review of Systems  Constitutional: Negative for chills and fever.  HENT: Negative for congestion and tinnitus.   Eyes: Negative for blurred vision and double vision.  Respiratory: Negative for cough, shortness of breath and wheezing.   Cardiovascular: Negative for chest pain, orthopnea and PND.  Gastrointestinal: Negative for abdominal pain, diarrhea, nausea and vomiting.  Genitourinary: Negative for dysuria and hematuria.  Musculoskeletal: Positive for back pain.  Neurological: Positive for focal weakness (Lower ext. weakness. ) and weakness. Negative for dizziness and sensory change.  All other systems reviewed and are negative.   Nutrition: dysphagia II with nectar thick liquid Tolerating Diet: Yes but very little Tolerating PT: Eval noted.   DRUG ALLERGIES:   Allergies  Allergen Reactions  . Statins Other (See Comments)    Reaction:  Leg cramps   . Sulfa Antibiotics Nausea Only  . Other Rash    "leg cramps"  . Penicillins Rash and Other (See Comments)    Has patient had a PCN reaction causing immediate rash, facial/tongue/throat swelling, SOB or lightheadedness with hypotension: No Has patient had a PCN reaction causing severe rash involving mucus membranes or skin necrosis: No Has patient had a PCN reaction that required hospitalization No Has patient had a PCN reaction occurring within the last 10 years: Yes If all of the above answers are "NO", then may proceed with Cephalosporin use.  . Tape Rash    VITALS:  Blood pressure (!) 144/60, pulse (!) 104, temperature 97.9 F (36.6 C), temperature source Oral, resp. rate  16, height _0  (1.575 m), weight 53.5 kg (117 lb 14.4 oz), SpO2 98 %.  PHYSICAL EXAMINATION:   Physical Exam  GENERAL:  70 y.o.-year-old patient sitting up in chair with flat affect but in NAD.   EYES: Pupils equal, round, reactive to light and accommodation. No scleral icterus. Extraocular muscles intact.  HEENT: Head atraumatic, normocephalic. Oropharynx and nasopharynx clear.  NECK:  Supple, no jugular venous distention. No thyroid enlargement, no tenderness.  LUNGS: Normal breath sounds bilaterally, no wheezing, rales, rhonchi. No use of accessory muscles of respiration.  CARDIOVASCULAR: S1, S2 normal. No murmurs, rubs, or gallops.  ABDOMEN: Soft, nontender, nondistended. Bowel sounds present. No organomegaly or mass.  EXTREMITIES: No cyanosis, clubbing or edema b/l.    NEUROLOGIC: Cranial nerves II through XII are intact. No focal Motor or sensory deficits b/l.  Dense Lower ext weakness 2/5 strength b/l.       PSYCHIATRIC: The patient is alert and oriented x 3. Flat affect. SKIN: No obvious rash, lesion, or ulcer.    LABORATORY PANEL:   CBC  Recent Labs Lab 02/01/16 0430  WBC 9.8  HGB 11.6*  HCT 34.1*  PLT 312   ------------------------------------------------------------------------------------------------------------------  Chemistries   Recent Labs Lab 01/29/16 0838  02/01/16 0430  NA 135  < > 137  K 4.2  < > 4.1  CL 100*  < > 104  CO2 27  < > 29  GLUCOSE 102*  < > 97  BUN 11  < > 7  CREATININE 0.69  < > 0.43*  CALCIUM 9.8  < >  9.4  AST 24  --   --   ALT 19  --   --   ALKPHOS 108  --   --   BILITOT 0.4  --   --   < > = values in this interval not displayed. ------------------------------------------------------------------------------------------------------------------  Cardiac Enzymes  Recent Labs Lab 01/30/16 0713  TROPONINI <0.03    ------------------------------------------------------------------------------------------------------------------  RADIOLOGY:  No results found.   ASSESSMENT AND PLAN:   70 year old female with past medical history of GERD, hypertension, diverticulosis, osteoarthritis, who presented to the hospital yesterday due to weakness, difficulty eating, weight loss, intermittent nausea vomiting and feeling dizzy. She was noted to have urinary tract infection.  1. Sepsis-patient met sepsis criteria on admission given her hypothermia, tachycardia on admission along with abnormal urinalysis. - source is the urinary tract infection. Urine cultures positive for Citrobacter. -Continue Levaquin X 4 doses. Currently afebrile and hemodynamically stable.  2. Hypothermia - etiology unclear presently.  Much improved now.   - TSH, Cortisol level in the past has been normal.  Clinically stable now.   3. Urinary tract infection-source of patient's sepsis.  - cont. Levaquin.  Urine cultures + for Citrobacter and it's sensitive to it.   4. Difficulty ambulating, numbness, difficulty eating-patient has a conglomerate of neurological complaints and has had an extensive workup at [facility] and at [facility] which has been essentially benign.  -She's had a negative MRI head, negative EMG study, negative spinal tap.   - discussed plan with Neurology here (Dr. Doy Mince) and discussed w/ her extensively.  Pt. To get MRI Lumbar & thoracic spine to r/o Cord compression as a cause of her LE weakness but does not explain her other conglomerate of symptoms.  - pt. Will need extensive follow up with Neurology as outpatient.  - going to [facility] today and will need SNF upon discharge.   5. Back Pain - obtained Lumbar X-ray 2 days showing stable compression fractures but no other acute abnormality.  Cont. Supportive care.   6. Hx of Seizures - cont. Keppra  7. GERD - cont. Protonix.   8. Essential HTN - cont. Norvasc.   9.  Depression - appreciate Psych consult.  Cont. Cymbalta  10. Adult failure to thrive, dysphagia - going for MBS today and will start on diet based on speech recommendations.   - will get Calorie count and if not doing well consider PEG tube placement.  Discussed w/ Dietary and also family  All the records are reviewed and case discussed with Care Management/Social Worker. Management plans discussed with the patient, family and they are in agreement.  CODE STATUS: Full code  DVT Prophylaxis: Lovenox  TOTAL TIME TAKING CARE OF THIS PATIENT: 35 minutes.   POSSIBLE D/C IN 2-3 DAYS, DEPENDING ON CLINICAL CONDITION.   Henreitta Leber M.D on 02/02/2016 at 1:47 PM  Between 7am to 6pm - Pager - [phone]  After 6pm go to [email or web address] - Proofreader  [facility]  Office  [phone]  CC: Primary care physician; Idelle Crouch, MD

## 2016-02-02 NOTE — Discharge Summary (Signed)
[facility] [place] NAME: Nancy York    MR#:  [id number]  DATE OF BIRTH:  1945/09/26  DATE OF ADMISSION:  01/29/2016 ADMITTING PHYSICIAN: Theodoro Grist, MD  DATE OF DISCHARGE: 02/02/2016  PRIMARY CARE PHYSICIAN: SPARKS,JEFFREY D, MD    ADMISSION DIAGNOSIS:  Generalized muscle weakness [M62.81] Acute respiratory failure with hypoxia (HCC) [J96.01] Hypothermia, initial encounter [T68.XXXA]  DISCHARGE DIAGNOSIS:  Principal Problem:   Sepsis ([place]) Active Problems:   Generalized weakness   GERD (gastroesophageal reflux disease)   HTN (hypertension)   Facial numbness   B12 deficiency   Hypothermia   UTI (urinary tract infection)   Hypercapnia   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 Arthritis    joints, shoulder and knees  . Diverticulosis   . GERD (gastroesophageal reflux disease)   . Heart murmur    no doctor follows her for this  . Hypertension     HOSPITAL COURSE:   70 year old female with past medical history of GERD, hypertension, diverticulosis, osteoarthritis, who presented to the hospital yesterday due to weakness, difficulty eating, weight loss, intermittent nausea vomiting and feeling dizzy. She was noted to have urinary tract infection.  1. Sepsis-patient met sepsis criteria on admission given her hypothermia, tachycardia on admission along with abnormal urinalysis. - source is the urinary tract infection. Urine cultures was positive for Citrobacter. -Continue Levaquin. Currently afebrile and hemodynamically stable.  2. Hypothermia - etiology unclear presently.  Much improved now.   - TSH, Cortisol level in the past has been normal.  Clinically stable now.   3. Urinary tract infection-source of patient's sepsis.  - cont. Levaquin.   Urine cultures + for Citrobacter and it's sensitive to it.   4. Difficulty ambulating, numbness, difficulty eating-patient has a conglomerate of neurological complaints and has had an extensive workup at [facility] and at [facility] which has been essentially benign.  -She's had a negative MRI head, negative EMG study, negative spinal tap.   -Patient was seen by neurology and they recommended doing a MRI of the thoracic and lumbar spine. The lumbar spine MRI showed severe spinal stenosis at multiple levels but the MRI of the thoracic spine shows acute compression of the T9 vertebral body with bony retropulsion and also a epidural hematoma extending from T8 through T9-T10 with resultant severe canal stenosis. -Based on these findings as mentioned above patient is to be transferred to tertiary care Center for neurosurgical evaluation. I have placed a call out to [facility] and I'm awaiting a call back to see if they would accept the patient. I have also notified the family about the MRI results and also plans for disposition. -Patient underwent a modified barium swallow today and was placed on a dysphagia III with honey thick liquid diet with strict aspiration precautions.  5. Back Pain - due to spinal stenosis and compression fracture as seen on MRI.  - cont. Supportive care w/ pain control with Morphine.   6. Hx of Seizures - cont. Keppra  7. GERD - cont. Protonix.   8. Essential HTN - cont. Norvasc.   9. Depression - appreciate Psych consult.  Cont. Cymbalta  10. Adult failure to thrive, dysphagia - went for MBS today and placed on dysphagia 2 diet w/ Honey thick liquids.    DISCHARGE CONDITIONS:   Stable.  CONSULTS OBTAINED:  Treatment Team:  Hildred Priest, MD Gonzella Lex, MD Catarina Hartshorn, MD Alexis Goodell, MD  DRUG ALLERGIES:   Allergies  Allergen Reactions  . Statins Other (See Comments)    Reaction:  Leg cramps   . Sulfa Antibiotics Nausea  Only  . Other Rash    "leg cramps"  . Penicillins Rash and Other (See Comments)    Has patient had a PCN reaction causing immediate rash, facial/tongue/throat swelling, SOB or lightheadedness with hypotension: No Has patient had a PCN reaction causing severe rash involving mucus membranes or skin necrosis: No Has patient had a PCN reaction that required hospitalization No Has patient had a PCN reaction occurring within the last 10 years: Yes If all of the above answers are "NO", then may proceed with Cephalosporin use.  . Tape Rash    DISCHARGE MEDICATIONS:     Medication List    TAKE these medications   acetaminophen 325 MG tablet Commonly known as:  TYLENOL Take 1 tablet (325 mg total) by mouth every 6 (six) hours as needed for mild pain (or Fever >/= 101).   ACIPHEX 20 MG tablet Generic drug:  RABEprazole Take 20 mg by mouth 2 (two) times daily.   aspirin EC 81 MG tablet Take 81 mg by mouth every morning.   cetirizine 10 MG tablet Commonly known as:  ZYRTEC Take 10 mg by mouth daily as needed for allergies.   DULoxetine 30 MG capsule Commonly known as:  CYMBALTA Take 1 capsule (30 mg total) by mouth daily. Start taking on:  02/03/2016   levETIRAcetam 750 MG tablet Commonly known as:  KEPPRA Take 750 mg by mouth 2 (two) times daily.   LORazepam 0.5 MG tablet Commonly known as:  ATIVAN Take 1 tablet (0.5 mg total) by mouth daily as needed for anxiety.   meclizine 25 MG tablet Commonly known as:  ANTIVERT Take 25 mg by mouth 3 (three) times daily as needed for dizziness.   nystatin 100000 UNIT/ML suspension Commonly known as:  MYCOSTATIN Take 5 mLs by mouth 4 (four) times daily.   nystatin cream Commonly known as:  MYCOSTATIN Apply 1 application topically daily as needed for dry skin.   ondansetron 8 MG disintegrating tablet Commonly known as:  ZOFRAN-ODT Take 8 mg by mouth every 8 (eight) hours as needed for nausea or vomiting. Reported on 09/29/2015    oxyCODONE-acetaminophen 5-325 MG tablet Commonly known as:  PERCOCET/ROXICET Take 1 tablet by mouth every 6 (six) hours as needed for moderate pain.   polyethylene glycol packet Commonly known as:  MIRALAX / GLYCOLAX Take 17 g by mouth daily as needed for mild constipation.   potassium chloride SA 20 MEQ tablet Commonly known as:  K-DUR,KLOR-CON Take 20 mEq by mouth at bedtime.   ranitidine 150 MG tablet Commonly known as:  ZANTAC Take 150 mg by mouth 2 (two) times daily as needed for heartburn.   vitamin B-12 1000 MCG tablet Commonly known as:  CYANOCOBALAMIN Take 1,000 mcg by mouth every morning.   Vitamin D (Ergocalciferol) 50000 units Caps capsule Commonly known as:  DRISDOL Take 50,000 Units by mouth every 7 (seven) days. Pt takes on Sunday.         DISCHARGE INSTRUCTIONS:   DIET:  Dysphagia II with Honey thick liquids  DISCHARGE CONDITION:  Stable  ACTIVITY:  Activity as tolerated  OXYGEN:  Home Oxygen: No.   Oxygen Delivery: None  DISCHARGE LOCATION:  [facility].    If you  experience worsening of your admission symptoms, develop shortness of breath, life threatening emergency, suicidal or homicidal thoughts you must seek medical attention immediately by calling 911 or calling your MD immediately  if symptoms less severe.  You Must read complete instructions/literature along with all the possible adverse reactions/side effects for all the Medicines you take and that have been prescribed to you. Take any new Medicines after you have completely understood and accpet all the possible adverse reactions/side effects.   Please note  You were cared for by a hospitalist during your hospital stay. If you have any questions about your discharge medications or the care you received while you were in the hospital after you are discharged, you can call the unit and asked to speak with the hospitalist on call if the hospitalist that took care of you is not  available. Once you are discharged, your primary care physician will handle any further medical issues. Please note that NO REFILLS for any discharge medications will be authorized once you are discharged, as it is imperative that you return to your primary care physician (or establish a relationship with a primary care physician if you do not have one) for your aftercare needs so that they can reassess your need for medications and monitor your lab values.   Microbiology Results  Results for orders placed or performed during the hospital encounter of 01/29/16  Culture, blood (routine x 2)     Status: None (Preliminary result)   Collection Time: 01/29/16  1:51 PM  Result Value Ref Range Status   Specimen Description BLOOD LEFTSM FA  Final   Special Requests BOTTLES DRAWN AEROBIC AND ANAEROBIC ANA6CC AER [employer]  Final   Culture NO GROWTH 4 DAYS  Final   Report Status PENDING  Incomplete  Culture, blood (routine x 2)     Status: None (Preliminary result)   Collection Time: 01/29/16  1:51 PM  Result Value Ref Range Status   Specimen Description BLOOD RIGHT SM AC  Final   Special Requests BOTTLES DRAWN AEROBIC AND ANAEROBIC [place]  Final   Culture NO GROWTH 4 DAYS  Final   Report Status PENDING  Incomplete  Urine culture     Status: Abnormal   Collection Time: 01/29/16  4:13 PM  Result Value Ref Range Status   Specimen Description URINE, RANDOM  Final   Special Requests NONE  Final   Culture >=100,000 COLONIES/mL CITROBACTER BRAAKII (A)  Final   Report Status 01/31/2016 FINAL  Final   Organism ID, Bacteria CITROBACTER BRAAKII (A)  Final      Susceptibility   Citrobacter braakii - MIC*    CEFAZOLIN >=64 RESISTANT Resistant     CEFTRIAXONE <=1 SENSITIVE Sensitive     CIPROFLOXACIN <=0.25 SENSITIVE Sensitive     GENTAMICIN <=1 SENSITIVE Sensitive     IMIPENEM <=0.25 SENSITIVE Sensitive     NITROFURANTOIN 32 SENSITIVE Sensitive     TRIMETH/SULFA <=20 SENSITIVE Sensitive     PIP/TAZO <=4  SENSITIVE Sensitive     * >=100,000 COLONIES/mL CITROBACTER BRAAKII  MRSA PCR Screening     Status: Abnormal   Collection Time: 01/29/16  8:25 PM  Result Value Ref Range Status   MRSA by PCR POSITIVE (A) NEGATIVE Final    Comment:        The GeneXpert MRSA Assay (FDA approved for NASAL specimens only), is one component of a comprehensive MRSA colonization surveillance program. It is not intended to diagnose MRSA infection nor to guide or monitor treatment  for MRSA infections. RESULT CALLED TO, READ BACK BY AND VERIFIED WITH: Johnnye Lana WARD AT 2224 ON 01/29/16 RWW     RADIOLOGY:  Mr Thoracic Spine Wo Contrast  Result Date: 02/02/2016 CLINICAL DATA:  Initial evaluation for inability to ambulate. Shown to have fractures on prior CT from 01/29/2016. EXAM: MRI THORACIC SPINE WITHOUT CONTRAST TECHNIQUE: Multiplanar, multisequence MR imaging of the thoracic spine was performed. No intravenous contrast was administered. COMPARISON:  Prior CT from 01/29/2016. FINDINGS: Alignment: Vertebral bodies are normally aligned with preservation of the normal thoracic kyphosis. No listhesis. Vertebrae: There is an acute appearing fracture of the T5 vertebral body. Associated approximately 50% height loss with trace 3 mm bony retropulsion. No significant canal stenosis. This fracture is benign/osteoporotic in appearance. Additional acute compression fracture involving the T9 vertebral body. Associated approximately 70% height loss. Approximately 4 mm bony retropulsion at the posterior aspect of the T9 inferior endplate. Superimposed heterogeneous T1/T2 signal intensity within the epidural space extending from T8 through T9-10 favored to reflect associated hemorrhage/hematoma (series 5, image 6). Superimposed facet disease present at this level, particularly on the left. There is resultant severe canal stenosis, with the thecal sac measuring approximately 7 x 6 mm at its most narrow point (AP by transverse).  This is at the inferior aspect of T9 (series 7, image 28, 29). Associated edema within the paraspinous soft tissues noted adjacent to the T8 through T10 vertebral bodies. No underlying abnormal STIR signal intensity within the T8-9 or T9-10 disc to suggest discitis/osteomyelitis. This fracture is also favored to be benign/osteoporotic in nature. Compression deformity extending through the inferior endplate of H20 with up to 70% height loss is chronic in nature. 3 mm bony retropulsion without stenosis. Degenerative Schmorl's node through the superior endplate of L2 with associated height loss also chronic in nature. Vertebral body heights otherwise maintained. Other than the fractures, the signal intensity within the vertebral body bone marrow is within normal limits. Probable benign hemangiomas noted within the T3 and T10 vertebral bodies. Cord: Signal intensity within the thoracic spinal cord is within normal limits. No definite cord signal changes seen at the level of the T9 fracture, although evaluation somewhat in all due to adjacent epidural hematoma and motion artifact. Paraspinal and other soft tissues: Paraspinous edema seen adjacent to the T9 fracture, extending from T7-8 through T10-11. Paraspinous soft tissues otherwise within normal limits. Small layering bilateral pleural effusions noted. Visualized visceral structures are unremarkable. Disc levels: T4-5:  Mild degenerative disc bulge without significant stenosis. T5-6: Minimal 3 mm bony retropulsion at the inferior aspect of the T5 inferior endplate related to the compression fracture. No significant stenosis. Mild posterior element hypertrophy. T8-9: No significant disc bulge. Posterior element hypertrophy. Small amount of epidural hematoma related to the T9 fracture with resultant mild canal stenosis. T9-10: T9 compression fracture with 4 mm bony retropulsion, epidural hematoma, and facet hypertrophy. Resultant severe canal stenosis. T11-12: Mild  diffuse disc bulge with disc desiccation. No significant stenosis. T12-L1: Mild diffuse degenerative disc bulge with disc desiccation. Mild facet hypertrophy. No significant stenosis. IMPRESSION: 1. Acute compression fracture involving the T9 vertebral body with up to 70% height loss and 4 mm bony retropulsion. Associated epidural hematoma extending from T8 through T9-10 with resultant severe canal stenosis. No definite cord signal changes. This fracture is favored to be posttraumatic and/or osteoporotic in nature. Note is made of a fair amount of paraspinous soft tissue edema about the fracture, somewhat unusual, but still favored to be posttraumatic in  nature. No definite evidence for infection, although if the patient has elevated white count with fever, etc, this could conceivably be considered. 2. Acute compression fracture involving the T5 vertebral body with approximately 50% height loss and 3 mm bony retropulsion. No significant stenosis. 3. Stable chronic compression deformity of T12 without stenosis. 4. Mild multilevel degenerative spondylolysis as detailed above. No other significant stenosis. 5. Small layering bilateral pleural effusions. Critical Value/emergent results were called by telephone at the time of interpretation on 02/02/2016 at 3:05 pm to Dr. Alexis Goodell , who verbally acknowledged these results. Electronically Signed   By: Jeannine Boga M.D.   On: 02/02/2016 15:58   Mr Lumbar Spine Wo Contrast  Result Date: 02/02/2016 CLINICAL DATA:  Lower extremity weakness. EXAM: MRI LUMBAR SPINE WITHOUT CONTRAST TECHNIQUE: Multiplanar, multisequence MR imaging of the lumbar spine was performed. No intravenous contrast was administered. COMPARISON:  None. FINDINGS: Segmentation:  Standard. Alignment:  Physiologic. Vertebrae: No acute fracture, evidence of discitis, or bone lesion. Chronic T12, L2 and L3 vertebral body compression fractures. Mild degenerative changes of bilateral sacroiliac  joints. Conus medullaris: Extends to the L1 level and appears normal. Paraspinal and other soft tissues: No focal paraspinal abnormality. Disc levels: Disc spaces: Degenerative disc disease with disc height loss at T12-L1 and L4-5. Disc desiccation throughout the remainder of the lumbar spine. T12-L1: Mild broad-based disc bulge flattening the ventral thecal sac with bilateral lateral recess narrowing. Mild bilateral facet arthropathy. Mild bilateral foraminal narrowing. No central canal stenosis. L1-L2: Mild broad-based disc bulge. Mild bilateral facet arthropathy. Bilateral lateral recess narrowing and mild spinal stenosis. No evidence of neural foraminal stenosis. L2-L3: Broad-based disc bulge eccentric towards the left. Moderate bilateral facet arthropathy. Severe spinal stenosis and bilateral lateral recess stenosis. Mild bilateral foraminal stenosis. L3-L4: Mild broad-based disc bulge flattening the ventral thecal sac. Moderate bilateral facet arthropathy. Bilateral lateral recess stenosis and moderate spinal stenosis. No evidence of neural foraminal stenosis. L4-L5: Broad-based disc bulge with a central disc protrusion. Moderate bilateral facet arthropathy. Moderate spinal stenosis and bilateral lateral recess stenosis. Mild left foraminal stenosis. L5-S1: Broad-based disc bulge. Severe bilateral facet arthropathy. No evidence of neural foraminal stenosis. No central canal stenosis. IMPRESSION: 1. At L2-3 there is a broad-based disc bulge eccentric towards the left. Moderate bilateral facet arthropathy. Severe spinal stenosis and bilateral lateral recess stenosis. Mild bilateral foraminal stenosis. 2. At L3-4 there is a mild broad-based disc bulge flattening the ventral thecal sac. Moderate bilateral facet arthropathy. Bilateral lateral recess stenosis and moderate spinal stenosis. 3. At L4-5 there is a broad-based disc bulge with a central disc protrusion. Moderate bilateral facet arthropathy. Moderate  spinal stenosis and bilateral lateral recess stenosis. Mild left foraminal stenosis. 4. No evidence of osseous metastatic disease of the lumbar spine. Electronically Signed   By: Kathreen Devoid   On: 02/02/2016 14:18      Management plans discussed with the patient, family and they are in agreement.  CODE STATUS:     Code Status Orders        Start     Ordered   01/29/16 1947  Full code  Continuous     01/29/16 1946    Code Status History    Date Active Date Inactive Code Status Order ID Comments User Context   12/26/2015  2:21 PM 12/28/2015  5:30 PM Full Code [id number]  Baxter Hire, MD Inpatient   09/22/2015  8:13 PM 09/26/2015  7:45 PM Full Code [id number]  Bettey Costa, MD Inpatient  07/18/2015 10:41 PM 07/21/2015  7:44 PM Full Code [id number]  Lance Coon, MD Inpatient   08/25/2014  7:10 PM 08/28/2014  3:08 PM Full Code [id number]  Dereck Leep, MD Inpatient   08/25/2014  7:10 PM 08/25/2014  7:10 PM Full Code [id number]  Dereck Leep, MD Inpatient    Advance Directive Documentation   Flowsheet Row Most Recent Value  Type of Advance Directive  Living will, Healthcare Power of Attorney  Pre-existing out of facility DNR order (yellow form or pink MOST form)  No data  "MOST" Form in Place?  No data      TOTAL TIME TAKING CARE OF THIS PATIENT: 60 minutes.    Henreitta Leber M.D on 02/02/2016 at 4:25 PM  Between 7am to 6pm - Pager - [phone]  After 6pm go to [email or web address] - Proofreader  [facility]  Office  [phone]  CC: Primary care physician; Idelle Crouch, MD

## 2016-02-03 LAB — CULTURE, BLOOD (ROUTINE X 2)
Culture: NO GROWTH
Culture: NO GROWTH

## 2016-02-15 ENCOUNTER — Encounter
Admission: RE | Admit: 2016-02-15 | Discharge: 2016-02-15 | Disposition: A | Discharge status: Home / Self Care | Payer: Commercial Managed Care - HMO | Source: Ambulatory Visit | Attending: Internal Medicine | Admitting: Internal Medicine

## 2016-02-15 DIAGNOSIS — R05 Cough: Secondary | ICD-10-CM | POA: Insufficient documentation

## 2016-02-15 DIAGNOSIS — R410 Disorientation, unspecified: Secondary | ICD-10-CM | POA: Insufficient documentation

## 2016-02-19 DIAGNOSIS — R05 Cough: Secondary | ICD-10-CM | POA: Diagnosis not present

## 2016-02-19 DIAGNOSIS — R410 Disorientation, unspecified: Secondary | ICD-10-CM | POA: Diagnosis not present

## 2016-02-19 LAB — URINALYSIS COMPLETE WITH MICROSCOPIC (ARMC ONLY)
BACTERIA UA: NONE SEEN
BILIRUBIN URINE: NEGATIVE
Glucose, UA: NEGATIVE mg/dL
Hgb urine dipstick: NEGATIVE
NITRITE: NEGATIVE
PROTEIN: 100 mg/dL — AB
SPECIFIC GRAVITY, URINE: 1.024 (ref 1.005–1.030)
Squamous Epithelial / LPF: NONE SEEN
pH: 5 (ref 5.0–8.0)

## 2016-02-21 LAB — URINE CULTURE

## 2016-02-22 ENCOUNTER — Non-Acute Institutional Stay (SKILLED_NURSING_FACILITY): Payer: Commercial Managed Care - HMO | Admitting: Gerontology

## 2016-02-22 DIAGNOSIS — R059 Cough, unspecified: Secondary | ICD-10-CM

## 2016-02-22 DIAGNOSIS — R05 Cough: Secondary | ICD-10-CM | POA: Diagnosis not present

## 2016-02-22 LAB — URINALYSIS COMPLETE WITH MICROSCOPIC (ARMC ONLY)
Bilirubin Urine: NEGATIVE
Glucose, UA: NEGATIVE mg/dL
NITRITE: NEGATIVE
PROTEIN: 30 mg/dL — AB
SPECIFIC GRAVITY, URINE: 1.025 (ref 1.005–1.030)
pH: 5 (ref 5.0–8.0)

## 2016-02-22 LAB — COMPREHENSIVE METABOLIC PANEL
ALT: 10 U/L — ABNORMAL LOW (ref 14–54)
AST: 23 U/L (ref 15–41)
Albumin: 2.5 g/dL — ABNORMAL LOW (ref 3.5–5.0)
Alkaline Phosphatase: 142 U/L — ABNORMAL HIGH (ref 38–126)
Anion gap: 9 (ref 5–15)
BUN: 15 mg/dL (ref 6–20)
CHLORIDE: 96 mmol/L — AB (ref 101–111)
CO2: 29 mmol/L (ref 22–32)
Calcium: 10.5 mg/dL — ABNORMAL HIGH (ref 8.9–10.3)
Creatinine, Ser: 0.68 mg/dL (ref 0.44–1.00)
GFR calc Af Amer: >60 mL/min (ref >60)
Glucose, Bld: 74 mg/dL (ref 65–99)
POTASSIUM: 4 mmol/L (ref 3.5–5.1)
Sodium: 134 mmol/L — ABNORMAL LOW (ref 135–145)
Total Bilirubin: 0.4 mg/dL (ref 0.3–1.2)
Total Protein: 6.5 g/dL (ref 6.5–8.1)

## 2016-02-22 LAB — CBC WITH DIFFERENTIAL/PLATELET
BASOS ABS: 0 10*3/uL (ref 0–0.1)
BASOS PCT: 0 %
EOS ABS: 0.1 10*3/uL (ref 0–0.7)
EOS PCT: 1 %
HCT: 32.1 % — ABNORMAL LOW (ref 35.0–47.0)
Hemoglobin: 10.4 g/dL — ABNORMAL LOW (ref 12.0–16.0)
LYMPHS PCT: 33 %
Lymphs Abs: 2.7 10*3/uL (ref 1.0–3.6)
MCH: 27.5 pg (ref 26.0–34.0)
MCHC: 32.4 g/dL (ref 32.0–36.0)
MCV: 84.9 fL (ref 80.0–100.0)
MONO ABS: 0.5 10*3/uL (ref 0.2–0.9)
Monocytes Relative: 6 %
Neutro Abs: 4.8 10*3/uL (ref 1.4–6.5)
Neutrophils Relative %: 60 %
PLATELETS: 660 10*3/uL — AB (ref 150–440)
RBC: 3.79 MIL/uL — ABNORMAL LOW (ref 3.80–5.20)
RDW: 16.7 % — AB (ref 11.5–14.5)
WBC: 8.2 10*3/uL (ref 3.6–11.0)

## 2016-02-23 DIAGNOSIS — Z515 Encounter for palliative care: Secondary | ICD-10-CM | POA: Diagnosis not present

## 2016-02-23 DIAGNOSIS — R627 Adult failure to thrive: Secondary | ICD-10-CM | POA: Diagnosis not present

## 2016-02-23 DIAGNOSIS — Z66 Do not resuscitate: Secondary | ICD-10-CM | POA: Diagnosis not present

## 2016-02-23 DIAGNOSIS — R532 Functional quadriplegia: Secondary | ICD-10-CM | POA: Diagnosis not present

## 2016-02-23 LAB — URINE CULTURE

## 2016-02-24 ENCOUNTER — Emergency Department: Payer: Commercial Managed Care - HMO

## 2016-02-24 ENCOUNTER — Encounter
Admission: RE | Admit: 2016-02-24 | Discharge: 2016-02-24 | Disposition: A | Discharge status: Home / Self Care | Payer: Commercial Managed Care - HMO | Source: Ambulatory Visit | Attending: Internal Medicine | Admitting: Internal Medicine

## 2016-02-24 ENCOUNTER — Encounter: Payer: Self-pay | Admitting: Emergency Medicine

## 2016-02-24 ENCOUNTER — Inpatient Hospital Stay
Admission: EM | Admit: 2016-02-24 | Discharge: 2016-02-25 | DRG: 872 | Disposition: A | Discharge status: Hospice | Payer: Commercial Managed Care - HMO | Attending: Internal Medicine | Admitting: Internal Medicine

## 2016-02-24 DIAGNOSIS — Z66 Do not resuscitate: Secondary | ICD-10-CM | POA: Diagnosis present

## 2016-02-24 DIAGNOSIS — Z96642 Presence of left artificial hip joint: Secondary | ICD-10-CM | POA: Diagnosis present

## 2016-02-24 DIAGNOSIS — I509 Heart failure, unspecified: Secondary | ICD-10-CM | POA: Diagnosis present

## 2016-02-24 DIAGNOSIS — R4182 Altered mental status, unspecified: Secondary | ICD-10-CM | POA: Diagnosis not present

## 2016-02-24 DIAGNOSIS — Z515 Encounter for palliative care: Secondary | ICD-10-CM | POA: Diagnosis present

## 2016-02-24 DIAGNOSIS — Z833 Family history of diabetes mellitus: Secondary | ICD-10-CM | POA: Diagnosis not present

## 2016-02-24 DIAGNOSIS — Z96651 Presence of right artificial knee joint: Secondary | ICD-10-CM | POA: Diagnosis present

## 2016-02-24 DIAGNOSIS — R778 Other specified abnormalities of plasma proteins: Secondary | ICD-10-CM

## 2016-02-24 DIAGNOSIS — Z79818 Long term (current) use of other agents affecting estrogen receptors and estrogen levels: Secondary | ICD-10-CM | POA: Diagnosis not present

## 2016-02-24 DIAGNOSIS — E785 Hyperlipidemia, unspecified: Secondary | ICD-10-CM | POA: Diagnosis present

## 2016-02-24 DIAGNOSIS — Z7982 Long term (current) use of aspirin: Secondary | ICD-10-CM | POA: Diagnosis not present

## 2016-02-24 DIAGNOSIS — M4800 Spinal stenosis, site unspecified: Secondary | ICD-10-CM | POA: Diagnosis present

## 2016-02-24 DIAGNOSIS — M1990 Unspecified osteoarthritis, unspecified site: Secondary | ICD-10-CM | POA: Diagnosis present

## 2016-02-24 DIAGNOSIS — E440 Moderate protein-calorie malnutrition: Secondary | ICD-10-CM | POA: Diagnosis present

## 2016-02-24 DIAGNOSIS — R7989 Other specified abnormal findings of blood chemistry: Secondary | ICD-10-CM

## 2016-02-24 DIAGNOSIS — R0902 Hypoxemia: Secondary | ICD-10-CM | POA: Diagnosis present

## 2016-02-24 DIAGNOSIS — A419 Sepsis, unspecified organism: Secondary | ICD-10-CM | POA: Diagnosis not present

## 2016-02-24 DIAGNOSIS — R627 Adult failure to thrive: Secondary | ICD-10-CM | POA: Diagnosis present

## 2016-02-24 DIAGNOSIS — E871 Hypo-osmolality and hyponatremia: Secondary | ICD-10-CM | POA: Diagnosis present

## 2016-02-24 DIAGNOSIS — N390 Urinary tract infection, site not specified: Secondary | ICD-10-CM | POA: Diagnosis present

## 2016-02-24 DIAGNOSIS — R6520 Severe sepsis without septic shock: Secondary | ICD-10-CM | POA: Diagnosis present

## 2016-02-24 DIAGNOSIS — L8990 Pressure ulcer of unspecified site, unspecified stage: Secondary | ICD-10-CM | POA: Diagnosis present

## 2016-02-24 DIAGNOSIS — K219 Gastro-esophageal reflux disease without esophagitis: Secondary | ICD-10-CM | POA: Diagnosis present

## 2016-02-24 DIAGNOSIS — Z6820 Body mass index (BMI) 20.0-20.9, adult: Secondary | ICD-10-CM | POA: Diagnosis not present

## 2016-02-24 DIAGNOSIS — Z880 Allergy status to penicillin: Secondary | ICD-10-CM | POA: Diagnosis not present

## 2016-02-24 DIAGNOSIS — I110 Hypertensive heart disease with heart failure: Secondary | ICD-10-CM | POA: Diagnosis present

## 2016-02-24 DIAGNOSIS — Z936 Other artificial openings of urinary tract status: Secondary | ICD-10-CM | POA: Diagnosis not present

## 2016-02-24 DIAGNOSIS — Z882 Allergy status to sulfonamides status: Secondary | ICD-10-CM

## 2016-02-24 LAB — BRAIN NATRIURETIC PEPTIDE: B Natriuretic Peptide: 41 pg/mL (ref 0.0–100.0)

## 2016-02-24 LAB — CBC WITH DIFFERENTIAL/PLATELET
BASOS PCT: 1 %
Basophils Absolute: 0.1 10*3/uL (ref 0–0.1)
EOS ABS: 0 10*3/uL (ref 0–0.7)
Eosinophils Relative: 0 %
HEMATOCRIT: 36.1 % (ref 35.0–47.0)
HEMOGLOBIN: 11.3 g/dL — AB (ref 12.0–16.0)
LYMPHS ABS: 1.7 10*3/uL (ref 1.0–3.6)
Lymphocytes Relative: 9 %
MCH: 26.7 pg (ref 26.0–34.0)
MCHC: 31.1 g/dL — ABNORMAL LOW (ref 32.0–36.0)
MCV: 85.9 fL (ref 80.0–100.0)
Monocytes Absolute: 0.8 10*3/uL (ref 0.2–0.9)
Monocytes Relative: 4 %
NEUTROS ABS: 16.2 10*3/uL — AB (ref 1.4–6.5)
NEUTROS PCT: 86 %
Platelets: 643 10*3/uL — ABNORMAL HIGH (ref 150–440)
RBC: 4.21 MIL/uL (ref 3.80–5.20)
RDW: 16.8 % — ABNORMAL HIGH (ref 11.5–14.5)
WBC: 18.9 10*3/uL — AB (ref 3.6–11.0)

## 2016-02-24 LAB — URINALYSIS COMPLETE WITH MICROSCOPIC (ARMC ONLY)
BILIRUBIN URINE: NEGATIVE
Glucose, UA: NEGATIVE mg/dL
NITRITE: NEGATIVE
PROTEIN: 100 mg/dL — AB
SPECIFIC GRAVITY, URINE: 1.029 (ref 1.005–1.030)
pH: 5 (ref 5.0–8.0)

## 2016-02-24 LAB — COMPREHENSIVE METABOLIC PANEL
ALBUMIN: 2.8 g/dL — AB (ref 3.5–5.0)
ALK PHOS: 134 U/L — AB (ref 38–126)
ALT: 12 U/L — AB (ref 14–54)
AST: 26 U/L (ref 15–41)
Anion gap: 12 (ref 5–15)
BUN: 27 mg/dL — AB (ref 6–20)
CALCIUM: 11.6 mg/dL — AB (ref 8.9–10.3)
CO2: 28 mmol/L (ref 22–32)
CREATININE: 0.74 mg/dL (ref 0.44–1.00)
Chloride: 100 mmol/L — ABNORMAL LOW (ref 101–111)
GFR calc Af Amer: >60 mL/min (ref >60)
GFR calc non Af Amer: >60 mL/min (ref >60)
GLUCOSE: 106 mg/dL — AB (ref 65–99)
Potassium: 4.6 mmol/L (ref 3.5–5.1)
SODIUM: 140 mmol/L (ref 135–145)
Total Bilirubin: 0.9 mg/dL (ref 0.3–1.2)
Total Protein: 7.1 g/dL (ref 6.5–8.1)

## 2016-02-24 LAB — INFLUENZA PANEL BY PCR (TYPE A & B)
Influenza A By PCR: NEGATIVE
Influenza B By PCR: NEGATIVE

## 2016-02-24 LAB — TROPONIN I
Troponin I: 0.39 ng/mL (ref <0.03)
Troponin I: 0.59 ng/mL (ref <0.03)

## 2016-02-24 LAB — LACTIC ACID, PLASMA: LACTIC ACID, VENOUS: 1.4 mmol/L (ref 0.5–1.9)

## 2016-02-24 LAB — MAGNESIUM: Magnesium: 1.7 mg/dL (ref 1.7–2.4)

## 2016-02-24 MED ORDER — SODIUM CHLORIDE 0.9 % IV BOLUS (SEPSIS)
1000.0000 mL | Freq: Once | INTRAVENOUS | Status: AC
  Administered 2016-02-24: 1000 mL via INTRAVENOUS

## 2016-02-24 MED ORDER — LEVETIRACETAM 750 MG PO TABS: 1500.0000 mg | ORAL_TABLET | Freq: Two times a day (BID) | ORAL | Status: DC

## 2016-02-24 MED ORDER — VANCOMYCIN HCL IN DEXTROSE 750-5 MG/150ML-% IV SOLN
750.0000 mg | INTRAVENOUS | Status: DC
  Filled 2016-02-24: qty 150

## 2016-02-24 MED ORDER — SODIUM CHLORIDE 0.9 % IV BOLUS (SEPSIS)
500.0000 mL | Freq: Once | INTRAVENOUS | Status: AC
  Administered 2016-02-24: 500 mL via INTRAVENOUS

## 2016-02-24 MED ORDER — VANCOMYCIN HCL IN DEXTROSE 750-5 MG/150ML-% IV SOLN
750.0000 mg | Freq: Once | INTRAVENOUS | Status: DC
  Filled 2016-02-24: qty 150

## 2016-02-24 MED ORDER — LORATADINE 10 MG PO TABS: 10.0000 mg | ORAL_TABLET | Freq: Every day | ORAL | Status: DC

## 2016-02-24 MED ORDER — GLUCOSAMINE-CHONDROITIN 250-200 MG PO TABS: 2.0000 | ORAL_TABLET | Freq: Every day | ORAL | Status: DC

## 2016-02-24 MED ORDER — ENOXAPARIN SODIUM 60 MG/0.6ML ~~LOC~~ SOLN: 1.0000 mg/kg | Freq: Two times a day (BID) | SUBCUTANEOUS | Status: DC

## 2016-02-24 MED ORDER — CALCIUM CARBONATE-VITAMIN D 500-200 MG-UNIT PO TABS: 1.0000 | ORAL_TABLET | Freq: Two times a day (BID) | ORAL | Status: DC

## 2016-02-24 MED ORDER — SODIUM CHLORIDE 0.9 % IV SOLN
INTRAVENOUS | Status: DC
  Administered 2016-02-24 – 2016-02-25 (×2): via INTRAVENOUS

## 2016-02-24 MED ORDER — CEFTRIAXONE SODIUM-DEXTROSE 1-3.74 GM-% IV SOLR
1.0000 g | INTRAVENOUS | Status: DC
  Administered 2016-02-24 – 2016-02-25 (×2): 1 g via INTRAVENOUS
  Filled 2016-02-24 (×2): qty 50

## 2016-02-24 MED ORDER — SENNOSIDES-DOCUSATE SODIUM 8.6-50 MG PO TABS: 1.0000 | ORAL_TABLET | Freq: Every day | ORAL | Status: DC

## 2016-02-24 MED ORDER — VITAMIN B-12 1000 MCG PO TABS: 1000.0000 ug | ORAL_TABLET | ORAL | Status: DC

## 2016-02-24 MED ORDER — LORAZEPAM 0.5 MG PO TABS: 0.5000 mg | ORAL_TABLET | Freq: Three times a day (TID) | ORAL | Status: DC | PRN

## 2016-02-24 MED ORDER — CEFEPIME-DEXTROSE 2 GM/50ML IV SOLR
2.0000 g | Freq: Once | INTRAVENOUS | Status: AC
  Administered 2016-02-24: 2 g via INTRAVENOUS
  Filled 2016-02-24: qty 50

## 2016-02-24 MED ORDER — LORAZEPAM 2 MG/ML IJ SOLN
1.0000 mg | INTRAMUSCULAR | Status: DC | PRN
  Administered 2016-02-24 – 2016-02-25 (×3): 1 mg via INTRAVENOUS
  Filled 2016-02-24 (×3): qty 1

## 2016-02-24 MED ORDER — FLUCONAZOLE 100 MG PO TABS: 100.0000 mg | ORAL_TABLET | Freq: Every day | ORAL | Status: DC

## 2016-02-24 MED ORDER — VITAMIN B-1 100 MG PO TABS: 100.0000 mg | ORAL_TABLET | Freq: Every day | ORAL | Status: DC

## 2016-02-24 MED ORDER — MORPHINE SULFATE (CONCENTRATE) 10 MG/0.5ML PO SOLN
10.0000 mg | ORAL | Status: DC | PRN
  Administered 2016-02-24 (×2): 10 mg via ORAL
  Filled 2016-02-24 (×2): qty 1

## 2016-02-24 MED ORDER — DICYCLOMINE HCL 20 MG PO TABS
40.0000 mg | ORAL_TABLET | Freq: Four times a day (QID) | ORAL | Status: DC
  Filled 2016-02-24 (×3): qty 2

## 2016-02-24 MED ORDER — LEVETIRACETAM 100 MG/ML PO SOLN
1500.0000 mg | Freq: Two times a day (BID) | ORAL | Status: DC
  Administered 2016-02-24: 22:00:00 1500 mg via ORAL
  Filled 2016-02-24 (×8): qty 15

## 2016-02-24 MED ORDER — ONDANSETRON 8 MG PO TBDP: 8.0000 mg | ORAL_TABLET | Freq: Three times a day (TID) | ORAL | Status: DC | PRN

## 2016-02-24 MED ORDER — FAMOTIDINE 20 MG PO TABS: 20.0000 mg | ORAL_TABLET | Freq: Every day | ORAL | Status: DC

## 2016-02-24 MED ORDER — CEFEPIME-DEXTROSE 2 GM/50ML IV SOLR
2.0000 g | Freq: Two times a day (BID) | INTRAVENOUS | Status: DC
  Filled 2016-02-24: qty 50

## 2016-02-24 MED ORDER — SODIUM CHLORIDE 0.9% FLUSH
3.0000 mL | Freq: Two times a day (BID) | INTRAVENOUS | Status: DC
  Administered 2016-02-24 – 2016-02-25 (×3): 3 mL via INTRAVENOUS

## 2016-02-24 MED ORDER — MEGESTROL ACETATE 400 MG/10ML PO SUSP
400.0000 mg | Freq: Two times a day (BID) | ORAL | Status: DC
  Filled 2016-02-24 (×2): qty 10

## 2016-02-24 MED ORDER — ACETAMINOPHEN 650 MG RE SUPP: 650.0000 mg | Freq: Four times a day (QID) | RECTAL | Status: DC | PRN

## 2016-02-24 MED ORDER — OXYCODONE HCL 5 MG PO TABS: 5.0000 mg | ORAL_TABLET | ORAL | Status: DC | PRN

## 2016-02-24 MED ORDER — METOPROLOL SUCCINATE ER 50 MG PO TB24: 50.0000 mg | ORAL_TABLET | Freq: Every day | ORAL | Status: DC

## 2016-02-24 MED ORDER — MORPHINE SULFATE (CONCENTRATE) 10 MG/0.5ML PO SOLN
5.0000 mg | ORAL | Status: DC
  Administered 2016-02-24 – 2016-02-25 (×6): 5 mg via ORAL
  Filled 2016-02-24 (×5): qty 1

## 2016-02-24 MED ORDER — ENOXAPARIN SODIUM 60 MG/0.6ML ~~LOC~~ SOLN
1.0000 mg/kg | Freq: Two times a day (BID) | SUBCUTANEOUS | Status: DC
  Filled 2016-02-24: qty 0.6

## 2016-02-24 MED ORDER — SODIUM CHLORIDE 0.9 % IV BOLUS (SEPSIS)
250.0000 mL | Freq: Once | INTRAVENOUS | Status: AC
  Administered 2016-02-24: 250 mL via INTRAVENOUS

## 2016-02-24 MED ORDER — POLYETHYLENE GLYCOL 3350 17 G PO PACK: 17.0000 g | PACK | Freq: Every day | ORAL | Status: DC | PRN

## 2016-02-24 MED ORDER — ASPIRIN EC 81 MG PO TBEC: 81.0000 mg | DELAYED_RELEASE_TABLET | Freq: Every day | ORAL | Status: DC

## 2016-02-24 MED ORDER — ACETAMINOPHEN 325 MG PO TABS: 650.0000 mg | ORAL_TABLET | Freq: Four times a day (QID) | ORAL | Status: DC | PRN

## 2016-02-24 MED ORDER — BISACODYL 5 MG PO TBEC: 5.0000 mg | DELAYED_RELEASE_TABLET | Freq: Two times a day (BID) | ORAL | Status: DC

## 2016-02-24 MED ORDER — MORPHINE SULFATE (CONCENTRATE) 10 MG/0.5ML PO SOLN
5.0000 mg | ORAL | Status: DC | PRN
  Administered 2016-02-24: 5 mg via ORAL
  Filled 2016-02-24 (×2): qty 1

## 2016-02-24 MED ORDER — DEXTROSE 5 % IV SOLN: 1.0000 g | INTRAVENOUS | Status: DC

## 2016-02-24 MED ORDER — NYSTATIN 100000 UNIT/ML MT SUSP: 5.0000 mL | Freq: Four times a day (QID) | OROMUCOSAL | Status: DC

## 2016-02-24 MED ORDER — ASPIRIN EC 81 MG PO TBEC: 81.0000 mg | DELAYED_RELEASE_TABLET | ORAL | Status: DC

## 2016-02-24 NOTE — Progress Notes (Signed)
Location:      Place of Service:  SNF (31) Provider:  Toni Arthurs, NP-C  SPARKS,JEFFREY D, MD  Patient Care Team: Idelle Crouch, MD as PCP - General (Internal Medicine)  Extended Emergency Contact Information Primary Emergency Contact: Belinsky,Freddy W Address: [address]          [place], [place] [address] Johnnette Litter of [place] Phone: [phone] Mobile Phone: [phone] Relation: Spouse Secondary Emergency Contact: [place] of [place] Phone: [phone] Mobile Phone: [phone] Relation: Daughter  Code Status:  full Goals of care: Advanced Directive information Advanced Directives 01/29/2016  Does patient have an advance directive? Yes  Type of Advance Directive Living will;Healthcare Power of Attorney  Does patient want to make changes to advanced directive? No - Patient declined  Copy of advanced directive(s) in chart? No - copy requested  Would patient like information on creating an advanced directive? -     Chief Complaint  Patient presents with  . Acute Visit    HPI:  Pt is a 70 y.o. female seen today for an acute visit for c/o cough. Pt reports she is coughing at times, non-productive mostly. Pt denies n/v/d/f/c/cp/sob/ha/abd pain/dizziness. She does continue to c/o chronic back pain- related to osteoporosis and recent back surgery. Pt also reports having visual hallucinations that are not causing her any fear or anxiety. She reports seeing a man standing in the doorway and a woman with children in her room. She is aware these are hallucinations and not real. She says she has been having them for a while and are not worried about them. Pt's voice was a whisper- making it difficult to hear her at times. She appeared peaceful and not in any distress. Will obtain CXR and labs to assess for PNA. Palliative Care to meet with family tomorrow regarding code status and pt's overall decline. VSS. No other complaints.    Past Medical  History:  Diagnosis Date  . Arthritis    joints, shoulder and knees  . Diverticulosis   . GERD (gastroesophageal reflux disease)   . Heart murmur    no doctor follows her for this  . Hypertension    Past Surgical History:  Procedure Laterality Date  . BILATERAL CARPAL TUNNEL RELEASE Bilateral   . DILATION AND CURETTAGE OF UTERUS    . FRACTURE SURGERY Left 2007   fractured left  hip  . HARDWARE REMOVAL Left 06/24/2015   Procedure: HARDWARE REMOVAL/ [facility];  Surgeon: Dereck Leep, MD;  Location: [facility] ORS;  Service: Orthopedics;  Laterality: Left;  . IRRIGATION AND DEBRIDEMENT ABSCESS Right 09/24/2015   Procedure: IRRIGATION AND DEBRIDEMENT OF RIGHT HIP ABSCESS;  Surgeon: Florene Glen, MD;  Location: [facility] ORS;  Service: General;  Laterality: Right;  . JOINT REPLACEMENT Right 08/2014   total knee replacement  . KNEE ARTHROSCOPY Left   . ROTATOR CUFF REPAIR Right 2006   Dr. Mauri Pole   . TONSILLECTOMY    . TOTAL HIP ARTHROPLASTY Left 06/24/2015   Procedure: TOTAL HIP ARTHROPLASTY;  Surgeon: Dereck Leep, MD;  Location: [facility] ORS;  Service: Orthopedics;  Laterality: Left;  . TOTAL KNEE ARTHROPLASTY Right 08/25/2014   Dr. Margaretmary Eddy    Allergies  Allergen Reactions  . Statins Other (See Comments)    Reaction:  Leg cramps   . Sulfa Antibiotics Nausea Only  . Other Rash    "leg cramps"  . Penicillins Rash and Other (See Comments)    Has patient had a PCN  reaction causing immediate rash, facial/tongue/throat swelling, SOB or lightheadedness with hypotension: No Has patient had a PCN reaction causing severe rash involving mucus membranes or skin necrosis: No Has patient had a PCN reaction that required hospitalization No Has patient had a PCN reaction occurring within the last 10 years: Yes If all of the above answers are "NO", then may proceed with Cephalosporin use.  . Tape Rash      Medication List    Notice   This visit is during an admission. Changes to the med list made in  this visit will be reflected in the After Visit Summary of the admission.     Review of Systems  Constitutional: Positive for appetite change and fatigue. Negative for activity change, chills, diaphoresis and fever.  HENT: Negative for congestion, ear pain, postnasal drip, rhinorrhea, sinus pressure, sneezing, sore throat, trouble swallowing and voice change.   Eyes: Negative.   Respiratory: Positive for cough. Negative for choking, chest tightness, shortness of breath and wheezing.   Cardiovascular: Negative for chest pain, palpitations and leg swelling.  Gastrointestinal: Negative for abdominal distention, abdominal pain, constipation, diarrhea, nausea and vomiting.  Genitourinary: Negative for difficulty urinating (foley), dysuria, frequency and urgency.  Musculoskeletal: Positive for arthralgias (typical arthritis), back pain and gait problem. Negative for myalgias.  Skin: Negative for color change, pallor, rash and wound.  Neurological: Negative for dizziness, tremors, seizures, syncope, speech difficulty, weakness, light-headedness, numbness and headaches.  Psychiatric/Behavioral: Negative for agitation and behavioral problems.  All other systems reviewed and are negative.   Immunization History  Administered Date(s) Administered  . Influenza,inj,Quad PF,36+ Mos 01/30/2016   Pertinent  Health Maintenance Due  Topic Date Due  . COLONOSCOPY  07/24/1994  . DEXA SCAN  07/23/2009  . PNA vac Low Risk Adult (1 of 2 - PCV13) 07/23/2009  . MAMMOGRAM  03/25/2017  . INFLUENZA VACCINE  Completed   Fall Risk  10/07/2015 09/10/2015 09/09/2015  Falls in the past year? Yes - Yes  Number falls in past yr: 2 or more - 2 or more  Injury with Fall? Yes - Yes  Risk Factor Category  - - High Fall Risk  Risk for fall due to : Impaired balance/gait;Other (Comment) - History of fall(s);Impaired balance/gait;Impaired mobility  Risk for fall due to (comments): dizzy when stands  - -  Follow up - Falls  prevention discussed -   Functional Status Survey:    Vitals:   02/22/16 0600  BP: (!) 112/49  Pulse: 84  Resp: 20  Temp: 97.6 F (36.4 C)  SpO2: 93%  Weight: 109 lb (49.4 kg)   Body mass index is 19.94 kg/m. Physical Exam  Constitutional: She is oriented to person, place, and time. Vital signs are normal. She appears well-developed. She appears cachectic. She is active and cooperative. She appears ill. No distress. Nasal cannula in place.  HENT:  Head: Normocephalic and atraumatic.  Mouth/Throat: Uvula is midline, oropharynx is clear and moist and mucous membranes are normal. Mucous membranes are not pale, not dry and not cyanotic.  Eyes: Conjunctivae, EOM and lids are normal. Pupils are equal, round, and reactive to light.  Neck: Trachea normal, normal range of motion and full passive range of motion without pain. Neck supple. No JVD present. No tracheal deviation, no edema and no erythema present. No thyromegaly present.  Cardiovascular: Normal rate, regular rhythm, normal heart sounds, intact distal pulses and normal pulses.  Exam reveals no gallop, no distant heart sounds, no friction rub and no  decreased pulses.   No murmur heard. Pulmonary/Chest: Effort normal. No accessory muscle usage. No respiratory distress. She has decreased breath sounds in the right lower field and the left lower field. She has no wheezes. She has no rhonchi. She has no rales. She exhibits no tenderness.  Abdominal: Normal appearance and bowel sounds are normal. She exhibits no distension and no ascites. There is no tenderness.  Musculoskeletal: Normal range of motion. She exhibits no edema or tenderness.       Lumbar back: She exhibits deformity and pain.  Expected osteoarthritis, stiffness  Neurological: She is alert and oriented to person, place, and time. She has normal strength.  Skin: Skin is warm, dry and intact. She is not diaphoretic. No cyanosis. There is pallor. Nails show no clubbing.    Psychiatric: She has a normal mood and affect. Her speech is normal and behavior is normal. Judgment and thought content normal. Cognition and memory are normal.  Nursing note and vitals reviewed.   Labs reviewed:  Recent Labs  07/18/15 2038  08/31/15 1057  11/02/15 1601  02/01/16 0430 02/22/16 1330 02/24/16 0415  NA 121*  < > 134*  < > 135  < > 137 134* 140  K 3.6  < > 2.7*  < > 3.8  < > 4.1 4.0 4.6  CL 90*  < > 92*  < > 97*  < > 104 96* 100*  CO2 22  < > 29  < > 30  < > _0 GLUCOSE 113*  < > 125*  < > 117*  < > 97 74 106*  BUN 19  < > 7  < > 11  < > 7 15 27*  CREATININE 0.76  < > 0.73  < > 0.61  < > 0.43* 0.68 0.74  CALCIUM 8.3*  < > 9.6  < > 10.9*  < > 9.4 10.5* 11.6*  MG 2.0  --  1.9  --  2.1  --   --   --  1.7  PHOS 3.7  --   --   --   --   --   --   --   --   < > = values in this interval not displayed.  Recent Labs  01/29/16 0838 02/22/16 1330 02/24/16 0415  AST _1 ALT 19 10* 12*  ALKPHOS 108 142* 134*  BILITOT 0.4 0.4 0.9  PROT 7.2 6.5 7.1  ALBUMIN 3.3* 2.5* 2.8*    Recent Labs  01/29/16 0838  02/01/16 0430 02/22/16 1330 02/24/16 0415  WBC 10.3  < > 9.8 8.2 18.9*  NEUTROABS 5.5  --   --  4.8 16.2*  HGB 13.0  < > 11.6* 10.4* 11.3*  HCT 37.7  < > 34.1* 32.1* 36.1  MCV 86.2  < > 86.9 84.9 85.9  PLT 490*  < > 312 660* 643*  < > = values in this interval not displayed. Lab Results  Component Value Date   TSH 1.234 01/29/2016   Lab Results  Component Value Date   HGBA1C 4.4 (L) 01/29/2016   No results found for: CHOL, HDL, LDLCALC, LDLDIRECT, TRIG, CHOLHDL  Significant Diagnostic Results in last 30 days:  Dg Lumbar Spine 2-3 Views  Result Date: 01/30/2016 CLINICAL DATA:  Initial evaluation for acute lower back pain. EXAM: LUMBAR SPINE - 2-3 VIEW COMPARISON:  Comparison made with prior CT from 07/16/2015. FINDINGS: Vertebral bodies normally aligned with preservation of the normal lumbar lordosis. Compression  deformities involving the  T12, L2, and L3 vertebral bodies are stable. No acute fracture or subluxation. Multilevel degenerative spondylolysis grossly stable, most prevalent at L4-5. Visualized sacrum intact. Left total hip arthroplasty partially visualized. SI joints approximated. Bladder is distended with secrete IV contrast material within the bladder lumen. Soft tissues otherwise unremarkable. Aortic atherosclerosis noted. Osteopenia noted. IMPRESSION: 1. No radiographic evidence for acute abnormality within the lumbar spine. 2. Stable compression deformities involving the T12, L2, and L3 vertebral bodies. 3. Osteopenia. 4. Aortic atherosclerosis. Electronically Signed   By: Jeannine Boga M.D.   On: 01/30/2016 18:25   Ct Angio Chest Pe W Or Wo Contrast  Result Date: 01/29/2016 CLINICAL DATA:  Numbness in the legs off and on but worse today. Recent hospitalization for pneumonia. Back pain. Acute respiratory failure with hypoxia. EXAM: CT ANGIOGRAPHY CHEST WITH CONTRAST TECHNIQUE: Multidetector CT imaging of the chest was performed using the standard protocol during bolus administration of intravenous contrast. Multiplanar CT image reconstructions and MIPs were obtained to evaluate the vascular anatomy. CONTRAST:  75 mL Isovue 370 COMPARISON:  12/26/2015 FINDINGS: Cardiovascular: Technically adequate study with good opacification of the central and segmental pulmonary arteries. No focal filling defects demonstrated. No evidence of significant pulmonary embolus. Normal heart size. Coronary artery and aortic calcifications. Normal caliber thoracic aorta. No aortic dissection. Great vessel origins are patent. Mediastinum/Nodes: Scattered lymph nodes in the mediastinum are not pathologically enlarged. Esophagus is decompressed. Lungs/Pleura: Lungs are clear. No pleural effusion or pneumothorax. Upper Abdomen: No acute abnormality. Musculoskeletal: Multiple vertebral compression fractures, including T5, T9, and L1. The T5 and T9  fractures are new since the previous study, suggesting acute compression. This likely indicates osteoporotic fractures. No retropulsion of fracture fragments. T5 demonstrates about 50% compression and T9 demonstrates about 75% compression. Review of the MIP images confirms the above findings. IMPRESSION: No evidence of significant pulmonary embolus. No evidence of active pulmonary disease. Fractures of the T9 and T5 vertebral bodies, new since previous study of 12/26/2015 suggesting acute fractures, likely osteoporotic. Electronically Signed   By: Lucienne Capers M.D.   On: 01/29/2016 18:59   Mr Thoracic Spine Wo Contrast  Result Date: 02/02/2016 CLINICAL DATA:  Initial evaluation for inability to ambulate. Shown to have fractures on prior CT from 01/29/2016. EXAM: MRI THORACIC SPINE WITHOUT CONTRAST TECHNIQUE: Multiplanar, multisequence MR imaging of the thoracic spine was performed. No intravenous contrast was administered. COMPARISON:  Prior CT from 01/29/2016. FINDINGS: Alignment: Vertebral bodies are normally aligned with preservation of the normal thoracic kyphosis. No listhesis. Vertebrae: There is an acute appearing fracture of the T5 vertebral body. Associated approximately 50% height loss with trace 3 mm bony retropulsion. No significant canal stenosis. This fracture is benign/osteoporotic in appearance. Additional acute compression fracture involving the T9 vertebral body. Associated approximately 70% height loss. Approximately 4 mm bony retropulsion at the posterior aspect of the T9 inferior endplate. Superimposed heterogeneous T1/T2 signal intensity within the epidural space extending from T8 through T9-10 favored to reflect associated hemorrhage/hematoma (series 5, image 6). Superimposed facet disease present at this level, particularly on the left. There is resultant severe canal stenosis, with the thecal sac measuring approximately 7 x 6 mm at its most narrow point (AP by transverse). This is  at the inferior aspect of T9 (series 7, image 28, 29). Associated edema within the paraspinous soft tissues noted adjacent to the T8 through T10 vertebral bodies. No underlying abnormal STIR signal intensity within the T8-9 or T9-10 disc to suggest discitis/osteomyelitis. This  fracture is also favored to be benign/osteoporotic in nature. Compression deformity extending through the inferior endplate of W23 with up to 70% height loss is chronic in nature. 3 mm bony retropulsion without stenosis. Degenerative Schmorl's node through the superior endplate of L2 with associated height loss also chronic in nature. Vertebral body heights otherwise maintained. Other than the fractures, the signal intensity within the vertebral body bone marrow is within normal limits. Probable benign hemangiomas noted within the T3 and T10 vertebral bodies. Cord: Signal intensity within the thoracic spinal cord is within normal limits. No definite cord signal changes seen at the level of the T9 fracture, although evaluation somewhat in all due to adjacent epidural hematoma and motion artifact. Paraspinal and other soft tissues: Paraspinous edema seen adjacent to the T9 fracture, extending from T7-8 through T10-11. Paraspinous soft tissues otherwise within normal limits. Small layering bilateral pleural effusions noted. Visualized visceral structures are unremarkable. Disc levels: T4-5:  Mild degenerative disc bulge without significant stenosis. T5-6: Minimal 3 mm bony retropulsion at the inferior aspect of the T5 inferior endplate related to the compression fracture. No significant stenosis. Mild posterior element hypertrophy. T8-9: No significant disc bulge. Posterior element hypertrophy. Small amount of epidural hematoma related to the T9 fracture with resultant mild canal stenosis. T9-10: T9 compression fracture with 4 mm bony retropulsion, epidural hematoma, and facet hypertrophy. Resultant severe canal stenosis. T11-12: Mild diffuse  disc bulge with disc desiccation. No significant stenosis. T12-L1: Mild diffuse degenerative disc bulge with disc desiccation. Mild facet hypertrophy. No significant stenosis. IMPRESSION: 1. Acute compression fracture involving the T9 vertebral body with up to 70% height loss and 4 mm bony retropulsion. Associated epidural hematoma extending from T8 through T9-10 with resultant severe canal stenosis. No definite cord signal changes. This fracture is favored to be posttraumatic and/or osteoporotic in nature. Note is made of a fair amount of paraspinous soft tissue edema about the fracture, somewhat unusual, but still favored to be posttraumatic in nature. No definite evidence for infection, although if the patient has elevated white count with fever, etc, this could conceivably be considered. 2. Acute compression fracture involving the T5 vertebral body with approximately 50% height loss and 3 mm bony retropulsion. No significant stenosis. 3. Stable chronic compression deformity of T12 without stenosis. 4. Mild multilevel degenerative spondylolysis as detailed above. No other significant stenosis. 5. Small layering bilateral pleural effusions. Critical Value/emergent results were called by telephone at the time of interpretation on 02/02/2016 at 3:05 pm to Dr. Alexis Goodell , who verbally acknowledged these results. Electronically Signed   By: Jeannine Boga M.D.   On: 02/02/2016 15:58   Mr Lumbar Spine Wo Contrast  Result Date: 02/02/2016 CLINICAL DATA:  Lower extremity weakness. EXAM: MRI LUMBAR SPINE WITHOUT CONTRAST TECHNIQUE: Multiplanar, multisequence MR imaging of the lumbar spine was performed. No intravenous contrast was administered. COMPARISON:  None. FINDINGS: Segmentation:  Standard. Alignment:  Physiologic. Vertebrae: No acute fracture, evidence of discitis, or bone lesion. Chronic T12, L2 and L3 vertebral body compression fractures. Mild degenerative changes of bilateral sacroiliac joints.  Conus medullaris: Extends to the L1 level and appears normal. Paraspinal and other soft tissues: No focal paraspinal abnormality. Disc levels: Disc spaces: Degenerative disc disease with disc height loss at T12-L1 and L4-5. Disc desiccation throughout the remainder of the lumbar spine. T12-L1: Mild broad-based disc bulge flattening the ventral thecal sac with bilateral lateral recess narrowing. Mild bilateral facet arthropathy. Mild bilateral foraminal narrowing. No central canal stenosis. L1-L2: Mild broad-based disc  bulge. Mild bilateral facet arthropathy. Bilateral lateral recess narrowing and mild spinal stenosis. No evidence of neural foraminal stenosis. L2-L3: Broad-based disc bulge eccentric towards the left. Moderate bilateral facet arthropathy. Severe spinal stenosis and bilateral lateral recess stenosis. Mild bilateral foraminal stenosis. L3-L4: Mild broad-based disc bulge flattening the ventral thecal sac. Moderate bilateral facet arthropathy. Bilateral lateral recess stenosis and moderate spinal stenosis. No evidence of neural foraminal stenosis. L4-L5: Broad-based disc bulge with a central disc protrusion. Moderate bilateral facet arthropathy. Moderate spinal stenosis and bilateral lateral recess stenosis. Mild left foraminal stenosis. L5-S1: Broad-based disc bulge. Severe bilateral facet arthropathy. No evidence of neural foraminal stenosis. No central canal stenosis. IMPRESSION: 1. At L2-3 there is a broad-based disc bulge eccentric towards the left. Moderate bilateral facet arthropathy. Severe spinal stenosis and bilateral lateral recess stenosis. Mild bilateral foraminal stenosis. 2. At L3-4 there is a mild broad-based disc bulge flattening the ventral thecal sac. Moderate bilateral facet arthropathy. Bilateral lateral recess stenosis and moderate spinal stenosis. 3. At L4-5 there is a broad-based disc bulge with a central disc protrusion. Moderate bilateral facet arthropathy. Moderate spinal  stenosis and bilateral lateral recess stenosis. Mild left foraminal stenosis. 4. No evidence of osseous metastatic disease of the lumbar spine. Electronically Signed   By: Kathreen Devoid   On: 02/02/2016 14:18   Dg Chest Port 1 View  Result Date: 02/24/2016 CLINICAL DATA:  70 y/o F; sepsis and concern for pneumonia. History of congestive heart failure appear EXAM: PORTABLE CHEST 1 VIEW COMPARISON:  01/29/2016 chest radiograph. FINDINGS: Stable cardiac silhouette given patient rotation and technique. Interval mid thoracic posterior surgical hardware noted. Multilevel degenerative changes of the thoracic spine. Right hemidiaphragm elevation and prominence of right hilar markings, probably accentuated by patient's rotation. Right lung base poorly visualized. No definite focal consolidation, effusion, or pneumothorax. IMPRESSION: Right hemidiaphragm elevation and prominence of right hilar markings, probably accentuated by patient's rotation. Right lung base is poorly visualized. No definite focal consolidation, effusion, or pneumothorax. If clinically indicated consider PA and lateral chest radiographs if patient is able or chest CT for better visualization of the right lung base. Electronically Signed   By: Kristine Garbe M.D.   On: 02/24/2016 04:16   Dg Chest Port 1 View  Result Date: 01/29/2016 CLINICAL DATA:  Weakness and numbness in legs. EXAM: PORTABLE CHEST 1 VIEW COMPARISON:  12/26/2015 CT chest FINDINGS: The heart size and mediastinal contours are within normal limits. Atherosclerosis of the aorta. Both lungs are clear. No pleural effusion or pneumothorax. 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 IMPRESSION: No acute pulmonary disease.  Aortic atherosclerosis. Electronically Signed   By: Ashley Royalty M.D.   On: 01/29/2016 15:32    Assessment/Plan 1. Cough  Xray chest  Labs  Continue prn guaifenesin  Palliative care to meet with family tomorrow  Family/ staff  Communication:   Total Time:  Documentation:  Face to Face:  Family/Phone:   Labs/tests ordered: cbc, met c, ua, c&s, CXR  Medication list reviewed and assessed for continued appropriateness.  Vikki Ports, NP-C Geriatrics [facility] [phone], [place] [address] Cell Phone (Mon-Fri 8am-5pm):  [phone] On Call:  [phone] & follow prompts after 5pm & weekends Office Phone:  [phone] Office Fax:  [phone]

## 2016-02-24 NOTE — ED Notes (Signed)
Pt turned and reposition to side. Small smear of BM in brief. Temp coming down. Pt is alert and orient. Prevalon boots in place. Incision to mid back healing well.

## 2016-02-24 NOTE — ED Triage Notes (Signed)
Pt arrived via EMS from [facility]. EMS got called out for difficulty breathing. When EMS arrived pt was in the 80s on 4L. Pt is reportedly on 4L all the time. Pt has congested sounding cough.

## 2016-02-24 NOTE — Clinical Social Work Note (Signed)
Clinical Social Work Assessment  Patient Details  Name: Nancy BlakeSandra S Davidoff MRN: [id number] Date of Birth: 1946/04/13  Date of referral:  02/24/16               Reason for consult:  Discharge Planning (admitted [facility])                Permission sought to share information with:  Case Production designer, theatre/television/filmManager, Magazine features editoracility Contact Representative, Family Supports Permission granted to share information::  Yes, Scientist, product/process developmentVerbal Permission Granted  Name::        Agency::  [facility]  Relationship::  family (daughter at bedside)  Contact Information:     Housing/Transportation Living arrangements for the past 2 months:  Skilled Building surveyorursing Facility Source of Information:  Medical Team, Adult Children, Facility, Palliative Care Team, Case Manager Patient Interpreter Needed:  None Criminal Activity/Legal Involvement Pertinent to Current Situation/Hospitalization:  No - Comment as needed Significant Relationships:  Adult Children, Other Family Members, Community Support Lives with:  Facility Resident Do you feel safe going back to the place where you live?  No Need for family participation in patient care:  Yes (Comment)  Care giving concerns:  Patient is a current resident at SNF[facility]. Appears there was confusion with plan of care as facility sent patient to hospital for treatment, however patient was a DNR with no admission to hospital.  Hospice referral was made in community and patient's family was going to sign her into facility, however she was transferred here.  Bed was given up, but plan remains for patient to go to hospice home when bed available.   Social Worker assessment / plan:  LCSW received consult and call from pallative care team and hospice home.  Patient is actively working with hospice in the community and was followed by hospice at facility. Plan will be hospice home when bed available.  LCSW will follow for needs and emotional support. Will assist  with discharge planning.  Employment status:  Retired Database administratornsurance information:  Managed Medicare PT Recommendations:  Not assessed at this time Information / Referral to community resources:     Patient/Family's Response to care:  Agreeable to plan  Patient/Family's Understanding of and Emotional Response to Diagnosis, Current Treatment, and Prognosis:  Family is frustrated with events leading to admission from SNF.  Family appreciative of work with hospice and working to meet patient needs for comfort care.  Emotional Assessment Appearance:  Appears stated age Attitude/Demeanor/Rapport:  Other (resting comfortabily) Affect (typically observed):  Accepting, Adaptable Orientation:  Oriented to Self Alcohol / Substance use:  Not Applicable Psych involvement (Current and /or in the community):  No (Comment)  Discharge Needs  Concerns to be addressed:  No discharge needs identified Readmission within the last 30 days:  No Current discharge risk:  None Barriers to Discharge:  Hospice Bed not available   Raye SorrowCoble, Krista Godsil N, LCSW 02/24/2016, 12:22 PM

## 2016-02-24 NOTE — Progress Notes (Signed)
Updated weight therefore will change Enoxaparin to 50 mg SQ q12 hours.  Demetrius Charityeldrin D. Tarynn Garling, PharmD

## 2016-02-24 NOTE — Progress Notes (Signed)
I was notified by Nursing Home night shift nursing staff of the events that transpired overnight with pt's breathing difficulty, etc. Pt was sent to the ED despite orders to Do Not Hospitalize, per family discussion with Palliative Care NP in the facility yesterday, due to miscommunication/ misunderstanding. I called the ED MD to discuss the pt's situation with the MD. I re-iterated that the pt was supposed to be transferred to the Hospice Home today for EOL Care and sx management, a plan resulting from a lengthy conversation yesterday between the family and the Palliative Care NP. I updated the MD on the Hospice diagnoses that enabled her to meet admitting criteria. Pt has had a slow decline over the past year with an unidentifiable neurological condition. She has progressed to the point of Protein-calorie malnutrition, weight loss, dysphagia. She has severe degenerative spinal stenosis causing deformity and chronic severe pain.  MD reported that the pt had already been admitted and there was nothing else that could be done about it at that point. I requested the MD order a Palliative Care Consult while pt is in the hospital. This would enable the Palliative Medicine Team and/or Hospice to follow up with the family for further discussions of goals of medical therapies, code status, etc. It was reported the family is now very confused about what to do and pt is now a Full Code again. MD was agreeable to [facility] Consult in hospital. I appreciate the ED MD allowing me to discuss my concerns with him regarding the patient and the patient disposition.   Brynda RimShannon H. Liborio Saccente, NP-C Geriatrics [facility] [phone] [address] [place], [place] [address] Cell Phone (Mon-Fri 8am-5pm):  [phone] On Call:  [phone] & follow prompts after 5pm & weekends Office Phone:  [phone] Office Fax:  [phone]

## 2016-02-24 NOTE — Progress Notes (Signed)
Family Meeting Note  Advance Directive:DNR  Today a meeting took place with the dter who si pt's HCPOA.  Patient is unable to participate due ZO:XWRUEAto:Lacked capacity and severe pain  The following clinical team members were present during this meeting: pt's daughter  /w dter pt is DNR and under Palliative care and plan was to transfer to Hospice facility when bed opens up however with some miscommunication with the staff got transferred to ER -pt is progressively declining and severe back pain due to spinal stenosis -I had a long onverstiaon with dter and she understand pt's poor prognosis. Will keep her comfortable with m,orphine and ativan and d/c iv abxs in am. dter is ok to stop po meds also. Pt will go to hospice home once bed opens up. -seen by Hospice liason Clydie BraunKaren in the hospital  The following were discussed:Patient's diagnosis: , Patient's progosis:  Poor     Time spent during discussion: 25 mins  Jomari Bartnik, MD

## 2016-02-24 NOTE — H&P (Signed)
[facility] [place] [facility]   PATIENT NAME: Nancy SchaumannSandra York    MR#:  [id number]  DATE OF BIRTH:  Nov 09, 1945  DATE OF ADMISSION:  02/24/2016  PRIMARY CARE PHYSICIAN: SPARKS,JEFFREY D, MD   REQUESTING/REFERRING PHYSICIAN:   CHIEF COMPLAINT:   Chief Complaint  Patient presents with  . Shortness of Breath    HISTORY OF PRESENT ILLNESS: Nancy SchaumannSandra York  is a 70 y.o. female with a known history of Diverticulosis, GERD, hypertension, arthritis presented to the emergency room with difficulty breathing. Patient also had some cough which is dry in nature. Patient has the indwelling Foley catheter which is chronic and she was supposed to go to hospice today. EMS brought her to the emergency room and patient conveyed to the emergency room physician that she wanted everything to be done. Patient currently is full code by CODE STATUS. Patient was worked up chest x-ray did not reveal any pneumonia but her WBC count was elevated in the blood. She was started on antibiotics and the fluids were given. No complaints of any abdominal pain, nausea vomiting and diarrhea. Has some suprapubic discomfort. No history of any orthopnea or proximal nocturnal dyspnea. During the workup in the emergency room her troponin was elevated, but there was no chest pain and EKG did not reveal any ST segment changes.  PAST MEDICAL HISTORY:   Past Medical History:  Diagnosis Date  . Arthritis    joints, shoulder and knees  . Diverticulosis   . GERD (gastroesophageal reflux disease)   . Heart murmur    no doctor follows her for this  . Hypertension     PAST SURGICAL HISTORY: Past Surgical History:  Procedure Laterality Date  . BILATERAL CARPAL TUNNEL RELEASE Bilateral   . DILATION AND CURETTAGE OF UTERUS    . FRACTURE SURGERY Left 2007   fractured left  hip  . HARDWARE REMOVAL Left 06/24/2015   Procedure: HARDWARE REMOVAL/ DHS;  Surgeon: Donato HeinzJames P Hooten, MD;  Location: [facility] ORS;  Service:  Orthopedics;  Laterality: Left;  . IRRIGATION AND DEBRIDEMENT ABSCESS Right 09/24/2015   Procedure: IRRIGATION AND DEBRIDEMENT OF RIGHT HIP ABSCESS;  Surgeon: Lattie Hawichard E Cooper, MD;  Location: [facility] ORS;  Service: General;  Laterality: Right;  . JOINT REPLACEMENT Right 08/2014   total knee replacement  . KNEE ARTHROSCOPY Left   . ROTATOR CUFF REPAIR Right 2006   Dr. Gerrit Heckaliff   . TONSILLECTOMY    . TOTAL HIP ARTHROPLASTY Left 06/24/2015   Procedure: TOTAL HIP ARTHROPLASTY;  Surgeon: Donato HeinzJames P Hooten, MD;  Location: [facility] ORS;  Service: Orthopedics;  Laterality: Left;  . TOTAL KNEE ARTHROPLASTY Right 08/25/2014   Dr. Reita Chardhris Smith    SOCIAL HISTORY:  Social History  Substance Use Topics  . Smoking status: Never Smoker  . Smokeless tobacco: Never Used     Comment: no passive smoke in household  . Alcohol use No    FAMILY HISTORY:  Family History  Problem Relation Age of Onset  . Diabetes Father   . Breast cancer Neg Hx     DRUG ALLERGIES:  Allergies  Allergen Reactions  . Statins Other (See Comments)    Reaction:  Leg cramps   . Sulfa Antibiotics Nausea Only  . Other Rash    "leg cramps"  . Penicillins Rash and Other (See Comments)    Has patient had a PCN reaction causing immediate rash, facial/tongue/throat swelling, SOB or lightheadedness with hypotension: No Has patient had a PCN reaction causing severe  rash involving mucus membranes or skin necrosis: No Has patient had a PCN reaction that required hospitalization No Has patient had a PCN reaction occurring within the last 10 years: Yes If all of the above answers are "NO", then may proceed with Cephalosporin use.  . Tape Rash    REVIEW OF SYSTEMS:   CONSTITUTIONAL: No fever, has weakness.  EYES: No blurred or double vision.  EARS, NOSE, AND THROAT: No tinnitus or ear pain.  RESPIRATORY: No cough, shortness of breath, wheezing or hemoptysis.  CARDIOVASCULAR: No chest pain, orthopnea, edema.  GASTROINTESTINAL: No nausea,  vomiting, diarrhea or abdominal pain.  GENITOURINARY: Has dysuria, no hematuria.  ENDOCRINE: No polyuria, nocturia,  HEMATOLOGY: No anemia, easy bruising or bleeding SKIN: No rash or lesion. MUSCULOSKELETAL: No joint pain or arthritis.   NEUROLOGIC: No tingling, numbness, weakness.  PSYCHIATRY: No anxiety or depression.   MEDICATIONS AT HOME:  Prior to Admission medications   Medication Sig Start Date End Date Taking? Authorizing Provider  acetaminophen (TYLENOL) 500 MG tablet Take 1,000 mg by mouth every 6 (six) hours as needed.   Yes Historical Provider, MD  aspirin EC 81 MG tablet Take 81 mg by mouth every morning.    Yes Historical Provider, MD  bisacodyl (DULCOLAX) 5 MG EC tablet Take 5 mg by mouth 2 (two) times daily.   Yes Historical Provider, MD  calcium-vitamin D (OSCAL WITH D) 250-125 MG-UNIT tablet Take 2 tablets by mouth 2 (two) times daily.   Yes Historical Provider, MD  cetirizine (ZYRTEC) 10 MG tablet Take 10 mg by mouth daily as needed for allergies.   Yes Historical Provider, MD  dicyclomine (BENTYL) 20 MG tablet Take 40 mg by mouth every 6 (six) hours.   Yes Historical Provider, MD  fluconazole (DIFLUCAN) 100 MG tablet Take 100 mg by mouth daily.   Yes Historical Provider, MD  Glucosamine-Chondroitin (OSTEO BI-FLEX REGULAR STRENGTH) 250-200 MG TABS Take 2 tablets by mouth daily.   Yes Historical Provider, MD  levETIRAcetam (KEPPRA) 750 MG tablet Take 1,500 mg by mouth 2 (two) times daily.  12/01/15  Yes Historical Provider, MD  LORazepam (ATIVAN) 0.5 MG tablet Take 0.5 mg by mouth every 8 (eight) hours as needed for anxiety.   Yes Historical Provider, MD  megestrol (MEGACE) 400 MG/10ML suspension Take 400 mg by mouth 2 (two) times daily.   Yes Historical Provider, MD  metoprolol succinate (TOPROL-XL) 50 MG 24 hr tablet Take 50 mg by mouth daily. Take with or immediately following a meal.   Yes Historical Provider, MD  Morphine Sulfate (MORPHINE CONCENTRATE) 10 mg / 0.5 ml  concentrated solution Take 10 mg by mouth every 4 (four) hours as needed for severe pain.   Yes Historical Provider, MD  nystatin (MYCOSTATIN) 100000 UNIT/ML suspension Take 5 mLs by mouth 4 (four) times daily. 12/22/15  Yes Historical Provider, MD  ondansetron (ZOFRAN-ODT) 8 MG disintegrating tablet Take 8 mg by mouth every 8 (eight) hours as needed for nausea or vomiting. Reported on 09/29/2015   Yes Historical Provider, MD  oxyCODONE (OXY IR/ROXICODONE) 5 MG immediate release tablet Take 5-10 mg by mouth every 4 (four) hours as needed for severe pain.   Yes Historical Provider, MD  polyethylene glycol (MIRALAX / GLYCOLAX) packet Take 17 g by mouth daily as needed for mild constipation. 12/28/15  Yes Srikar Sudini, MD  ranitidine (ZANTAC) 150 MG tablet Take 150 mg by mouth 2 (two) times daily.    Yes Historical Provider, MD  sennosides-docusate sodium (  SENOKOT-S) 8.6-50 MG tablet Take 1 tablet by mouth at bedtime.   Yes Historical Provider, MD  thiamine 100 MG tablet Take 100 mg by mouth daily.   Yes Historical Provider, MD  vitamin B-12 (CYANOCOBALAMIN) 1000 MCG tablet Take 1,000 mcg by mouth every morning.   Yes Historical Provider, MD      PHYSICAL EXAMINATION:   VITAL SIGNS: Blood pressure (!) 127/52, pulse 97, temperature (!) 100.6 F (38.1 C), temperature source Rectal, resp. rate 15, height 5\' 5"  (1.651 m), weight 54.2 kg (119 lb 6.4 oz), SpO2 97 %.  GENERAL:  70 y.o.-year-old patient lying in the bed with no acute distress.  EYES: Pupils equal, round, reactive to light and accommodation. No scleral icterus. Extraocular muscles intact.  HEENT: Head atraumatic, normocephalic. Oropharynx dry and nasopharynx clear.  NECK:  Supple, no jugular venous distention. No thyroid enlargement, no tenderness.  LUNGS: Normal breath sounds bilaterally, no wheezing, rales,rhonchi or crepitation. No use of accessory muscles of respiration.  CARDIOVASCULAR: S1, S2 normal. No murmurs, rubs, or gallops.   ABDOMEN: Soft, nontender, nondistended. Bowel sounds present. No organomegaly or mass.  Has indwelling foley catheter. EXTREMITIES: No pedal edema, cyanosis, or clubbing.  NEUROLOGIC: Cranial nerves II through XII are intact. Muscle strength 5/5 in all extremities. Sensation intact. Gait not checked.  PSYCHIATRIC: The patient is alert and oriented x 3.  SKIN: No obvious rash, lesion, or ulcer.   LABORATORY PANEL:   CBC  Recent Labs Lab 02/22/16 1330 02/24/16 0415  WBC 8.2 18.9*  HGB 10.4* 11.3*  HCT 32.1* 36.1  PLT 660* 643*  MCV 84.9 85.9  MCH 27.5 26.7  MCHC 32.4 31.1*  RDW 16.7* 16.8*  LYMPHSABS 2.7 1.7  MONOABS 0.5 0.8  EOSABS 0.1 0.0  BASOSABS 0.0 0.1   ------------------------------------------------------------------------------------------------------------------  Chemistries   Recent Labs Lab 02/22/16 1330 02/24/16 0415  NA 134* 140  K 4.0 4.6  CL 96* 100*  CO2 29 28  GLUCOSE 74 106*  BUN 15 27*  CREATININE 0.68 0.74  CALCIUM 10.5* 11.6*  MG  --  1.7  AST 23 26  ALT 10* 12*  ALKPHOS 142* 134*  BILITOT 0.4 0.9   ------------------------------------------------------------------------------------------------------------------ estimated creatinine clearance is 55.2 mL/min (by C-G formula based on SCr of 0.74 mg/dL). ------------------------------------------------------------------------------------------------------------------ No results for input(s): TSH, T4TOTAL, T3FREE, THYROIDAB in the last 72 hours.  Invalid input(s): FREET3   Coagulation profile No results for input(s): INR, PROTIME in the last 168 hours. ------------------------------------------------------------------------------------------------------------------- No results for input(s): DDIMER in the last 72 hours. -------------------------------------------------------------------------------------------------------------------  Cardiac Enzymes  Recent Labs Lab 02/24/16 0415   TROPONINI 0.39*   ------------------------------------------------------------------------------------------------------------------ Invalid input(s): POCBNP  ---------------------------------------------------------------------------------------------------------------  Urinalysis    Component Value Date/Time   COLORURINE AMBER (A) 02/24/2016 0438   APPEARANCEUR CLOUDY (A) 02/24/2016 0438   APPEARANCEUR Hazy 08/13/2014 1258   LABSPEC 1.029 02/24/2016 0438   LABSPEC 1.017 08/13/2014 1258   PHURINE 5.0 02/24/2016 0438   GLUCOSEU NEGATIVE 02/24/2016 0438   GLUCOSEU Negative 08/13/2014 1258   HGBUR 1+ (A) 02/24/2016 0438   BILIRUBINUR NEGATIVE 02/24/2016 0438   BILIRUBINUR Negative 08/13/2014 1258   KETONESUR 1+ (A) 02/24/2016 0438   PROTEINUR 100 (A) 02/24/2016 0438   NITRITE NEGATIVE 02/24/2016 0438   LEUKOCYTESUR 3+ (A) 02/24/2016 0438   LEUKOCYTESUR Negative 08/13/2014 1258     RADIOLOGY: Dg Chest Port 1 View  Result Date: 02/24/2016 CLINICAL DATA:  70 y/o F; sepsis and concern for pneumonia. History of congestive heart failure appear EXAM: PORTABLE  CHEST 1 VIEW COMPARISON:  01/29/2016 chest radiograph. FINDINGS: Stable cardiac silhouette given patient rotation and technique. Interval mid thoracic posterior surgical hardware noted. Multilevel degenerative changes of the thoracic spine. Right hemidiaphragm elevation and prominence of right hilar markings, probably accentuated by patient's rotation. Right lung base poorly visualized. No definite focal consolidation, effusion, or pneumothorax. IMPRESSION: Right hemidiaphragm elevation and prominence of right hilar markings, probably accentuated by patient's rotation. Right lung base is poorly visualized. No definite focal consolidation, effusion, or pneumothorax. If clinically indicated consider PA and lateral chest radiographs if patient is able or chest CT for better visualization of the right lung base. Electronically Signed   By:  Mitzi Hansen M.D.   On: 02/24/2016 04:16    EKG: Orders placed or performed during the hospital encounter of 02/24/16  . ED EKG  . ED EKG    IMPRESSION AND PLAN: 71 year old female patient with history of arthritis, hypertension, GERD presented to the emergency room with the difficulty breathing and dysuria. Patient has chronic indwelling Foley catheter. Admitting diagnosis 1. Sepsis 2. Urinary tract infection 3. Abnormal troponin which could be secondary to demand ischemia 4. Hypertension 5. Hyponatremia Treatment plan Admit patient to medical floor Cycle troponin to rule out ischemia Start patient on IV cefepime antibiotic IV fluids Follow-up electrolytes and cultures Supportive care.  All the records are reviewed and case discussed with ED provider. Management plans discussed with the patient, family and they are in agreement.  CODE STATUS:FULL CODE Code Status History    Date Active Date Inactive Code Status Order ID Comments User Context   01/29/2016  7:46 PM 02/02/2016  9:44 PM Full Code [id number]  Katharina Caper, MD Inpatient   12/26/2015  2:21 PM 12/28/2015  5:30 PM Full Code [id number]  Gracelyn Nurse, MD Inpatient   09/22/2015  8:13 PM 09/26/2015  7:45 PM Full Code [id number]  Adrian Saran, MD Inpatient   07/18/2015 10:41 PM 07/21/2015  7:44 PM Full Code [id number]  Oralia Manis, MD Inpatient   08/25/2014  7:10 PM 08/28/2014  3:08 PM Full Code [id number]  Donato Heinz, MD Inpatient   08/25/2014  7:10 PM 08/25/2014  7:10 PM Full Code [id number]  Donato Heinz, MD Inpatient       TOTAL TIME TAKING CARE OF THIS PATIENT: 50 minutes.    Ihor Austin M.D on 02/24/2016 at 6:28 AM  Between 7am to 6pm - Pager - [phone]  After 6pm go to [email or web address] - password [email or web address] [facility]  [facility]  Office  [phone]  CC: Primary care physician; Marguarite Arbour, MD

## 2016-02-24 NOTE — Progress Notes (Signed)
CSW [name] and attending physician Dr. Allena KatzPatel made aware that yesterday family wanted Mrs. Nancy York to go the hospice home from [facility]. She was sent to the ED overnight due to a miscommunication at the facility. Writer spoke this morning with patient's daughter Nancy York [facility](HCPOA) and patient's husband Nancy York, both expressed their desire for the patient to be transferred to the Hospice home WHEN A BED is AVAILABLE and that the patient is a DNR. They were very clear that they wanted her to have pain control and to be comfortable. Writer addressed pain control and DNR with Dr. Allena KatzPatel. Liquid morphine 10 mg q 4 hrs PRN was ordered and a portable DNR has been signed and placed in patient's chart. Staff RN [name] made aware. Family, CSW [name] and Dr. Allena KatzPatel all made aware there are no beds available at THIS time. Will continue to follow and provide support to patient and her family. Updated notes faxed to hospice referral. Thank you. [name], hospital Liaison [phone] c Dayna BarkerKaren Robertson RN, BSN, [facility]

## 2016-02-24 NOTE — Progress Notes (Signed)
Chaplain was on-call and received a consult from 1C room 108. Provided the ministry of a pastoral presence and a prayer.    02/24/16 1500  Clinical Encounter Type  Visited With Patient and family together  Visit Type Initial;Spiritual support  Referral From Nurse  Spiritual Encounters  Spiritual Needs Prayer;Emotional

## 2016-02-24 NOTE — Progress Notes (Signed)
Pharmacy Antibiotic Note  Nancy York is a 70 y.o. female admitted on 02/24/2016 with sepsis.  Pharmacy has been consulted for vancomycin and cefepime dosing.  Plan: DW 54.2kg  Ve 38L kei 0.05 hr-1  T1/2 14 hours Vancomycin 750 mg q 18 hours ordered with stacked dosing. Level before 5th dose. Goal trough 15-20.  Cefepime 2 grams q 12 hours ordered.  Height: 5\' 5"  (165.1 cm) Weight: 119 lb 6.4 oz (54.2 kg) IBW/kg (Calculated) : 57  Temp (24hrs), Avg:99.8 F (37.7 C), Min:99 F (37.2 C), Max:100.6 F (38.1 C)   Recent Labs Lab 02/22/16 1330 02/24/16 0415  WBC 8.2 18.9*  CREATININE 0.68 0.74  LATICACIDVEN  --  1.4    Estimated Creatinine Clearance: 55.2 mL/min (by C-G formula based on SCr of 0.74 mg/dL).    Allergies  Allergen Reactions  . Statins Other (See Comments)    Reaction:  Leg cramps   . Sulfa Antibiotics Nausea Only  . Other Rash    "leg cramps"  . Penicillins Rash and Other (See Comments)    Has patient had a PCN reaction causing immediate rash, facial/tongue/throat swelling, SOB or lightheadedness with hypotension: No Has patient had a PCN reaction causing severe rash involving mucus membranes or skin necrosis: No Has patient had a PCN reaction that required hospitalization No Has patient had a PCN reaction occurring within the last 10 years: Yes If all of the above answers are "NO", then may proceed with Cephalosporin use.  . Tape Rash    Antimicrobials this admission: vancomycin 11/1 >>  cefepime 11/1 >>   Dose adjustments this admission:   Microbiology results: 11/1 BCx: pending 10/30 UCx: <10K cfu    11/1 UA: LE(+) NO2(-) WBC TNTC  Thank you for allowing pharmacy to be a part of this patient's care.  Ashur Glatfelter S 02/24/2016 6:50 AM

## 2016-02-24 NOTE — ED Provider Notes (Signed)
[facility] Emergency Department Provider Note    First MD Initiated Contact with Patient 02/24/16 [phone]     (approximate)  I have reviewed the triage vital signs and the nursing notes.   HISTORY  Chief Complaint No chief complaint on file.  Level V Caveat: severe sepsis and AMS  HPI Nancy York is a 70 y.o. female with normal comorbidities as described below presents with fever and tachycardia with reported new hypoxia. Patient with mild hypoxia on baseline 4 L nasal cannula. States that she feels sick. She is to does have a cough. No nausea or vomiting.Patient recently admitted for urosepsis and has chronic indwelling Foley catheter. Do not provider by the patient report from the facility since the patient was otherwise being evaluated for placement in hospice due to severe protein calorie nutrition and failure to thrive. Patient became acutely sick today and requested transfer to the ER for evaluation.   Past Medical History:  Diagnosis Date  . Arthritis    joints, shoulder and knees  . Diverticulosis   . GERD (gastroesophageal reflux disease)   . Heart murmur    no doctor follows her for this  . Hypertension     Patient Active Problem List   Diagnosis Date Noted  . Somatization disorder 02/01/2016  . 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 Sepsis (HCC) 01/29/2016  . Hypothermia 01/29/2016  . UTI (urinary tract infection) 01/29/2016  . Hypercapnia 01/29/2016  . Disease of thyroid gland 10/01/2015  . HLD (hyperlipidemia) 10/01/2015  . Pressure ulcer 09/24/2015  . Moderate protein-energy malnutrition (HCC) 08/10/2015  . Neuritis 08/05/2015  . Facial numbness 07/25/2015  . Generalized weakness 07/18/2015  . GERD (gastroesophageal reflux disease) 07/18/2015  . HTN (hypertension) 07/18/2015  . B12 deficiency 08/04/2014    Past Surgical  History:  Procedure Laterality Date  . BILATERAL CARPAL TUNNEL RELEASE Bilateral   . DILATION AND CURETTAGE OF UTERUS    . FRACTURE SURGERY Left 2007   fractured left  hip  . HARDWARE REMOVAL Left 06/24/2015   Procedure: HARDWARE REMOVAL/ DHS;  Surgeon: Donato Heinz, MD;  Location: [facility] ORS;  Service: Orthopedics;  Laterality: Left;  . IRRIGATION AND DEBRIDEMENT ABSCESS Right 09/24/2015   Procedure: IRRIGATION AND DEBRIDEMENT OF RIGHT HIP ABSCESS;  Surgeon: Lattie Haw, MD;  Location: [facility] ORS;  Service: General;  Laterality: Right;  . JOINT REPLACEMENT Right 08/2014   total knee replacement  . KNEE ARTHROSCOPY Left   . ROTATOR CUFF REPAIR Right 2006   Dr. Gerrit Heck   . TONSILLECTOMY    . TOTAL HIP ARTHROPLASTY Left 06/24/2015   Procedure: TOTAL HIP ARTHROPLASTY;  Surgeon: Donato Heinz, MD;  Location: [facility] ORS;  Service: Orthopedics;  Laterality: Left;  . TOTAL KNEE ARTHROPLASTY Right 08/25/2014   Dr. Reita Chard    Prior to Admission medications   Medication Sig Start Date End Date Taking? Authorizing Provider  acetaminophen (TYLENOL) 325 MG tablet Take 1 tablet (325 mg total) by mouth every 6 (six) hours as needed for mild pain (or Fever >/= 101). 07/21/15   Ramonita Lab, MD  aspirin EC 81 MG tablet Take 81 mg by mouth every morning.     Historical Provider, MD  cetirizine (ZYRTEC) 10 MG tablet Take 10 mg by mouth daily as needed for allergies.    Historical Provider, MD  DULoxetine (CYMBALTA) 30 MG capsule Take 1 capsule (30 mg total)  by mouth daily. 02/03/16   Houston SirenVivek J Sainani, MD  levETIRAcetam (KEPPRA) 750 MG tablet Take 750 mg by mouth 2 (two) times daily. 12/01/15   Historical Provider, MD  LORazepam (ATIVAN) 0.5 MG tablet Take 1 tablet (0.5 mg total) by mouth daily as needed for anxiety. Patient not taking: Reported on 01/31/2016 09/26/15   Milagros LollSrikar Sudini, MD  meclizine (ANTIVERT) 25 MG tablet Take 25 mg by mouth 3 (three) times daily as needed for dizziness.    Historical Provider, MD    nystatin (MYCOSTATIN) 100000 UNIT/ML suspension Take 5 mLs by mouth 4 (four) times daily. 12/22/15   Historical Provider, MD  nystatin cream (MYCOSTATIN) Apply 1 application topically daily as needed for dry skin.     Historical Provider, MD  ondansetron (ZOFRAN-ODT) 8 MG disintegrating tablet Take 8 mg by mouth every 8 (eight) hours as needed for nausea or vomiting. Reported on 09/29/2015    Historical Provider, MD  oxyCODONE-acetaminophen (PERCOCET/ROXICET) 5-325 MG tablet Take 1 tablet by mouth every 6 (six) hours as needed for moderate pain. 02/02/16   Houston SirenVivek J Sainani, MD  polyethylene glycol (MIRALAX / GLYCOLAX) packet Take 17 g by mouth daily as needed for mild constipation. 12/28/15   Srikar Sudini, MD  potassium chloride SA (K-DUR,KLOR-CON) 20 MEQ tablet Take 20 mEq by mouth at bedtime. 12/02/15   Historical Provider, MD  RABEprazole (ACIPHEX) 20 MG tablet Take 20 mg by mouth 2 (two) times daily.    Historical Provider, MD  ranitidine (ZANTAC) 150 MG tablet Take 150 mg by mouth 2 (two) times daily as needed for heartburn.     Historical Provider, MD  vitamin B-12 (CYANOCOBALAMIN) 1000 MCG tablet Take 1,000 mcg by mouth every morning.    Historical Provider, MD  Vitamin D, Ergocalciferol, (DRISDOL) 50000 units CAPS capsule Take 50,000 Units by mouth every 7 (seven) days. Pt takes on Sunday.    Historical Provider, MD    Allergies Statins; Sulfa antibiotics; Other; Penicillins; and Tape  Family History  Problem Relation Age of Onset  . Diabetes Father   . Breast cancer Neg Hx     Social History Social History  Substance Use Topics  . Smoking status: Never Smoker  . Smokeless tobacco: Never Used     Comment: no passive smoke in household  . Alcohol use No    Review of Systems Patient denies headaches, rhinorrhea, blurry vision, numbness, shortness of breath, chest pain, edema, cough, abdominal pain, nausea, vomiting, diarrhea, dysuria, fevers, rashes or hallucinations unless otherwise  stated above in HPI. ____________________________________________   PHYSICAL EXAM:  VITAL SIGNS: Vitals:   02/24/16 0709 02/24/16 0730  BP: 104/61 (!) 101/58  Pulse: (!) 103 98  Resp: 16 16  Temp:      Constitutional: Alert but mildly cephalopathic likely secondary to sepsis. Patient chronically ill and appears bedbound. Eyes: Conjunctivae are normal. PERRL. EOMI. Head: Atraumatic. Nose: No congestion/rhinnorhea. Mouth/Throat: Mucous membranes are dry.  Oropharynx non-erythematous. Neck: No stridor. Painless ROM. No cervical spine tenderness to palpation Hematological/Lymphatic/Immunilogical: No cervical lymphadenopathy. Cardiovascular: Tachycardic regular rhythm. Grossly normal heart sounds.  Delayed cap refill Respiratory: Tachypnea with diffuse rhonchorous breath sounds and thick productive cough Gastrointestinal: Soft and nontender. No distention. No abdominal bruits. No CVA tenderness. Genitourinary: Foley catheter in place Musculoskeletal: No lower extremity tenderness nor edema.  No joint effusions. Skin:  Skin is warm, dry and intact. No rash noted.  ____________________________________________   LABS (all labs ordered are listed, but only abnormal results are displayed)  Results for orders placed or performed during the hospital encounter of 02/24/16 (from the past 24 hour(s))  CBC with Differential/Platelet     Status: Abnormal   Collection Time: 02/24/16  4:15 AM  Result Value Ref Range   WBC 18.9 (H) 3.6 - 11.0 K/uL   RBC 4.21 3.80 - 5.20 MIL/uL   Hemoglobin 11.3 (L) 12.0 - 16.0 g/dL   HCT 78.236.1 95.635.0 - 21.347.0 %   MCV 85.9 80.0 - 100.0 fL   MCH 26.7 26.0 - 34.0 pg   MCHC 31.1 (L) 32.0 - 36.0 g/dL   RDW 08.616.8 (H) 57.811.5 - 46.914.5 %   Platelets 643 (H) 150 - 440 K/uL   Neutrophils Relative % 86 %   Neutro Abs 16.2 (H) 1.4 - 6.5 K/uL   Lymphocytes Relative 9 %   Lymphs Abs 1.7 1.0 - 3.6 K/uL   Monocytes Relative 4 %   Monocytes Absolute 0.8 0.2 - 0.9 K/uL    Eosinophils Relative 0 %   Eosinophils Absolute 0.0 0 - 0.7 K/uL   Basophils Relative 1 %   Basophils Absolute 0.1 0 - 0.1 K/uL  Comprehensive metabolic panel     Status: Abnormal   Collection Time: 02/24/16  4:15 AM  Result Value Ref Range   Sodium 140 135 - 145 mmol/L   Potassium 4.6 3.5 - 5.1 mmol/L   Chloride 100 (L) 101 - 111 mmol/L   CO2 28 22 - 32 mmol/L   Glucose, Bld 106 (H) 65 - 99 mg/dL   BUN 27 (H) 6 - 20 mg/dL   Creatinine, Ser 6.290.74 0.44 - 1.00 mg/dL   Calcium 52.811.6 (H) 8.9 - 10.3 mg/dL   Total Protein 7.1 6.5 - 8.1 g/dL   Albumin 2.8 (L) 3.5 - 5.0 g/dL   AST 26 15 - 41 U/L   ALT 12 (L) 14 - 54 U/L   Alkaline Phosphatase 134 (H) 38 - 126 U/L   Total Bilirubin 0.9 0.3 - 1.2 mg/dL   GFR calc non Af Amer >60 >60 mL/min   GFR calc Af Amer >60 >60 mL/min   Anion gap 12 5 - 15  Troponin I     Status: Abnormal   Collection Time: 02/24/16  4:15 AM  Result Value Ref Range   Troponin I 0.39 (HH) <0.03 ng/mL  Brain natriuretic peptide     Status: None   Collection Time: 02/24/16  4:15 AM  Result Value Ref Range   B Natriuretic Peptide 41.0 0.0 - 100.0 pg/mL  Magnesium     Status: None   Collection Time: 02/24/16  4:15 AM  Result Value Ref Range   Magnesium 1.7 1.7 - 2.4 mg/dL  Lactic acid, plasma     Status: None   Collection Time: 02/24/16  4:15 AM  Result Value Ref Range   Lactic Acid, Venous 1.4 0.5 - 1.9 mmol/L  Blood Culture (routine x 2)     Status: None (Preliminary result)   Collection Time: 02/24/16  4:16 AM  Result Value Ref Range   Specimen Description BLOOD RIGHT ANTECUBITAL    Special Requests BOTTLES DRAWN AEROBIC AND ANAEROBIC 5CC    Culture NO GROWTH < 12 HOURS    Report Status PENDING   Urinalysis complete, with microscopic ([facility] only)     Status: Abnormal   Collection Time: 02/24/16  4:38 AM  Result Value Ref Range   Color, Urine AMBER (A) YELLOW   APPearance CLOUDY (A) CLEAR   Glucose, UA NEGATIVE NEGATIVE  mg/dL   Bilirubin Urine NEGATIVE  NEGATIVE   Ketones, ur 1+ (A) NEGATIVE mg/dL   Specific Gravity, Urine 1.029 1.005 - 1.030   Hgb urine dipstick 1+ (A) NEGATIVE   pH 5.0 5.0 - 8.0   Protein, ur 100 (A) NEGATIVE mg/dL   Nitrite NEGATIVE NEGATIVE   Leukocytes, UA 3+ (A) NEGATIVE   RBC / HPF 6-30 0 - 5 RBC/hpf   WBC, UA TOO NUMEROUS TO COUNT 0 - 5 WBC/hpf   Bacteria, UA RARE (A) NONE SEEN   Squamous Epithelial / LPF 0-5 (A) NONE SEEN   Mucous PRESENT    Hyaline Casts, UA PRESENT   Blood Culture (routine x 2)     Status: None (Preliminary result)   Collection Time: 02/24/16  4:39 AM  Result Value Ref Range   Specimen Description BLOOD LEFT ANTECUBITAL    Special Requests BOTTLES DRAWN AEROBIC AND ANAEROBIC 6CC    Culture NO GROWTH < 12 HOURS    Report Status PENDING   Influenza panel by PCR (type A & B, H1N1)     Status: None   Collection Time: 02/24/16  5:11 AM  Result Value Ref Range   Influenza A By PCR NEGATIVE NEGATIVE   Influenza B By PCR NEGATIVE NEGATIVE   ____________________________________________  EKG My review and personal interpretation at Time: 3:51   Indication: sepsis  Rate: 130  Rhythm: sinus Axis: normal Other: non specific st changes, no acute ischemia ____________________________________________  RADIOLOGY  I personally reviewed all radiographic images ordered to evaluate for the above acute complaints and reviewed radiology reports and findings.  These findings were personally discussed with the patient.  Please see medical record for radiology report. ____________________________________________   PROCEDURES  Procedure(s) performed: none    Critical Care performed: yes CRITICAL CARE Performed by: Willy Eddy   Total critical care time: 40 minutes  Critical care time was exclusive of separately billable procedures and treating other patients.  Critical care was necessary to treat or prevent imminent or life-threatening deterioration.  Critical care was time spent  personally by me on the following activities: development of treatment plan with patient and/or surrogate as well as nursing, discussions with consultants, evaluation of patient's response to treatment, examination of patient, obtaining history from patient or surrogate, ordering and performing treatments and interventions, ordering and review of laboratory studies, ordering and review of radiographic studies, pulse oximetry and re-evaluation of patient's condition.  ____________________________________________   INITIAL IMPRESSION / ASSESSMENT AND PLAN / ED COURSE  Pertinent labs & imaging results that were available during my care of the patient were reviewed by me and considered in my medical decision making (see chart for details).  DDX: pna, uti, flu, bacteremia, dehydration, enteritits, cellulitis  Nancy York is a 70 y.o. who presents to the ED with severe sepsis. Patient febrile and tachycardic but normotensive. Patient appears critically ill upon arrival and states that she does wish to be a full code. Her abdominal exam is soft and nontender therefore do not suspect intra-abdominal process. Chest x-ray shows no significant lobar consolidation but was a limited study due to patient's habitus. Based on her lung sounds and exams I am suspicious for pneumonia. We will order laboratory evaluation to try to identify source of infection. We'll provide IV fluids for resuscitation.The patient will be placed on continuous pulse oximetry and telemetry for monitoring.  Laboratory evaluation will be sent to evaluate for the above complaints.     Clinical Course  Comment By  Time  Patient reassessed. Denies any chest pain at this time. Troponin is elevated to 0.39. Likely demand ischemia in the setting of acute sepsis. Further questioning patient has not had any recent diarrhea. UA with chronic indwelling Foley certainly a possible source. Patient also with reportedly productive cough and told that  she had bronchitis 3 days ago. Chest x-ray does not show any evidence of lobar pneumonia, however patient is having new hypoxia.  Tachycardia improving with IV resuscitation. She does not have a lactic acidosis. IV antibiotics are infusing. 2. Abdominal exam is reassuring.  Willy Eddy, MD 11/01 [phone]     ____________________________________________   FINAL CLINICAL IMPRESSION(S) / ED DIAGNOSES  Final diagnoses:  Severe sepsis (HCC)  Elevated troponin      NEW MEDICATIONS STARTED DURING THIS VISIT:  New Prescriptions   No medications on file     Note:  This document was prepared using Dragon voice recognition software and may include unintentional dictation errors.    Willy Eddy, MD 02/24/16 [phone]

## 2016-02-24 NOTE — ED Notes (Signed)
MD aware of vs after bolus

## 2016-02-24 NOTE — Progress Notes (Signed)
[facility] [place] [facility]   PATIENT NAME: Nancy SchaumannSandra York    MR#:  [id number]  DATE OF BIRTH:  Dec 09, 1945  SUBJECTIVE:  Came in with sob, cough and severe back pain. dter [place] Gully/HCPOA in the room. Pt moaning with pain Spoke with dter at length and wants mom to be comfortable  REVIEW OF SYSTEMS:   Review of Systems  Unable to perform ROS: Severe respiratory distress   Tolerating Diet:some Tolerating PT: no  DRUG ALLERGIES:   Allergies  Allergen Reactions  . Statins Other (See Comments)    Reaction:  Leg cramps   . Sulfa Antibiotics Nausea Only  . Other Rash    "leg cramps"  . Penicillins Rash and Other (See Comments)    Has patient had a PCN reaction causing immediate rash, facial/tongue/throat swelling, SOB or lightheadedness with hypotension: No Has patient had a PCN reaction causing severe rash involving mucus membranes or skin necrosis: No Has patient had a PCN reaction that required hospitalization No Has patient had a PCN reaction occurring within the last 10 years: Yes If all of the above answers are "NO", then may proceed with Cephalosporin use.  . Tape Rash    VITALS:  Blood pressure (!) 112/55, pulse (!) 111, temperature 99.9 F (37.7 C), resp. rate 18, height 5\' 2"  (1.575 m), weight 50.6 kg (111 lb 9.6 oz), SpO2 91 %.  PHYSICAL EXAMINATION:   Physical Exam  GENERAL:  70 y.o.-year-old patient lying in the bed with no acute distress. Chronically ill. Cachectic, thin EYES: Pupils equal, round, reactive to light and accommodation. No scleral icterus. Extraocular muscles intact.  HEENT: Head atraumatic, normocephalic. Oropharynx and nasopharynx clear.  NECK:  Supple, no jugular venous distention. No thyroid enlargement, no tenderness.  LUNGS: coarse breath sounds bilaterally, no wheezing, rales, rhonchi. No use of accessory muscles of respiration.  CARDIOVASCULAR: S1, S2 normal. No murmurs, rubs, or gallops.  ABDOMEN: Soft, nontender,  nondistended. Bowel sounds present. No organomegaly or mass.  EXTREMITIES: No cyanosis, clubbing or edema b/l.    NEUROLOGIC:unable to assess. Grossly non focal PSYCHIATRIC:  patient is alert SKIN: No obvious rash, lesion, or ulcer.   LABORATORY PANEL:  CBC  Recent Labs Lab 02/24/16 0415  WBC 18.9*  HGB 11.3*  HCT 36.1  PLT 643*    Chemistries   Recent Labs Lab 02/24/16 0415  NA 140  K 4.6  CL 100*  CO2 28  GLUCOSE 106*  BUN 27*  CREATININE 0.74  CALCIUM 11.6*  MG 1.7  AST 26  ALT 12*  ALKPHOS 134*  BILITOT 0.9   Cardiac Enzymes  Recent Labs Lab 02/24/16 0916  TROPONINI 0.59*   RADIOLOGY:  Dg Chest Port 1 View  Result Date: 02/24/2016 CLINICAL DATA:  70 y/o F; sepsis and concern for pneumonia. History of congestive heart failure appear EXAM: PORTABLE CHEST 1 VIEW COMPARISON:  01/29/2016 chest radiograph. FINDINGS: Stable cardiac silhouette given patient rotation and technique. Interval mid thoracic posterior surgical hardware noted. Multilevel degenerative changes of the thoracic spine. Right hemidiaphragm elevation and prominence of right hilar markings, probably accentuated by patient's rotation. Right lung base poorly visualized. No definite focal consolidation, effusion, or pneumothorax. IMPRESSION: Right hemidiaphragm elevation and prominence of right hilar markings, probably accentuated by patient's rotation. Right lung base is poorly visualized. No definite focal consolidation, effusion, or pneumothorax. If clinically indicated consider PA and lateral chest radiographs if patient is able or chest CT for better visualization of the right lung base.  Electronically Signed   By: Mitzi HansenLance  Furusawa-Stratton M.D.   On: 02/24/2016 04:16   ASSESSMENT AND PLAN:  70 year old female patient with history of arthritis, hypertension, GERD presented to the emergency room with the difficulty breathing and dysuria. Patient has chronic indwelling Foley catheter.  1. Sepsis due to  Urinary tract infection -was on vanc and cefepime---change to IV rocephin -d/w dter pt is DNR and under Palliative care and plan was to transfer to Hospice facility when bed opens up however with some miscommunication with the staff got transferred to ER -pt is progressively declining and severe back pain due to spinal stenosis -I had a long onverstiaon with dter and she understand pt's poor prognosis. Will keep her comfortable with m,orphine and ativan and d/c iv abxs in am. dter is ok to stop po meds also. Pt will go to hospice home once bed opens up. -seen by Hospice liason Clydie BraunKaren in the hospital  3. Abnormal troponin which could be secondary to demand ischemia  4. Hypertension  5. Hyponatremia IVF was given in the ER  Overall poor prognosis  Case discussed with Care Management/Social Worker. Management plans discussed with the patient, family and they are in agreement.  CODE STATUS: DNR  DVT Prophylaxis: lovenox  TOTAL TIME TAKING CARE OF THIS PATIENT: 40 minutes.  >50% time spent on counselling and coordination of care dter and hospice RN  POSSIBLE D/C IN 1-2 DAYS, DEPENDING ON CLINICAL CONDITION.  Note: This dictation was prepared with Dragon dictation along with smaller phrase technology. Any transcriptional errors that result from this process are unintentional.  Gresham Caetano M.D on 02/24/2016 at 4:40 PM  Between 7am to 6pm - Pager - [phone]  After 6pm go to [email or web address] - password [email or web address] [facility]  [facility]  Office  [phone]  CC: Primary care physician; Marguarite ArbourSPARKS,JEFFREY D, MD

## 2016-02-25 DIAGNOSIS — L8990 Pressure ulcer of unspecified site, unspecified stage: Secondary | ICD-10-CM | POA: Insufficient documentation

## 2016-02-25 MED ORDER — MORPHINE SULFATE (CONCENTRATE) 10 MG/0.5ML PO SOLN: 5.0000 mg | ORAL | 0 refills | Status: AC | PRN

## 2016-02-25 MED ORDER — LORAZEPAM 0.5 MG PO TABS: 0.5000 mg | ORAL_TABLET | ORAL | 0 refills | Status: AC | PRN

## 2016-02-25 NOTE — Clinical Social Work Note (Signed)
Patient to discharge today to the hospice home. Karen with [facility] is making those arrangements.  York SpanielMonica Nieshia Larmon MSW,LCSW [phone]

## 2016-02-25 NOTE — Progress Notes (Signed)
Follow up visit made to new hospice home referral. Writer contacted patient's daughter Nancy York via telephone this morning to advise her that there was a bed available. Hospital care team made aware. Writer met with [facility] to initiate education regarding hospice services, philosophy and team approach to care with good understanding voiced. Consents signed. Updated notes and consents faxed to the hospice home, report called. EMS notified for transport.Signed DNR in place in discharge packet. Thank you.  Flo Shanks RN, BSN, [facility] [place], hospital Liaison [phone] c

## 2016-02-25 NOTE — Care Management Important Message (Signed)
Important Message  Patient Details  Name: Nancy BlakeSandra S York MRN: [id number] Date of Birth: 04/18/1946   Medicare Important Message Given:  Yes    Gwenette GreetBrenda S Lylla Eifler, RN 02/25/2016, 8:54 AM

## 2016-02-25 NOTE — Progress Notes (Signed)
Pt has being discharged to hospice home by EMS. Morphine given by charge nurse prior to discharge.

## 2016-02-25 NOTE — Discharge Summary (Signed)
[facility] [place] [facility]   PATIENT NAME: Nancy SchaumannSandra York    MR#:  [id number]  DATE OF BIRTH:  1946-01-19  DATE OF ADMISSION:  02/24/2016 ADMITTING PHYSICIAN: Ihor AustinPavan Pyreddy, MD  DATE OF DISCHARGE: 02/25/16  PRIMARY CARE PHYSICIAN: SPARKS,JEFFREY D, MD    ADMISSION DIAGNOSIS:  Elevated troponin [R74.8] Severe sepsis (HCC) [A41.9, R65.20]  DISCHARGE DIAGNOSIS:  Failure to thrive  SECONDARY DIAGNOSIS:   Past Medical History:  Diagnosis Date  . Arthritis    joints, shoulder and knees  . Diverticulosis   . GERD (gastroesophageal reflux disease)   . Heart murmur    no doctor follows her for this  . Hypertension     HOSPITAL COURSE:  70 year old female patient with history of arthritis, hypertension, GERD presented to the emergency room with the difficulty breathing and dysuria.Patient has chronic indwelling Foley catheter.  1.Sepsis due to Urinary tract infection -was on vanc and cefepime---change to IV rocephin -d/w dter pt is DNR and under Palliative care and plan was to transfer to Hospice facility when bed opens up however with some miscommunication with the staff got transferred to ER -pt is progressively declining and severe back pain due to spinal stenosis -pt is comfort care and will transition to Hospice facility  2.failure to thrive  Overall poor prognosis  CONSULTS OBTAINED:    DRUG ALLERGIES:   Allergies  Allergen Reactions  . Statins Other (See Comments)    Reaction:  Leg cramps   . Sulfa Antibiotics Nausea Only  . Other Rash    "leg cramps"  . Penicillins Rash and Other (See Comments)    Has patient had a PCN reaction causing immediate rash, facial/tongue/throat swelling, SOB or lightheadedness with hypotension: No Has patient had a PCN reaction causing severe rash involving mucus membranes or skin necrosis: No Has patient had a PCN reaction that required hospitalization No Has patient had a PCN reaction  occurring within the last 10 years: Yes If all of the above answers are "NO", then may proceed with Cephalosporin use.  . Tape Rash    DISCHARGE MEDICATIONS:   Current Discharge Medication List    CONTINUE these medications which have CHANGED   Details  LORazepam (ATIVAN) 0.5 MG tablet Take 1 tablet (0.5 mg total) by mouth every 4 (four) hours as needed for anxiety. Qty: 30 tablet, Refills: 0    Morphine Sulfate (MORPHINE CONCENTRATE) 10 MG/0.5ML SOLN concentrated solution Take 0.25 mLs (5 mg total) by mouth every 2 (two) hours as needed for moderate pain, severe pain or shortness of breath. Qty: 180 mL, Refills: 0      STOP taking these medications     acetaminophen (TYLENOL) 500 MG tablet      aspirin EC 81 MG tablet      bisacodyl (DULCOLAX) 5 MG EC tablet      calcium-vitamin D (OSCAL WITH D) 250-125 MG-UNIT tablet      cetirizine (ZYRTEC) 10 MG tablet      dicyclomine (BENTYL) 20 MG tablet      fluconazole (DIFLUCAN) 100 MG tablet      Glucosamine-Chondroitin (OSTEO BI-FLEX REGULAR STRENGTH) 250-200 MG TABS      levETIRAcetam (KEPPRA) 750 MG tablet      megestrol (MEGACE) 400 MG/10ML suspension      metoprolol succinate (TOPROL-XL) 50 MG 24 hr tablet      nystatin (MYCOSTATIN) 100000 UNIT/ML suspension      ondansetron (ZOFRAN-ODT) 8 MG disintegrating tablet  oxyCODONE (OXY IR/ROXICODONE) 5 MG immediate release tablet      polyethylene glycol (MIRALAX / GLYCOLAX) packet      ranitidine (ZANTAC) 150 MG tablet      sennosides-docusate sodium (SENOKOT-S) 8.6-50 MG tablet      thiamine 100 MG tablet      vitamin B-12 (CYANOCOBALAMIN) 1000 MCG tablet         If you experience worsening of your admission symptoms, develop shortness of breath, life threatening emergency, suicidal or homicidal thoughts you must seek medical attention immediately by calling 911 or calling your MD immediately  if symptoms less severe.  You Must read complete  instructions/literature along with all the possible adverse reactions/side effects for all the Medicines you take and that have been prescribed to you. Take any new Medicines after you have completely understood and accept all the possible adverse reactions/side effects.   Please note  You were cared for by a hospitalist during your hospital stay. If you have any questions about your discharge medications or the care you received while you were in the hospital after you are discharged, you can call the unit and asked to speak with the hospitalist on call if the hospitalist that took care of you is not available. Once you are discharged, your primary care physician will handle any further medical issues. Please note that NO REFILLS for any discharge medications will be authorized once you are discharged, as it is imperative that you return to your primary care physician (or establish a relationship with a primary care physician if you do not have one) for your aftercare needs so that they can reassess your need for medications and monitor your lab values. Today   SUBJECTIVE   Moaning   VITAL SIGNS:  Blood pressure 122/66, pulse (!) 115, temperature 98.8 F (37.1 C), temperature source Oral, resp. rate 18, height 5\' 2"  (1.575 m), weight 50.6 kg (111 lb 9.6 oz), SpO2 91 %.  I/O:   Intake/Output Summary (Last 24 hours) at 02/25/16 1037 Last data filed at 02/25/16 0437  Gross per 24 hour  Intake          1073.75 ml  Output              400 ml  Net           673.75 ml    PHYSICAL EXAMINATION:  GENERAL:  70 y.o.-year-old patient lying in the bed with no acute distress. Chronically ill EYES: Pupils equal, round, reactive to light and accommodation. No scleral icterus. Extraocular muscles intact.  HEENT: Head atraumatic, normocephalic. Oropharynx and nasopharynx clear.  NECK:  Supple, no jugular venous distention. No thyroid enlargement, no tenderness.  LUNGS: coarse breath sounds bilaterally,  no wheezing, rales,rhonchi or crepitation. No use of accessory muscles of respiration.  CARDIOVASCULAR: S1, S2 normal. No murmurs, rubs, or gallops.  ABDOMEN: Soft, non-tender, non-distended. Bowel sounds present. No organomegaly or mass.  EXTREMITIES: No pedal edema, cyanosis, or clubbing.  NEUROLOGIC:unable to assess  PSYCHIATRIC: moaning SKIN: No obvious rash, lesion, or ulcer.   DATA REVIEW:   CBC   Recent Labs Lab 02/24/16 0415  WBC 18.9*  HGB 11.3*  HCT 36.1  PLT 643*    Chemistries   Recent Labs Lab 02/24/16 0415  NA 140  K 4.6  CL 100*  CO2 28  GLUCOSE 106*  BUN 27*  CREATININE 0.74  CALCIUM 11.6*  MG 1.7  AST 26  ALT 12*  ALKPHOS 134*  BILITOT 0.9  Microbiology Results   Recent Results (from the past 240 hour(s))  Urine culture     Status: Abnormal   Collection Time: 02/19/16  5:10 PM  Result Value Ref Range Status   Specimen Description URINE, RANDOM  Final   Special Requests NONE  Final   Culture MULTIPLE SPECIES PRESENT, SUGGEST RECOLLECTION (A)  Final   Report Status 02/21/2016 FINAL  Final  Urine culture     Status: Abnormal   Collection Time: 02/22/16  1:30 PM  Result Value Ref Range Status   Specimen Description URINE, RANDOM  Final   Special Requests NONE  Final   Culture (A)  Final    <10,000 COLONIES/mL INSIGNIFICANT GROWTH Performed at [facility]    Report Status 02/23/2016 FINAL  Final  Blood Culture (routine x 2)     Status: None (Preliminary result)   Collection Time: 02/24/16  4:16 AM  Result Value Ref Range Status   Specimen Description BLOOD RIGHT ANTECUBITAL  Final   Special Requests BOTTLES DRAWN AEROBIC AND ANAEROBIC 5CC  Final   Culture NO GROWTH 1 DAY  Final   Report Status PENDING  Incomplete  Blood Culture (routine x 2)     Status: None (Preliminary result)   Collection Time: 02/24/16  4:39 AM  Result Value Ref Range Status   Specimen Description BLOOD LEFT ANTECUBITAL  Final   Special Requests BOTTLES  DRAWN AEROBIC AND ANAEROBIC 6CC  Final   Culture NO GROWTH 1 DAY  Final   Report Status PENDING  Incomplete    RADIOLOGY:  Dg Chest Port 1 View  Result Date: 02/24/2016 CLINICAL DATA:  70 y/o F; sepsis and concern for pneumonia. History of congestive heart failure appear EXAM: PORTABLE CHEST 1 VIEW COMPARISON:  01/29/2016 chest radiograph. FINDINGS: Stable cardiac silhouette given patient rotation and technique. Interval mid thoracic posterior surgical hardware noted. Multilevel degenerative changes of the thoracic spine. Right hemidiaphragm elevation and prominence of right hilar markings, probably accentuated by patient's rotation. Right lung base poorly visualized. No definite focal consolidation, effusion, or pneumothorax. IMPRESSION: Right hemidiaphragm elevation and prominence of right hilar markings, probably accentuated by patient's rotation. Right lung base is poorly visualized. No definite focal consolidation, effusion, or pneumothorax. If clinically indicated consider PA and lateral chest radiographs if patient is able or chest CT for better visualization of the right lung base. Electronically Signed   By: Mitzi Hansen M.D.   On: 02/24/2016 04:16     Management plans discussed with the patient, family and they are in agreement.  CODE STATUS:     Code Status Orders        Start     Ordered   02/24/16 1009  Do not attempt resuscitation (DNR)  Continuous    Question Answer Comment  In the event of cardiac or respiratory ARREST Do not call a "code blue"   In the event of cardiac or respiratory ARREST Do not perform Intubation, CPR, defibrillation or ACLS   In the event of cardiac or respiratory ARREST Use medication by any route, position, wound care, and other measures to relive pain and suffering. May use oxygen, suction and manual treatment of airway obstruction as needed for comfort.      02/24/16 1008    Code Status History    Date Active Date Inactive Code  Status Order ID Comments User Context   02/24/2016  8:31 AM 02/24/2016 10:08 AM Full Code [id number]  Ihor Austin, MD Inpatient   01/29/2016  7:46  PM 02/02/2016  9:44 PM Full Code [id number]  Katharina Caperima Vaickute, MD Inpatient   12/26/2015  2:21 PM 12/28/2015  5:30 PM Full Code [id number]  Gracelyn NurseJohn D Johnston, MD Inpatient   09/22/2015  8:13 PM 09/26/2015  7:45 PM Full Code [id number]  Adrian SaranSital Mody, MD Inpatient   07/18/2015 10:41 PM 07/21/2015  7:44 PM Full Code [id number]  Oralia Manisavid Willis, MD Inpatient   08/25/2014  7:10 PM 08/28/2014  3:08 PM Full Code [id number]  Donato HeinzJames P Hooten, MD Inpatient   08/25/2014  7:10 PM 08/25/2014  7:10 PM Full Code [id number]  Donato HeinzJames P Hooten, MD Inpatient      TOTAL TIME TAKING CARE OF THIS PATIENT: 40 minutes.    Shareta Fishbaugh M.D on 02/25/2016 at 10:37 AM  Between 7am to 6pm - Pager - [phone] After 6pm go to [email or web address] - password [email or web address]  [facility]  Office  [phone]  CC: Primary care physician; Marguarite ArbourSPARKS,JEFFREY D, MD

## 2016-02-29 LAB — CULTURE, BLOOD (ROUTINE X 2)
CULTURE: NO GROWTH
CULTURE: NO GROWTH

## 2016-03-25 DEATH — deceased

## 2017-03-27 IMAGING — CR DG KNEE 1-2V*R*
1 series · 2 of 2 positions shown · non-contrast
Comparison: None.

CLINICAL DATA: Knee osteoarthritis. Postop from right knee
arthroplasty.

EXAM:
RIGHT KNEE - 1-2 VIEW

[Series 1: ap · 0.17mm/px · 2 of 2 slices shown]
[im 1/2]
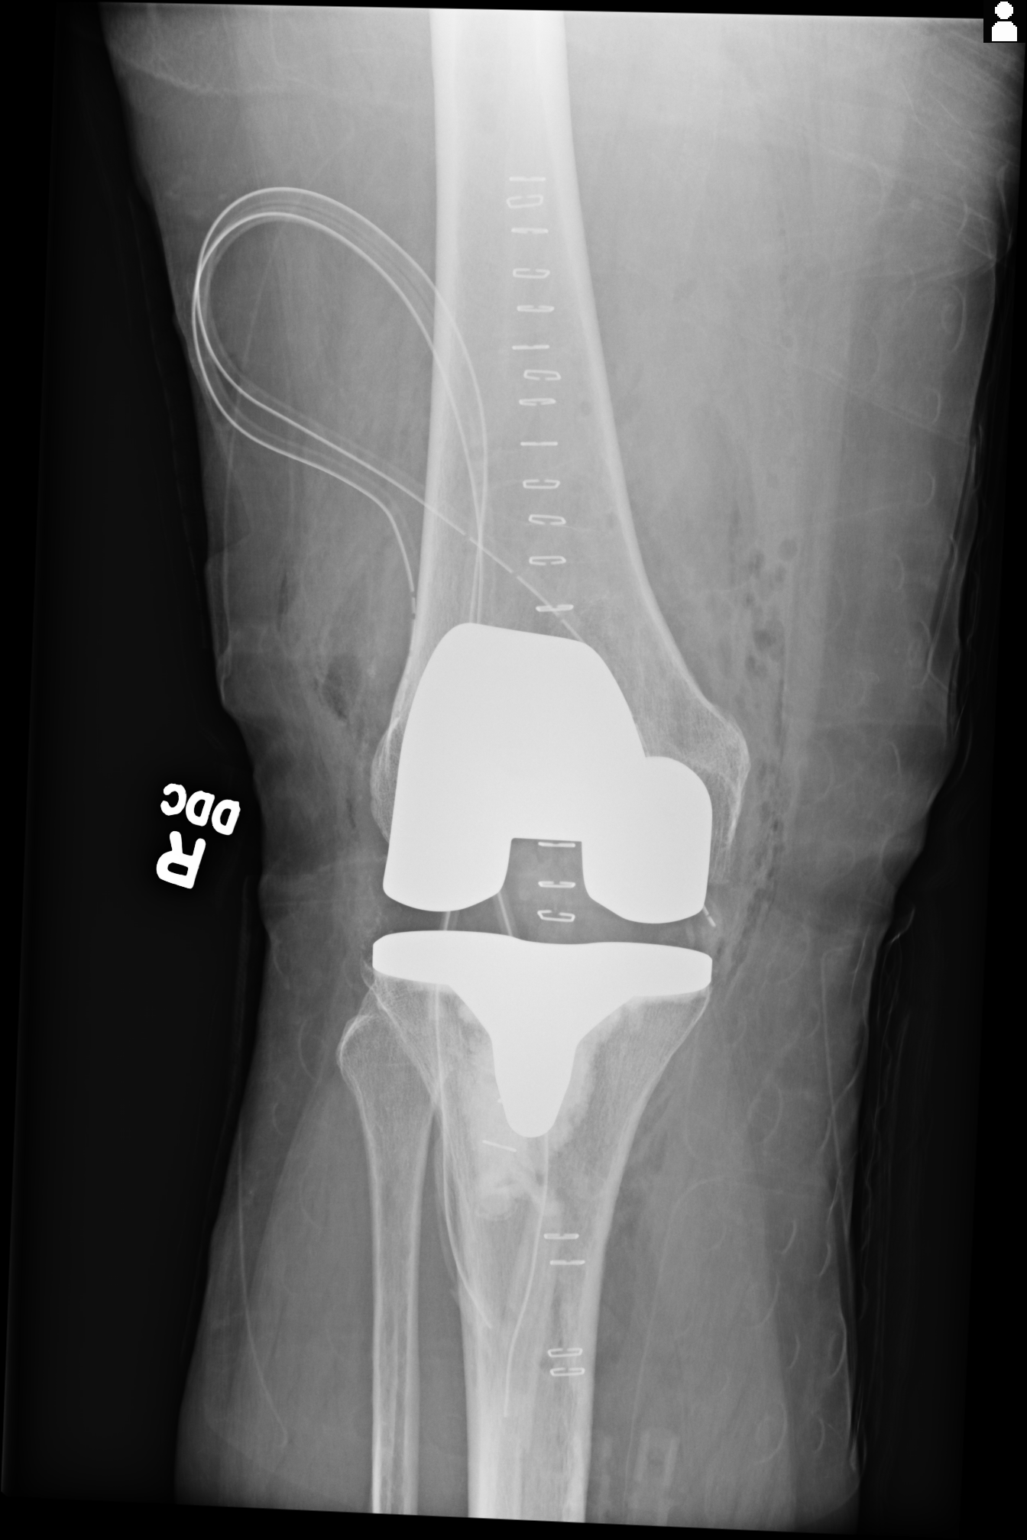
[im 2/2]
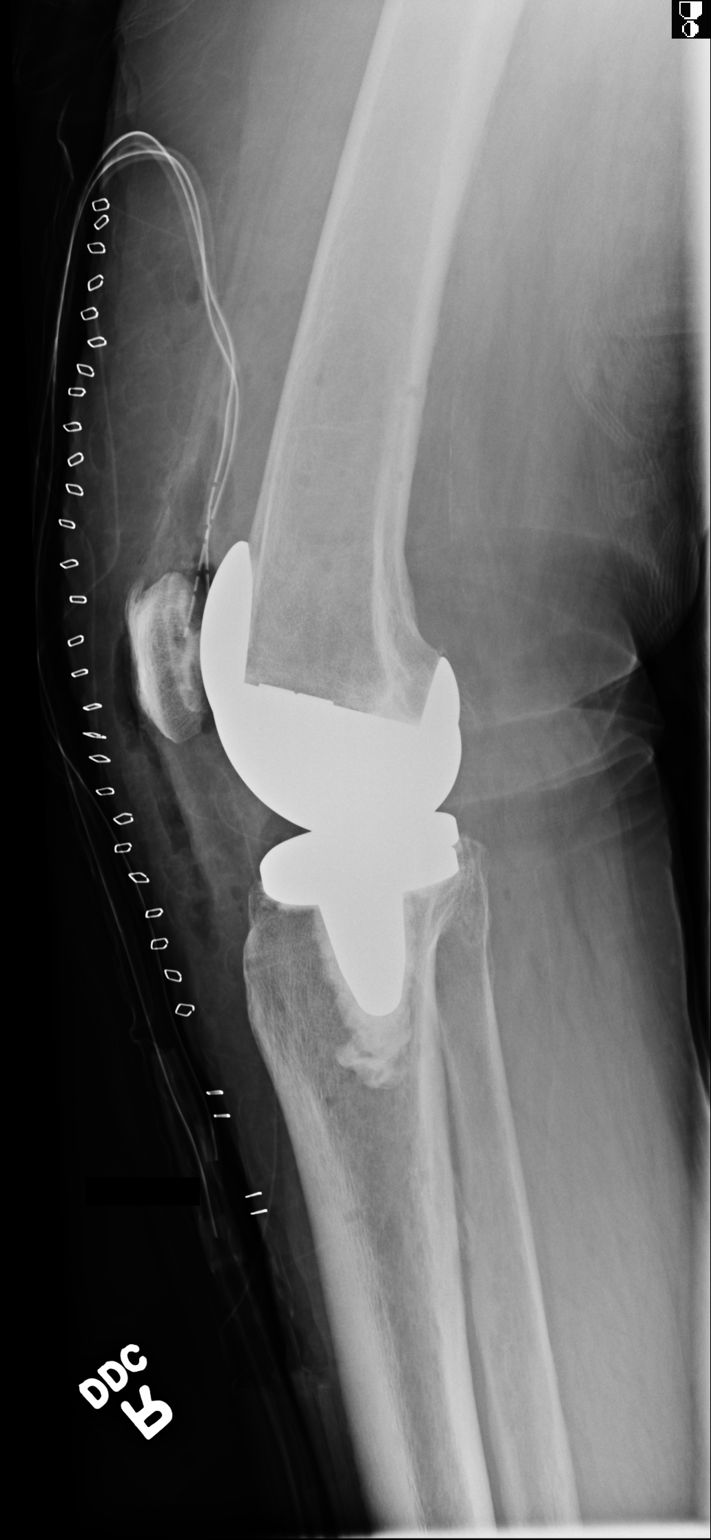

[2 of 2 positions shown; findings below may reference images not displayed]

FINDINGS: Right knee arthroplasty is seen with all 3 components in expected
position. No evidence of fracture or dislocation. Surgical drains
and skin staples are seen in place.
IMPRESSION: Expected postoperative appearance of total right knee arthroplasty.
No evidence of fracture or other complication.

## 2018-02-20 IMAGING — CT CT NECK W/ CM
2 of 3 series · 7 of 14 positions shown, 8 images · IV contrast (iopamidol)
Comparison: None.

CLINICAL DATA: 69-year-old hypertensive female presenting with
paresthesias entire head. Tenderness around eyes and jaw. Stuffiness
and with some of nose bleeding upon attempting blowing nose. Sore
throat when swallowing for 5 days. Initial encounter.

EXAM:
CT NECK WITH CONTRAST
TECHNIQUE: Multidetector CT imaging of the neck was performed using the
standard protocol following the bolus administration of intravenous
contrast.
CONTRAST:  75mL [id number] IOPAMIDOL ([id number]) INJECTION 61%

[Series 2: axial neck · axial · 0.61mm/px · z∈[-186,-74]mm · 3 of 113 slices shown]
[im 29/113  bone]
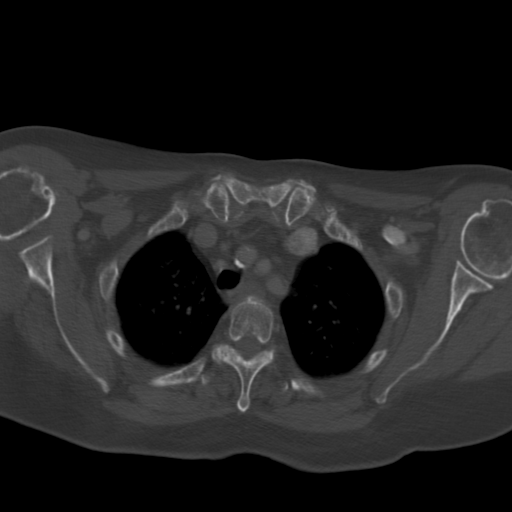
[im 57/113  bone]
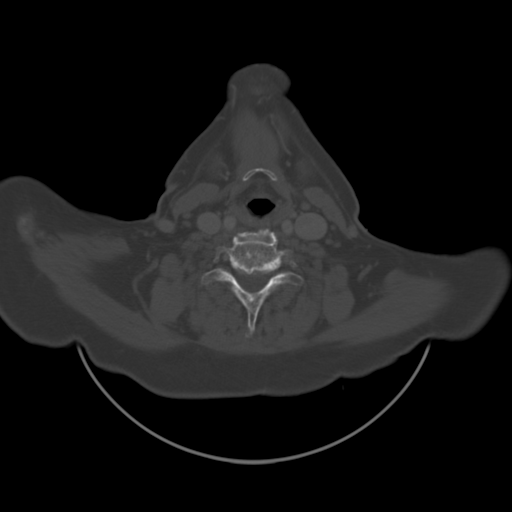
[im 85/113  bone]
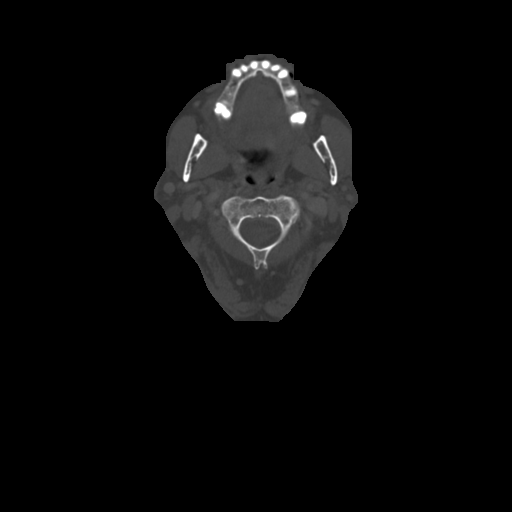

[Series 7: ax oropharynx · axial · 0.35mm/px · z∈[-251,-101]mm · 4 of 141 slices shown, 5 images]
[im 29/141  soft-tissue]
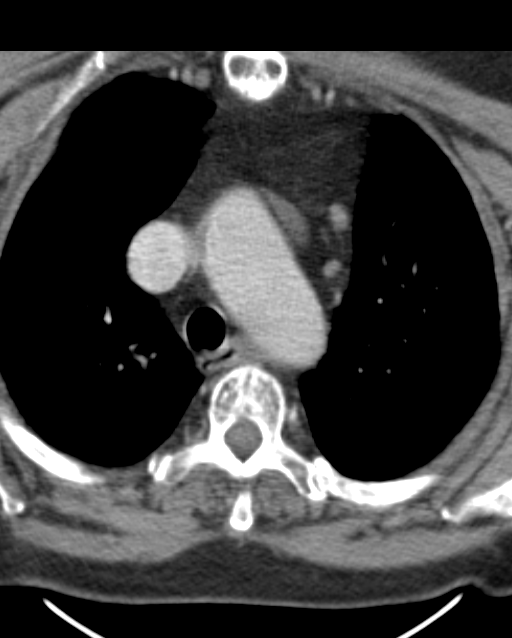
[im 29/141  bone]
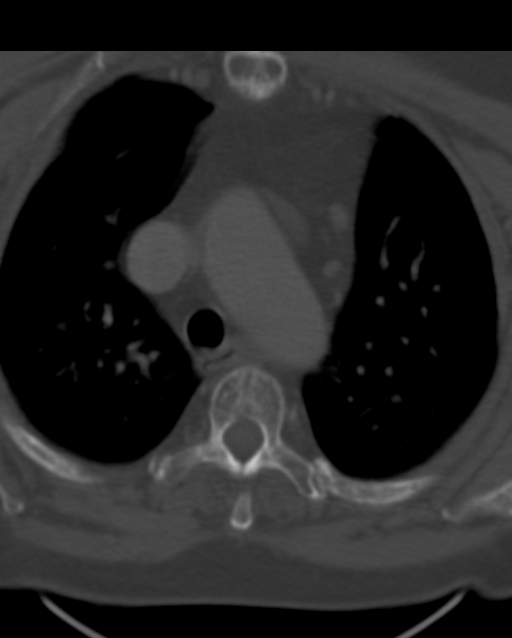
[im 57/141  bone]
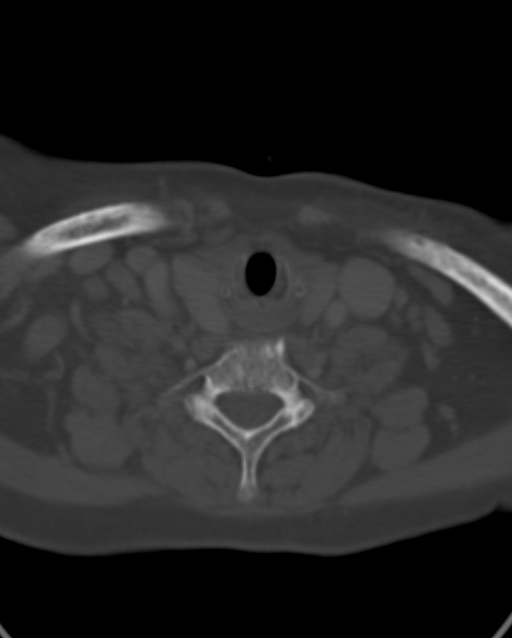
[im 85/141  bone]
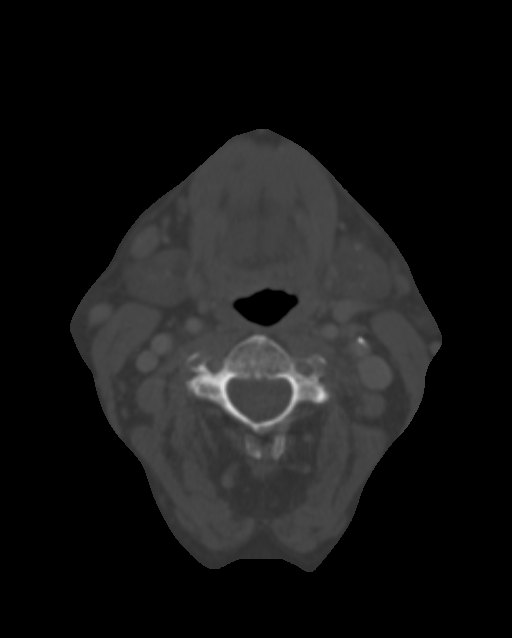
[im 113/141  bone]
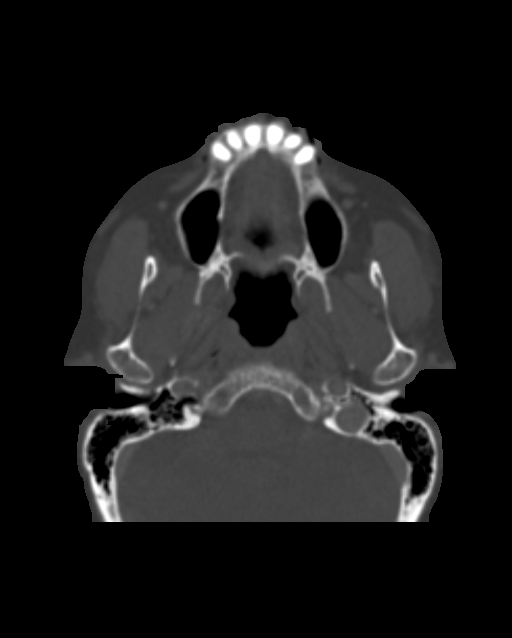

[7 of 14 positions shown; findings below may reference images not displayed]

FINDINGS: Pharynx and larynx: Slight asymmetry of vallecula with fullness on
the left without mass identified.

Salivary glands: Left parotid gland is smaller and slightly dense
compared to the right which may reflect result of prior
inflammation. No mass identified.

Thyroid: No mass noted.

Lymph nodes: Scattered normal/top-normal size lymph nodes most
prominent right supraclavicular region with short axis dimension
less than 1 cm. Asymmetric lymph nodes anterior to the submandibular
gland larger on the right. This lymph node does not appear necrotic.
Slightly rounded normal size lymph node left level 2 region without
necrosis. Overall, lymph nodes do not appear pathologic.

Vascular: Calcified plaque aortic arch, origin great vessels and
carotid bifurcation narrowing of the carotid bifurcation and
proximal internal carotid artery greater on the left (less than 70%
diameter stenosis).

Limited intracranial: Negative.

Visualized orbits: Partially imaged and unremarkable.

Mastoids and visualized paranasal sinuses: Clear.

Skeleton: Cervical spondylotic changes most prominent C4-5 thru
C6-7. Various degrees of spinal stenosis and foraminal narrowing.

Upper chest: Visualized upper lung zones are clear.
IMPRESSION: Slight asymmetry of vallecula with fullness on the left without mass
identified.

Left parotid gland is smaller and slightly dense compared to the
right which may reflect result of prior inflammation.

Scattered normal/top-normal size lymph nodes. Overall, lymph nodes
do not appear pathologic.

Calcified plaque carotid bifurcation and proximal internal carotid
artery greater on the left (less than 70% diameter stenosis).

Cervical spondylotic changes most prominent C4-5 thru C6-7.
